# Patient Record
Sex: Female | Born: 1940 | Race: Black or African American | Hispanic: No | Smoking: Former smoker
Health system: Southern US, Community
[De-identification: ages and names within clinical notes are randomized; demographics above are authoritative.]

## PROBLEM LIST (undated history)

## (undated) DIAGNOSIS — I480 Paroxysmal atrial fibrillation: Secondary | ICD-10-CM

## (undated) DIAGNOSIS — K219 Gastro-esophageal reflux disease without esophagitis: Secondary | ICD-10-CM

## (undated) DIAGNOSIS — F329 Major depressive disorder, single episode, unspecified: Secondary | ICD-10-CM

## (undated) DIAGNOSIS — I10 Essential (primary) hypertension: Secondary | ICD-10-CM

## (undated) DIAGNOSIS — F32A Depression, unspecified: Secondary | ICD-10-CM

## (undated) DIAGNOSIS — I50810 Right heart failure, unspecified: Secondary | ICD-10-CM

## (undated) HISTORY — PX: HERNIA REPAIR: SHX51

---

## 2014-01-18 DIAGNOSIS — I10 Essential (primary) hypertension: Secondary | ICD-10-CM | POA: Diagnosis not present

## 2014-01-18 DIAGNOSIS — K210 Gastro-esophageal reflux disease with esophagitis: Secondary | ICD-10-CM | POA: Diagnosis not present

## 2014-01-18 DIAGNOSIS — G990 Autonomic neuropathy in diseases classified elsewhere: Secondary | ICD-10-CM | POA: Diagnosis not present

## 2014-01-18 DIAGNOSIS — M069 Rheumatoid arthritis, unspecified: Secondary | ICD-10-CM | POA: Diagnosis not present

## 2014-01-18 DIAGNOSIS — E0590 Thyrotoxicosis, unspecified without thyrotoxic crisis or storm: Secondary | ICD-10-CM | POA: Diagnosis not present

## 2014-01-28 DIAGNOSIS — I10 Essential (primary) hypertension: Secondary | ICD-10-CM | POA: Diagnosis not present

## 2014-01-28 DIAGNOSIS — R609 Edema, unspecified: Secondary | ICD-10-CM | POA: Diagnosis not present

## 2014-01-28 DIAGNOSIS — M069 Rheumatoid arthritis, unspecified: Secondary | ICD-10-CM | POA: Diagnosis not present

## 2014-01-28 DIAGNOSIS — K219 Gastro-esophageal reflux disease without esophagitis: Secondary | ICD-10-CM | POA: Diagnosis not present

## 2014-01-28 DIAGNOSIS — J45909 Unspecified asthma, uncomplicated: Secondary | ICD-10-CM | POA: Diagnosis not present

## 2014-01-28 DIAGNOSIS — E559 Vitamin D deficiency, unspecified: Secondary | ICD-10-CM | POA: Diagnosis not present

## 2014-01-28 DIAGNOSIS — E0590 Thyrotoxicosis, unspecified without thyrotoxic crisis or storm: Secondary | ICD-10-CM | POA: Diagnosis not present

## 2014-01-28 DIAGNOSIS — G990 Autonomic neuropathy in diseases classified elsewhere: Secondary | ICD-10-CM | POA: Diagnosis not present

## 2014-04-19 DIAGNOSIS — G990 Autonomic neuropathy in diseases classified elsewhere: Secondary | ICD-10-CM | POA: Diagnosis not present

## 2014-04-19 DIAGNOSIS — J45909 Unspecified asthma, uncomplicated: Secondary | ICD-10-CM | POA: Diagnosis not present

## 2014-04-19 DIAGNOSIS — E0590 Thyrotoxicosis, unspecified without thyrotoxic crisis or storm: Secondary | ICD-10-CM | POA: Diagnosis not present

## 2014-04-19 DIAGNOSIS — E785 Hyperlipidemia, unspecified: Secondary | ICD-10-CM | POA: Diagnosis not present

## 2014-04-19 DIAGNOSIS — E559 Vitamin D deficiency, unspecified: Secondary | ICD-10-CM | POA: Diagnosis not present

## 2014-04-21 DIAGNOSIS — G4700 Insomnia, unspecified: Secondary | ICD-10-CM | POA: Diagnosis not present

## 2014-04-21 DIAGNOSIS — E559 Vitamin D deficiency, unspecified: Secondary | ICD-10-CM | POA: Diagnosis not present

## 2014-04-21 DIAGNOSIS — R609 Edema, unspecified: Secondary | ICD-10-CM | POA: Diagnosis not present

## 2014-04-21 DIAGNOSIS — G990 Autonomic neuropathy in diseases classified elsewhere: Secondary | ICD-10-CM | POA: Diagnosis not present

## 2014-04-21 DIAGNOSIS — E785 Hyperlipidemia, unspecified: Secondary | ICD-10-CM | POA: Diagnosis not present

## 2014-04-21 DIAGNOSIS — I10 Essential (primary) hypertension: Secondary | ICD-10-CM | POA: Diagnosis not present

## 2014-05-07 DIAGNOSIS — H524 Presbyopia: Secondary | ICD-10-CM | POA: Diagnosis not present

## 2014-05-07 DIAGNOSIS — H1712 Central corneal opacity, left eye: Secondary | ICD-10-CM | POA: Diagnosis not present

## 2014-07-26 DIAGNOSIS — K210 Gastro-esophageal reflux disease with esophagitis: Secondary | ICD-10-CM | POA: Diagnosis not present

## 2014-07-26 DIAGNOSIS — R609 Edema, unspecified: Secondary | ICD-10-CM | POA: Diagnosis not present

## 2014-07-26 DIAGNOSIS — M069 Rheumatoid arthritis, unspecified: Secondary | ICD-10-CM | POA: Diagnosis not present

## 2014-07-26 DIAGNOSIS — E0590 Thyrotoxicosis, unspecified without thyrotoxic crisis or storm: Secondary | ICD-10-CM | POA: Diagnosis not present

## 2014-07-26 DIAGNOSIS — G4700 Insomnia, unspecified: Secondary | ICD-10-CM | POA: Diagnosis not present

## 2014-07-29 DIAGNOSIS — G4700 Insomnia, unspecified: Secondary | ICD-10-CM | POA: Diagnosis not present

## 2014-07-29 DIAGNOSIS — E0590 Thyrotoxicosis, unspecified without thyrotoxic crisis or storm: Secondary | ICD-10-CM | POA: Diagnosis not present

## 2014-07-29 DIAGNOSIS — M069 Rheumatoid arthritis, unspecified: Secondary | ICD-10-CM | POA: Diagnosis not present

## 2014-07-29 DIAGNOSIS — I10 Essential (primary) hypertension: Secondary | ICD-10-CM | POA: Diagnosis not present

## 2014-07-29 DIAGNOSIS — R609 Edema, unspecified: Secondary | ICD-10-CM | POA: Diagnosis not present

## 2014-07-29 DIAGNOSIS — A210 Ulceroglandular tularemia: Secondary | ICD-10-CM | POA: Diagnosis not present

## 2014-10-28 DIAGNOSIS — I10 Essential (primary) hypertension: Secondary | ICD-10-CM | POA: Diagnosis not present

## 2014-10-28 DIAGNOSIS — M25519 Pain in unspecified shoulder: Secondary | ICD-10-CM | POA: Diagnosis not present

## 2014-10-28 DIAGNOSIS — M069 Rheumatoid arthritis, unspecified: Secondary | ICD-10-CM | POA: Diagnosis not present

## 2014-10-28 DIAGNOSIS — J45909 Unspecified asthma, uncomplicated: Secondary | ICD-10-CM | POA: Diagnosis not present

## 2014-10-28 DIAGNOSIS — G990 Autonomic neuropathy in diseases classified elsewhere: Secondary | ICD-10-CM | POA: Diagnosis not present

## 2015-01-28 DIAGNOSIS — M069 Rheumatoid arthritis, unspecified: Secondary | ICD-10-CM | POA: Diagnosis not present

## 2015-01-28 DIAGNOSIS — E0590 Thyrotoxicosis, unspecified without thyrotoxic crisis or storm: Secondary | ICD-10-CM | POA: Diagnosis not present

## 2015-01-28 DIAGNOSIS — E785 Hyperlipidemia, unspecified: Secondary | ICD-10-CM | POA: Diagnosis not present

## 2015-01-28 DIAGNOSIS — M25519 Pain in unspecified shoulder: Secondary | ICD-10-CM | POA: Diagnosis not present

## 2015-01-28 DIAGNOSIS — I10 Essential (primary) hypertension: Secondary | ICD-10-CM | POA: Diagnosis not present

## 2015-01-28 DIAGNOSIS — K210 Gastro-esophageal reflux disease with esophagitis: Secondary | ICD-10-CM | POA: Diagnosis not present

## 2015-04-28 DIAGNOSIS — E669 Obesity, unspecified: Secondary | ICD-10-CM | POA: Diagnosis not present

## 2015-04-28 DIAGNOSIS — G990 Autonomic neuropathy in diseases classified elsewhere: Secondary | ICD-10-CM | POA: Diagnosis not present

## 2015-04-28 DIAGNOSIS — E785 Hyperlipidemia, unspecified: Secondary | ICD-10-CM | POA: Diagnosis not present

## 2015-04-28 DIAGNOSIS — I509 Heart failure, unspecified: Secondary | ICD-10-CM | POA: Diagnosis not present

## 2015-04-28 DIAGNOSIS — E0590 Thyrotoxicosis, unspecified without thyrotoxic crisis or storm: Secondary | ICD-10-CM | POA: Diagnosis not present

## 2015-04-28 DIAGNOSIS — I10 Essential (primary) hypertension: Secondary | ICD-10-CM | POA: Diagnosis not present

## 2015-07-28 DIAGNOSIS — I10 Essential (primary) hypertension: Secondary | ICD-10-CM | POA: Diagnosis not present

## 2015-07-28 DIAGNOSIS — K210 Gastro-esophageal reflux disease with esophagitis: Secondary | ICD-10-CM | POA: Diagnosis not present

## 2015-07-28 DIAGNOSIS — E559 Vitamin D deficiency, unspecified: Secondary | ICD-10-CM | POA: Diagnosis not present

## 2015-07-28 DIAGNOSIS — Z79899 Other long term (current) drug therapy: Secondary | ICD-10-CM | POA: Diagnosis not present

## 2015-07-28 DIAGNOSIS — G990 Autonomic neuropathy in diseases classified elsewhere: Secondary | ICD-10-CM | POA: Diagnosis not present

## 2015-07-28 DIAGNOSIS — M069 Rheumatoid arthritis, unspecified: Secondary | ICD-10-CM | POA: Diagnosis not present

## 2015-07-28 DIAGNOSIS — E0590 Thyrotoxicosis, unspecified without thyrotoxic crisis or storm: Secondary | ICD-10-CM | POA: Diagnosis not present

## 2015-07-28 DIAGNOSIS — I509 Heart failure, unspecified: Secondary | ICD-10-CM | POA: Diagnosis not present

## 2015-10-28 DIAGNOSIS — I10 Essential (primary) hypertension: Secondary | ICD-10-CM | POA: Diagnosis not present

## 2015-10-28 DIAGNOSIS — E785 Hyperlipidemia, unspecified: Secondary | ICD-10-CM | POA: Diagnosis not present

## 2015-10-28 DIAGNOSIS — E559 Vitamin D deficiency, unspecified: Secondary | ICD-10-CM | POA: Diagnosis not present

## 2015-10-28 DIAGNOSIS — E0590 Thyrotoxicosis, unspecified without thyrotoxic crisis or storm: Secondary | ICD-10-CM | POA: Diagnosis not present

## 2015-10-28 DIAGNOSIS — E538 Deficiency of other specified B group vitamins: Secondary | ICD-10-CM | POA: Diagnosis not present

## 2015-10-28 DIAGNOSIS — G4700 Insomnia, unspecified: Secondary | ICD-10-CM | POA: Diagnosis not present

## 2015-10-28 DIAGNOSIS — M069 Rheumatoid arthritis, unspecified: Secondary | ICD-10-CM | POA: Diagnosis not present

## 2015-11-12 DIAGNOSIS — R1031 Right lower quadrant pain: Secondary | ICD-10-CM | POA: Diagnosis not present

## 2015-11-12 DIAGNOSIS — I10 Essential (primary) hypertension: Secondary | ICD-10-CM | POA: Diagnosis not present

## 2015-11-12 DIAGNOSIS — R1110 Vomiting, unspecified: Secondary | ICD-10-CM | POA: Diagnosis not present

## 2015-11-12 DIAGNOSIS — R1032 Left lower quadrant pain: Secondary | ICD-10-CM | POA: Diagnosis not present

## 2015-11-12 DIAGNOSIS — Z79899 Other long term (current) drug therapy: Secondary | ICD-10-CM | POA: Diagnosis not present

## 2015-11-12 DIAGNOSIS — M069 Rheumatoid arthritis, unspecified: Secondary | ICD-10-CM | POA: Diagnosis not present

## 2015-11-12 DIAGNOSIS — J449 Chronic obstructive pulmonary disease, unspecified: Secondary | ICD-10-CM | POA: Diagnosis not present

## 2015-11-12 DIAGNOSIS — K56699 Other intestinal obstruction unspecified as to partial versus complete obstruction: Secondary | ICD-10-CM | POA: Diagnosis not present

## 2015-11-12 DIAGNOSIS — K56609 Unspecified intestinal obstruction, unspecified as to partial versus complete obstruction: Secondary | ICD-10-CM | POA: Diagnosis not present

## 2015-11-12 DIAGNOSIS — K4030 Unilateral inguinal hernia, with obstruction, without gangrene, not specified as recurrent: Secondary | ICD-10-CM | POA: Diagnosis not present

## 2015-11-13 DIAGNOSIS — K56609 Unspecified intestinal obstruction, unspecified as to partial versus complete obstruction: Secondary | ICD-10-CM | POA: Diagnosis not present

## 2015-11-13 DIAGNOSIS — M069 Rheumatoid arthritis, unspecified: Secondary | ICD-10-CM | POA: Diagnosis not present

## 2015-11-13 DIAGNOSIS — K4030 Unilateral inguinal hernia, with obstruction, without gangrene, not specified as recurrent: Secondary | ICD-10-CM | POA: Diagnosis not present

## 2016-01-31 DIAGNOSIS — E559 Vitamin D deficiency, unspecified: Secondary | ICD-10-CM | POA: Diagnosis not present

## 2016-01-31 DIAGNOSIS — Z79899 Other long term (current) drug therapy: Secondary | ICD-10-CM | POA: Diagnosis not present

## 2016-01-31 DIAGNOSIS — E538 Deficiency of other specified B group vitamins: Secondary | ICD-10-CM | POA: Diagnosis not present

## 2016-01-31 DIAGNOSIS — Z1389 Encounter for screening for other disorder: Secondary | ICD-10-CM | POA: Diagnosis not present

## 2016-01-31 DIAGNOSIS — R002 Palpitations: Secondary | ICD-10-CM | POA: Diagnosis not present

## 2016-01-31 DIAGNOSIS — I10 Essential (primary) hypertension: Secondary | ICD-10-CM | POA: Diagnosis not present

## 2016-01-31 DIAGNOSIS — D649 Anemia, unspecified: Secondary | ICD-10-CM | POA: Diagnosis not present

## 2016-01-31 DIAGNOSIS — E0590 Thyrotoxicosis, unspecified without thyrotoxic crisis or storm: Secondary | ICD-10-CM | POA: Diagnosis not present

## 2016-01-31 DIAGNOSIS — M069 Rheumatoid arthritis, unspecified: Secondary | ICD-10-CM | POA: Diagnosis not present

## 2016-01-31 DIAGNOSIS — Z9181 History of falling: Secondary | ICD-10-CM | POA: Diagnosis not present

## 2016-02-01 DIAGNOSIS — R600 Localized edema: Secondary | ICD-10-CM | POA: Diagnosis not present

## 2016-02-01 DIAGNOSIS — R011 Cardiac murmur, unspecified: Secondary | ICD-10-CM | POA: Diagnosis not present

## 2016-02-01 DIAGNOSIS — Z8679 Personal history of other diseases of the circulatory system: Secondary | ICD-10-CM | POA: Diagnosis not present

## 2016-02-07 DIAGNOSIS — Z1231 Encounter for screening mammogram for malignant neoplasm of breast: Secondary | ICD-10-CM | POA: Diagnosis not present

## 2016-02-09 DIAGNOSIS — I4891 Unspecified atrial fibrillation: Secondary | ICD-10-CM | POA: Diagnosis not present

## 2016-02-09 DIAGNOSIS — R011 Cardiac murmur, unspecified: Secondary | ICD-10-CM | POA: Diagnosis not present

## 2016-02-13 DIAGNOSIS — Z8679 Personal history of other diseases of the circulatory system: Secondary | ICD-10-CM | POA: Diagnosis not present

## 2016-02-14 DIAGNOSIS — R922 Inconclusive mammogram: Secondary | ICD-10-CM | POA: Diagnosis not present

## 2016-02-14 DIAGNOSIS — N6310 Unspecified lump in the right breast, unspecified quadrant: Secondary | ICD-10-CM | POA: Diagnosis not present

## 2016-02-14 DIAGNOSIS — R928 Other abnormal and inconclusive findings on diagnostic imaging of breast: Secondary | ICD-10-CM | POA: Diagnosis not present

## 2016-02-16 DIAGNOSIS — D649 Anemia, unspecified: Secondary | ICD-10-CM | POA: Diagnosis not present

## 2016-02-16 DIAGNOSIS — K2970 Gastritis, unspecified, without bleeding: Secondary | ICD-10-CM | POA: Diagnosis not present

## 2016-02-16 DIAGNOSIS — Z79899 Other long term (current) drug therapy: Secondary | ICD-10-CM | POA: Diagnosis not present

## 2016-02-16 DIAGNOSIS — I4891 Unspecified atrial fibrillation: Secondary | ICD-10-CM | POA: Diagnosis not present

## 2016-02-16 DIAGNOSIS — E0590 Thyrotoxicosis, unspecified without thyrotoxic crisis or storm: Secondary | ICD-10-CM | POA: Diagnosis not present

## 2016-02-21 DIAGNOSIS — R600 Localized edema: Secondary | ICD-10-CM | POA: Diagnosis not present

## 2016-02-21 DIAGNOSIS — Z8679 Personal history of other diseases of the circulatory system: Secondary | ICD-10-CM | POA: Diagnosis not present

## 2016-02-22 DIAGNOSIS — N6311 Unspecified lump in the right breast, upper outer quadrant: Secondary | ICD-10-CM | POA: Diagnosis not present

## 2016-02-22 DIAGNOSIS — N6011 Diffuse cystic mastopathy of right breast: Secondary | ICD-10-CM | POA: Diagnosis not present

## 2016-02-22 DIAGNOSIS — R590 Localized enlarged lymph nodes: Secondary | ICD-10-CM | POA: Diagnosis not present

## 2016-02-29 DIAGNOSIS — Z79899 Other long term (current) drug therapy: Secondary | ICD-10-CM | POA: Diagnosis not present

## 2016-02-29 DIAGNOSIS — D649 Anemia, unspecified: Secondary | ICD-10-CM | POA: Diagnosis not present

## 2016-02-29 DIAGNOSIS — N6029 Fibroadenosis of unspecified breast: Secondary | ICD-10-CM | POA: Diagnosis not present

## 2016-02-29 DIAGNOSIS — E0590 Thyrotoxicosis, unspecified without thyrotoxic crisis or storm: Secondary | ICD-10-CM | POA: Diagnosis not present

## 2016-02-29 DIAGNOSIS — M069 Rheumatoid arthritis, unspecified: Secondary | ICD-10-CM | POA: Diagnosis not present

## 2016-03-01 DIAGNOSIS — D649 Anemia, unspecified: Secondary | ICD-10-CM | POA: Diagnosis not present

## 2016-03-02 DIAGNOSIS — D649 Anemia, unspecified: Secondary | ICD-10-CM | POA: Diagnosis not present

## 2016-03-05 DIAGNOSIS — N6310 Unspecified lump in the right breast, unspecified quadrant: Secondary | ICD-10-CM | POA: Diagnosis not present

## 2016-03-09 DIAGNOSIS — E538 Deficiency of other specified B group vitamins: Secondary | ICD-10-CM | POA: Diagnosis not present

## 2016-03-09 DIAGNOSIS — E039 Hypothyroidism, unspecified: Secondary | ICD-10-CM | POA: Diagnosis not present

## 2016-03-09 DIAGNOSIS — I499 Cardiac arrhythmia, unspecified: Secondary | ICD-10-CM | POA: Diagnosis not present

## 2016-03-09 DIAGNOSIS — D649 Anemia, unspecified: Secondary | ICD-10-CM | POA: Diagnosis not present

## 2016-03-09 DIAGNOSIS — M069 Rheumatoid arthritis, unspecified: Secondary | ICD-10-CM | POA: Diagnosis not present

## 2016-03-14 DIAGNOSIS — E785 Hyperlipidemia, unspecified: Secondary | ICD-10-CM | POA: Diagnosis not present

## 2016-03-14 DIAGNOSIS — E538 Deficiency of other specified B group vitamins: Secondary | ICD-10-CM | POA: Diagnosis not present

## 2016-03-14 DIAGNOSIS — M069 Rheumatoid arthritis, unspecified: Secondary | ICD-10-CM | POA: Diagnosis not present

## 2016-03-14 DIAGNOSIS — D500 Iron deficiency anemia secondary to blood loss (chronic): Secondary | ICD-10-CM | POA: Diagnosis not present

## 2016-03-14 DIAGNOSIS — E0590 Thyrotoxicosis, unspecified without thyrotoxic crisis or storm: Secondary | ICD-10-CM | POA: Diagnosis not present

## 2016-03-14 DIAGNOSIS — I10 Essential (primary) hypertension: Secondary | ICD-10-CM | POA: Diagnosis not present

## 2016-03-16 DIAGNOSIS — E0590 Thyrotoxicosis, unspecified without thyrotoxic crisis or storm: Secondary | ICD-10-CM | POA: Diagnosis not present

## 2016-03-16 DIAGNOSIS — D649 Anemia, unspecified: Secondary | ICD-10-CM | POA: Diagnosis not present

## 2016-03-16 DIAGNOSIS — M069 Rheumatoid arthritis, unspecified: Secondary | ICD-10-CM | POA: Diagnosis not present

## 2016-04-02 DIAGNOSIS — E538 Deficiency of other specified B group vitamins: Secondary | ICD-10-CM | POA: Diagnosis not present

## 2016-04-02 DIAGNOSIS — E0590 Thyrotoxicosis, unspecified without thyrotoxic crisis or storm: Secondary | ICD-10-CM | POA: Diagnosis not present

## 2016-04-02 DIAGNOSIS — I4891 Unspecified atrial fibrillation: Secondary | ICD-10-CM | POA: Diagnosis not present

## 2016-04-02 DIAGNOSIS — G990 Autonomic neuropathy in diseases classified elsewhere: Secondary | ICD-10-CM | POA: Diagnosis not present

## 2016-04-02 DIAGNOSIS — M069 Rheumatoid arthritis, unspecified: Secondary | ICD-10-CM | POA: Diagnosis not present

## 2016-04-16 DIAGNOSIS — D649 Anemia, unspecified: Secondary | ICD-10-CM | POA: Diagnosis not present

## 2016-04-19 ENCOUNTER — Other Ambulatory Visit (HOSPITAL_COMMUNITY)
Admission: RE | Admit: 2016-04-19 | Discharge: 2016-04-19 | Disposition: A | Discharge status: Home / Self Care | Payer: Self-pay | Source: Other Acute Inpatient Hospital | Attending: Oncology | Admitting: Oncology

## 2016-04-19 DIAGNOSIS — D72822 Plasmacytosis: Secondary | ICD-10-CM | POA: Diagnosis not present

## 2016-04-19 DIAGNOSIS — D464 Refractory anemia, unspecified: Secondary | ICD-10-CM | POA: Diagnosis not present

## 2016-04-19 DIAGNOSIS — D649 Anemia, unspecified: Secondary | ICD-10-CM | POA: Diagnosis not present

## 2016-04-27 DIAGNOSIS — E0590 Thyrotoxicosis, unspecified without thyrotoxic crisis or storm: Secondary | ICD-10-CM | POA: Diagnosis not present

## 2016-04-27 DIAGNOSIS — D464 Refractory anemia, unspecified: Secondary | ICD-10-CM | POA: Diagnosis not present

## 2016-04-30 LAB — CHROMOSOME ANALYSIS, BONE MARROW

## 2016-05-02 DIAGNOSIS — E785 Hyperlipidemia, unspecified: Secondary | ICD-10-CM | POA: Diagnosis not present

## 2016-05-02 DIAGNOSIS — E538 Deficiency of other specified B group vitamins: Secondary | ICD-10-CM | POA: Diagnosis not present

## 2016-05-02 DIAGNOSIS — E0590 Thyrotoxicosis, unspecified without thyrotoxic crisis or storm: Secondary | ICD-10-CM | POA: Diagnosis not present

## 2016-05-02 DIAGNOSIS — I50810 Right heart failure, unspecified: Secondary | ICD-10-CM | POA: Diagnosis not present

## 2016-05-02 DIAGNOSIS — I10 Essential (primary) hypertension: Secondary | ICD-10-CM | POA: Diagnosis not present

## 2016-05-02 DIAGNOSIS — M069 Rheumatoid arthritis, unspecified: Secondary | ICD-10-CM | POA: Diagnosis not present

## 2016-05-04 DIAGNOSIS — D464 Refractory anemia, unspecified: Secondary | ICD-10-CM | POA: Diagnosis not present

## 2016-05-10 DIAGNOSIS — E0590 Thyrotoxicosis, unspecified without thyrotoxic crisis or storm: Secondary | ICD-10-CM | POA: Diagnosis not present

## 2016-05-11 DIAGNOSIS — E0590 Thyrotoxicosis, unspecified without thyrotoxic crisis or storm: Secondary | ICD-10-CM | POA: Diagnosis not present

## 2016-05-18 DIAGNOSIS — D464 Refractory anemia, unspecified: Secondary | ICD-10-CM | POA: Diagnosis not present

## 2016-05-21 DIAGNOSIS — I10 Essential (primary) hypertension: Secondary | ICD-10-CM | POA: Diagnosis not present

## 2016-05-21 DIAGNOSIS — Z8679 Personal history of other diseases of the circulatory system: Secondary | ICD-10-CM | POA: Diagnosis not present

## 2016-05-25 DIAGNOSIS — J45909 Unspecified asthma, uncomplicated: Secondary | ICD-10-CM | POA: Diagnosis not present

## 2016-05-25 DIAGNOSIS — E0590 Thyrotoxicosis, unspecified without thyrotoxic crisis or storm: Secondary | ICD-10-CM | POA: Diagnosis not present

## 2016-05-25 DIAGNOSIS — M069 Rheumatoid arthritis, unspecified: Secondary | ICD-10-CM | POA: Diagnosis not present

## 2016-05-25 DIAGNOSIS — I4891 Unspecified atrial fibrillation: Secondary | ICD-10-CM | POA: Diagnosis not present

## 2016-05-25 DIAGNOSIS — I50810 Right heart failure, unspecified: Secondary | ICD-10-CM | POA: Diagnosis not present

## 2016-05-25 DIAGNOSIS — D649 Anemia, unspecified: Secondary | ICD-10-CM | POA: Diagnosis not present

## 2016-06-01 DIAGNOSIS — D464 Refractory anemia, unspecified: Secondary | ICD-10-CM | POA: Diagnosis not present

## 2016-06-20 DIAGNOSIS — M069 Rheumatoid arthritis, unspecified: Secondary | ICD-10-CM | POA: Diagnosis not present

## 2016-06-20 DIAGNOSIS — I10 Essential (primary) hypertension: Secondary | ICD-10-CM | POA: Diagnosis not present

## 2016-06-20 DIAGNOSIS — I4891 Unspecified atrial fibrillation: Secondary | ICD-10-CM | POA: Diagnosis not present

## 2016-06-20 DIAGNOSIS — E0590 Thyrotoxicosis, unspecified without thyrotoxic crisis or storm: Secondary | ICD-10-CM | POA: Diagnosis not present

## 2016-06-29 DIAGNOSIS — D464 Refractory anemia, unspecified: Secondary | ICD-10-CM | POA: Diagnosis not present

## 2016-06-30 DIAGNOSIS — R030 Elevated blood-pressure reading, without diagnosis of hypertension: Secondary | ICD-10-CM | POA: Diagnosis not present

## 2016-06-30 DIAGNOSIS — M79606 Pain in leg, unspecified: Secondary | ICD-10-CM | POA: Diagnosis not present

## 2016-06-30 DIAGNOSIS — J189 Pneumonia, unspecified organism: Secondary | ICD-10-CM | POA: Diagnosis not present

## 2016-06-30 DIAGNOSIS — N289 Disorder of kidney and ureter, unspecified: Secondary | ICD-10-CM | POA: Diagnosis not present

## 2016-06-30 DIAGNOSIS — R531 Weakness: Secondary | ICD-10-CM | POA: Diagnosis not present

## 2016-06-30 DIAGNOSIS — J181 Lobar pneumonia, unspecified organism: Secondary | ICD-10-CM | POA: Diagnosis not present

## 2016-06-30 DIAGNOSIS — E860 Dehydration: Secondary | ICD-10-CM | POA: Diagnosis not present

## 2016-07-03 DIAGNOSIS — I10 Essential (primary) hypertension: Secondary | ICD-10-CM | POA: Diagnosis not present

## 2016-07-03 DIAGNOSIS — M069 Rheumatoid arthritis, unspecified: Secondary | ICD-10-CM | POA: Diagnosis not present

## 2016-07-03 DIAGNOSIS — M79605 Pain in left leg: Secondary | ICD-10-CM | POA: Diagnosis not present

## 2016-07-03 DIAGNOSIS — M7989 Other specified soft tissue disorders: Secondary | ICD-10-CM | POA: Diagnosis not present

## 2016-07-03 DIAGNOSIS — I824Z2 Acute embolism and thrombosis of unspecified deep veins of left distal lower extremity: Secondary | ICD-10-CM | POA: Diagnosis not present

## 2016-07-03 DIAGNOSIS — K210 Gastro-esophageal reflux disease with esophagitis: Secondary | ICD-10-CM | POA: Diagnosis not present

## 2016-07-06 DIAGNOSIS — D649 Anemia, unspecified: Secondary | ICD-10-CM | POA: Diagnosis not present

## 2016-07-06 DIAGNOSIS — I82409 Acute embolism and thrombosis of unspecified deep veins of unspecified lower extremity: Secondary | ICD-10-CM | POA: Diagnosis not present

## 2016-07-06 DIAGNOSIS — D500 Iron deficiency anemia secondary to blood loss (chronic): Secondary | ICD-10-CM | POA: Diagnosis not present

## 2016-07-06 DIAGNOSIS — I4891 Unspecified atrial fibrillation: Secondary | ICD-10-CM | POA: Diagnosis not present

## 2016-07-06 DIAGNOSIS — N179 Acute kidney failure, unspecified: Secondary | ICD-10-CM | POA: Diagnosis not present

## 2016-07-09 DIAGNOSIS — D649 Anemia, unspecified: Secondary | ICD-10-CM | POA: Diagnosis not present

## 2016-07-10 DIAGNOSIS — D649 Anemia, unspecified: Secondary | ICD-10-CM | POA: Diagnosis not present

## 2016-07-13 DIAGNOSIS — I82409 Acute embolism and thrombosis of unspecified deep veins of unspecified lower extremity: Secondary | ICD-10-CM | POA: Diagnosis not present

## 2016-07-13 DIAGNOSIS — M069 Rheumatoid arthritis, unspecified: Secondary | ICD-10-CM | POA: Diagnosis not present

## 2016-07-30 DIAGNOSIS — E0590 Thyrotoxicosis, unspecified without thyrotoxic crisis or storm: Secondary | ICD-10-CM | POA: Diagnosis not present

## 2016-07-30 DIAGNOSIS — I82409 Acute embolism and thrombosis of unspecified deep veins of unspecified lower extremity: Secondary | ICD-10-CM | POA: Diagnosis not present

## 2016-07-30 DIAGNOSIS — I4891 Unspecified atrial fibrillation: Secondary | ICD-10-CM | POA: Diagnosis not present

## 2016-07-30 DIAGNOSIS — M069 Rheumatoid arthritis, unspecified: Secondary | ICD-10-CM | POA: Diagnosis not present

## 2016-07-30 DIAGNOSIS — E785 Hyperlipidemia, unspecified: Secondary | ICD-10-CM | POA: Diagnosis not present

## 2016-07-30 DIAGNOSIS — I50810 Right heart failure, unspecified: Secondary | ICD-10-CM | POA: Diagnosis not present

## 2016-08-31 DIAGNOSIS — M069 Rheumatoid arthritis, unspecified: Secondary | ICD-10-CM | POA: Diagnosis not present

## 2016-08-31 DIAGNOSIS — E0590 Thyrotoxicosis, unspecified without thyrotoxic crisis or storm: Secondary | ICD-10-CM | POA: Diagnosis not present

## 2016-08-31 DIAGNOSIS — I82409 Acute embolism and thrombosis of unspecified deep veins of unspecified lower extremity: Secondary | ICD-10-CM | POA: Diagnosis not present

## 2016-08-31 DIAGNOSIS — I4891 Unspecified atrial fibrillation: Secondary | ICD-10-CM | POA: Diagnosis not present

## 2016-08-31 DIAGNOSIS — K2970 Gastritis, unspecified, without bleeding: Secondary | ICD-10-CM | POA: Diagnosis not present

## 2016-09-27 DIAGNOSIS — D464 Refractory anemia, unspecified: Secondary | ICD-10-CM | POA: Diagnosis not present

## 2016-09-27 DIAGNOSIS — Z86718 Personal history of other venous thrombosis and embolism: Secondary | ICD-10-CM | POA: Diagnosis not present

## 2016-09-27 DIAGNOSIS — R531 Weakness: Secondary | ICD-10-CM | POA: Diagnosis not present

## 2016-11-07 DIAGNOSIS — M069 Rheumatoid arthritis, unspecified: Secondary | ICD-10-CM | POA: Diagnosis not present

## 2016-11-07 DIAGNOSIS — E0590 Thyrotoxicosis, unspecified without thyrotoxic crisis or storm: Secondary | ICD-10-CM | POA: Diagnosis not present

## 2016-11-07 DIAGNOSIS — E785 Hyperlipidemia, unspecified: Secondary | ICD-10-CM | POA: Diagnosis not present

## 2016-11-07 DIAGNOSIS — K2970 Gastritis, unspecified, without bleeding: Secondary | ICD-10-CM | POA: Diagnosis not present

## 2016-11-07 DIAGNOSIS — D649 Anemia, unspecified: Secondary | ICD-10-CM | POA: Diagnosis not present

## 2016-11-07 DIAGNOSIS — I50810 Right heart failure, unspecified: Secondary | ICD-10-CM | POA: Diagnosis not present

## 2016-12-13 DIAGNOSIS — I10 Essential (primary) hypertension: Secondary | ICD-10-CM | POA: Diagnosis not present

## 2016-12-13 DIAGNOSIS — E785 Hyperlipidemia, unspecified: Secondary | ICD-10-CM | POA: Diagnosis not present

## 2016-12-13 DIAGNOSIS — I480 Paroxysmal atrial fibrillation: Secondary | ICD-10-CM | POA: Diagnosis not present

## 2016-12-13 DIAGNOSIS — Z7901 Long term (current) use of anticoagulants: Secondary | ICD-10-CM | POA: Diagnosis not present

## 2016-12-27 DIAGNOSIS — D464 Refractory anemia, unspecified: Secondary | ICD-10-CM | POA: Diagnosis not present

## 2017-01-18 DIAGNOSIS — K5731 Diverticulosis of large intestine without perforation or abscess with bleeding: Secondary | ICD-10-CM | POA: Diagnosis not present

## 2017-01-18 DIAGNOSIS — I482 Chronic atrial fibrillation: Secondary | ICD-10-CM | POA: Diagnosis not present

## 2017-01-18 DIAGNOSIS — D649 Anemia, unspecified: Secondary | ICD-10-CM | POA: Diagnosis not present

## 2017-01-18 DIAGNOSIS — D126 Benign neoplasm of colon, unspecified: Secondary | ICD-10-CM | POA: Diagnosis not present

## 2017-01-18 DIAGNOSIS — K5790 Diverticulosis of intestine, part unspecified, without perforation or abscess without bleeding: Secondary | ICD-10-CM | POA: Diagnosis not present

## 2017-01-18 DIAGNOSIS — D62 Acute posthemorrhagic anemia: Secondary | ICD-10-CM | POA: Diagnosis not present

## 2017-01-18 DIAGNOSIS — K921 Melena: Secondary | ICD-10-CM | POA: Diagnosis not present

## 2017-01-18 DIAGNOSIS — K4090 Unilateral inguinal hernia, without obstruction or gangrene, not specified as recurrent: Secondary | ICD-10-CM | POA: Diagnosis not present

## 2017-01-18 DIAGNOSIS — R197 Diarrhea, unspecified: Secondary | ICD-10-CM | POA: Diagnosis not present

## 2017-01-19 DIAGNOSIS — K219 Gastro-esophageal reflux disease without esophagitis: Secondary | ICD-10-CM | POA: Diagnosis not present

## 2017-01-19 DIAGNOSIS — K5731 Diverticulosis of large intestine without perforation or abscess with bleeding: Secondary | ICD-10-CM | POA: Diagnosis not present

## 2017-01-19 DIAGNOSIS — K4090 Unilateral inguinal hernia, without obstruction or gangrene, not specified as recurrent: Secondary | ICD-10-CM | POA: Diagnosis not present

## 2017-01-19 DIAGNOSIS — R197 Diarrhea, unspecified: Secondary | ICD-10-CM | POA: Diagnosis not present

## 2017-01-19 DIAGNOSIS — I509 Heart failure, unspecified: Secondary | ICD-10-CM | POA: Diagnosis not present

## 2017-01-19 DIAGNOSIS — K921 Melena: Secondary | ICD-10-CM | POA: Diagnosis not present

## 2017-01-19 DIAGNOSIS — D126 Benign neoplasm of colon, unspecified: Secondary | ICD-10-CM | POA: Diagnosis not present

## 2017-01-19 DIAGNOSIS — K5790 Diverticulosis of intestine, part unspecified, without perforation or abscess without bleeding: Secondary | ICD-10-CM | POA: Diagnosis not present

## 2017-01-19 DIAGNOSIS — I110 Hypertensive heart disease with heart failure: Secondary | ICD-10-CM | POA: Diagnosis not present

## 2017-01-19 DIAGNOSIS — Z880 Allergy status to penicillin: Secondary | ICD-10-CM | POA: Diagnosis not present

## 2017-01-19 DIAGNOSIS — I480 Paroxysmal atrial fibrillation: Secondary | ICD-10-CM | POA: Diagnosis not present

## 2017-01-19 DIAGNOSIS — D649 Anemia, unspecified: Secondary | ICD-10-CM | POA: Diagnosis not present

## 2017-01-19 DIAGNOSIS — Z86718 Personal history of other venous thrombosis and embolism: Secondary | ICD-10-CM | POA: Diagnosis not present

## 2017-01-19 DIAGNOSIS — E7800 Pure hypercholesterolemia, unspecified: Secondary | ICD-10-CM | POA: Diagnosis not present

## 2017-01-19 DIAGNOSIS — D638 Anemia in other chronic diseases classified elsewhere: Secondary | ICD-10-CM | POA: Diagnosis not present

## 2017-01-19 DIAGNOSIS — D122 Benign neoplasm of ascending colon: Secondary | ICD-10-CM | POA: Diagnosis not present

## 2017-01-19 DIAGNOSIS — K635 Polyp of colon: Secondary | ICD-10-CM | POA: Diagnosis not present

## 2017-01-19 DIAGNOSIS — Z79899 Other long term (current) drug therapy: Secondary | ICD-10-CM | POA: Diagnosis not present

## 2017-01-19 DIAGNOSIS — Z87891 Personal history of nicotine dependence: Secondary | ICD-10-CM | POA: Diagnosis not present

## 2017-01-19 DIAGNOSIS — D62 Acute posthemorrhagic anemia: Secondary | ICD-10-CM | POA: Diagnosis not present

## 2017-01-19 DIAGNOSIS — Z7902 Long term (current) use of antithrombotics/antiplatelets: Secondary | ICD-10-CM | POA: Diagnosis not present

## 2017-01-19 DIAGNOSIS — I482 Chronic atrial fibrillation: Secondary | ICD-10-CM | POA: Diagnosis not present

## 2017-01-19 DIAGNOSIS — J45909 Unspecified asthma, uncomplicated: Secondary | ICD-10-CM | POA: Diagnosis not present

## 2017-01-19 DIAGNOSIS — M069 Rheumatoid arthritis, unspecified: Secondary | ICD-10-CM | POA: Diagnosis not present

## 2017-01-19 DIAGNOSIS — E039 Hypothyroidism, unspecified: Secondary | ICD-10-CM | POA: Diagnosis not present

## 2017-01-20 DIAGNOSIS — D126 Benign neoplasm of colon, unspecified: Secondary | ICD-10-CM | POA: Diagnosis not present

## 2017-01-20 DIAGNOSIS — D649 Anemia, unspecified: Secondary | ICD-10-CM | POA: Diagnosis not present

## 2017-01-20 DIAGNOSIS — K921 Melena: Secondary | ICD-10-CM | POA: Diagnosis not present

## 2017-01-21 DIAGNOSIS — D126 Benign neoplasm of colon, unspecified: Secondary | ICD-10-CM | POA: Diagnosis not present

## 2017-01-21 DIAGNOSIS — K921 Melena: Secondary | ICD-10-CM | POA: Diagnosis not present

## 2017-01-21 DIAGNOSIS — D649 Anemia, unspecified: Secondary | ICD-10-CM | POA: Diagnosis not present

## 2017-01-22 DIAGNOSIS — I10 Essential (primary) hypertension: Secondary | ICD-10-CM | POA: Diagnosis not present

## 2017-01-22 DIAGNOSIS — Z7901 Long term (current) use of anticoagulants: Secondary | ICD-10-CM | POA: Diagnosis not present

## 2017-01-22 DIAGNOSIS — E785 Hyperlipidemia, unspecified: Secondary | ICD-10-CM | POA: Diagnosis not present

## 2017-01-22 DIAGNOSIS — K5731 Diverticulosis of large intestine without perforation or abscess with bleeding: Secondary | ICD-10-CM | POA: Diagnosis not present

## 2017-01-22 DIAGNOSIS — I480 Paroxysmal atrial fibrillation: Secondary | ICD-10-CM | POA: Diagnosis not present

## 2017-01-22 DIAGNOSIS — K922 Gastrointestinal hemorrhage, unspecified: Secondary | ICD-10-CM | POA: Diagnosis not present

## 2017-01-22 DIAGNOSIS — Z79899 Other long term (current) drug therapy: Secondary | ICD-10-CM | POA: Diagnosis not present

## 2017-02-07 DIAGNOSIS — I82409 Acute embolism and thrombosis of unspecified deep veins of unspecified lower extremity: Secondary | ICD-10-CM | POA: Diagnosis not present

## 2017-02-07 DIAGNOSIS — D649 Anemia, unspecified: Secondary | ICD-10-CM | POA: Diagnosis not present

## 2017-02-07 DIAGNOSIS — E785 Hyperlipidemia, unspecified: Secondary | ICD-10-CM | POA: Diagnosis not present

## 2017-02-07 DIAGNOSIS — E0590 Thyrotoxicosis, unspecified without thyrotoxic crisis or storm: Secondary | ICD-10-CM | POA: Diagnosis not present

## 2017-02-07 DIAGNOSIS — Z1331 Encounter for screening for depression: Secondary | ICD-10-CM | POA: Diagnosis not present

## 2017-02-07 DIAGNOSIS — Z9181 History of falling: Secondary | ICD-10-CM | POA: Diagnosis not present

## 2017-03-19 DIAGNOSIS — M069 Rheumatoid arthritis, unspecified: Secondary | ICD-10-CM | POA: Diagnosis not present

## 2017-03-19 DIAGNOSIS — I10 Essential (primary) hypertension: Secondary | ICD-10-CM | POA: Diagnosis not present

## 2017-03-19 DIAGNOSIS — E0590 Thyrotoxicosis, unspecified without thyrotoxic crisis or storm: Secondary | ICD-10-CM | POA: Diagnosis not present

## 2017-03-19 DIAGNOSIS — I50810 Right heart failure, unspecified: Secondary | ICD-10-CM | POA: Diagnosis not present

## 2017-04-19 ENCOUNTER — Encounter (HOSPITAL_COMMUNITY): Payer: Self-pay | Admitting: Internal Medicine

## 2017-04-19 ENCOUNTER — Inpatient Hospital Stay (HOSPITAL_COMMUNITY): Payer: Medicare Other

## 2017-04-19 ENCOUNTER — Inpatient Hospital Stay (HOSPITAL_COMMUNITY)
Admission: EM | Admit: 2017-04-19 | Discharge: 2017-04-26 | DRG: 853 | Disposition: A | Discharge status: Home Health | Payer: Medicare Other | Attending: Internal Medicine | Admitting: Internal Medicine

## 2017-04-19 ENCOUNTER — Emergency Department (HOSPITAL_COMMUNITY): Payer: Medicare Other

## 2017-04-19 DIAGNOSIS — A419 Sepsis, unspecified organism: Secondary | ICD-10-CM | POA: Diagnosis present

## 2017-04-19 DIAGNOSIS — R7881 Bacteremia: Secondary | ICD-10-CM | POA: Diagnosis present

## 2017-04-19 DIAGNOSIS — I5082 Biventricular heart failure: Secondary | ICD-10-CM | POA: Diagnosis not present

## 2017-04-19 DIAGNOSIS — Z880 Allergy status to penicillin: Secondary | ICD-10-CM

## 2017-04-19 DIAGNOSIS — D509 Iron deficiency anemia, unspecified: Secondary | ICD-10-CM | POA: Diagnosis present

## 2017-04-19 DIAGNOSIS — Z96653 Presence of artificial knee joint, bilateral: Secondary | ICD-10-CM | POA: Diagnosis not present

## 2017-04-19 DIAGNOSIS — R413 Other amnesia: Secondary | ICD-10-CM | POA: Diagnosis not present

## 2017-04-19 DIAGNOSIS — J069 Acute upper respiratory infection, unspecified: Secondary | ICD-10-CM

## 2017-04-19 DIAGNOSIS — I330 Acute and subacute infective endocarditis: Secondary | ICD-10-CM | POA: Diagnosis not present

## 2017-04-19 DIAGNOSIS — L03314 Cellulitis of groin: Secondary | ICD-10-CM | POA: Diagnosis not present

## 2017-04-19 DIAGNOSIS — Z91041 Radiographic dye allergy status: Secondary | ICD-10-CM

## 2017-04-19 DIAGNOSIS — E785 Hyperlipidemia, unspecified: Secondary | ICD-10-CM | POA: Diagnosis present

## 2017-04-19 DIAGNOSIS — I10 Essential (primary) hypertension: Secondary | ICD-10-CM | POA: Diagnosis not present

## 2017-04-19 DIAGNOSIS — B9562 Methicillin resistant Staphylococcus aureus infection as the cause of diseases classified elsewhere: Secondary | ICD-10-CM | POA: Diagnosis not present

## 2017-04-19 DIAGNOSIS — I5032 Chronic diastolic (congestive) heart failure: Secondary | ICD-10-CM | POA: Diagnosis not present

## 2017-04-19 DIAGNOSIS — L02224 Furuncle of groin: Secondary | ICD-10-CM | POA: Diagnosis not present

## 2017-04-19 DIAGNOSIS — M545 Low back pain: Secondary | ICD-10-CM | POA: Diagnosis not present

## 2017-04-19 DIAGNOSIS — R509 Fever, unspecified: Secondary | ICD-10-CM

## 2017-04-19 DIAGNOSIS — N764 Abscess of vulva: Secondary | ICD-10-CM | POA: Diagnosis present

## 2017-04-19 DIAGNOSIS — I110 Hypertensive heart disease with heart failure: Secondary | ICD-10-CM | POA: Diagnosis present

## 2017-04-19 DIAGNOSIS — I480 Paroxysmal atrial fibrillation: Secondary | ICD-10-CM | POA: Diagnosis present

## 2017-04-19 DIAGNOSIS — J9621 Acute and chronic respiratory failure with hypoxia: Secondary | ICD-10-CM | POA: Diagnosis present

## 2017-04-19 DIAGNOSIS — J9601 Acute respiratory failure with hypoxia: Secondary | ICD-10-CM | POA: Diagnosis not present

## 2017-04-19 DIAGNOSIS — R0902 Hypoxemia: Secondary | ICD-10-CM | POA: Diagnosis not present

## 2017-04-19 DIAGNOSIS — I358 Other nonrheumatic aortic valve disorders: Secondary | ICD-10-CM

## 2017-04-19 DIAGNOSIS — J029 Acute pharyngitis, unspecified: Secondary | ICD-10-CM | POA: Diagnosis not present

## 2017-04-19 DIAGNOSIS — R1310 Dysphagia, unspecified: Secondary | ICD-10-CM | POA: Diagnosis not present

## 2017-04-19 DIAGNOSIS — R6520 Severe sepsis without septic shock: Secondary | ICD-10-CM | POA: Diagnosis present

## 2017-04-19 DIAGNOSIS — R0602 Shortness of breath: Secondary | ICD-10-CM | POA: Diagnosis not present

## 2017-04-19 DIAGNOSIS — G9341 Metabolic encephalopathy: Secondary | ICD-10-CM | POA: Diagnosis present

## 2017-04-19 DIAGNOSIS — I700 Atherosclerosis of aorta: Secondary | ICD-10-CM | POA: Diagnosis not present

## 2017-04-19 DIAGNOSIS — F329 Major depressive disorder, single episode, unspecified: Secondary | ICD-10-CM | POA: Diagnosis present

## 2017-04-19 DIAGNOSIS — M48061 Spinal stenosis, lumbar region without neurogenic claudication: Secondary | ICD-10-CM | POA: Diagnosis not present

## 2017-04-19 DIAGNOSIS — A4102 Sepsis due to Methicillin resistant Staphylococcus aureus: Secondary | ICD-10-CM | POA: Diagnosis not present

## 2017-04-19 DIAGNOSIS — I351 Nonrheumatic aortic (valve) insufficiency: Secondary | ICD-10-CM | POA: Diagnosis not present

## 2017-04-19 DIAGNOSIS — Z87891 Personal history of nicotine dependence: Secondary | ICD-10-CM

## 2017-04-19 DIAGNOSIS — K08109 Complete loss of teeth, unspecified cause, unspecified class: Secondary | ICD-10-CM | POA: Diagnosis not present

## 2017-04-19 DIAGNOSIS — E876 Hypokalemia: Secondary | ICD-10-CM | POA: Diagnosis not present

## 2017-04-19 DIAGNOSIS — M069 Rheumatoid arthritis, unspecified: Secondary | ICD-10-CM | POA: Diagnosis present

## 2017-04-19 DIAGNOSIS — F32A Depression, unspecified: Secondary | ICD-10-CM | POA: Diagnosis present

## 2017-04-19 DIAGNOSIS — K219 Gastro-esophageal reflux disease without esophagitis: Secondary | ICD-10-CM | POA: Diagnosis not present

## 2017-04-19 DIAGNOSIS — M546 Pain in thoracic spine: Secondary | ICD-10-CM | POA: Diagnosis not present

## 2017-04-19 DIAGNOSIS — I50810 Right heart failure, unspecified: Secondary | ICD-10-CM | POA: Diagnosis present

## 2017-04-19 DIAGNOSIS — I38 Endocarditis, valve unspecified: Secondary | ICD-10-CM | POA: Diagnosis not present

## 2017-04-19 DIAGNOSIS — M5127 Other intervertebral disc displacement, lumbosacral region: Secondary | ICD-10-CM | POA: Diagnosis not present

## 2017-04-19 HISTORY — DX: Gastro-esophageal reflux disease without esophagitis: K21.9

## 2017-04-19 HISTORY — DX: Depression, unspecified: F32.A

## 2017-04-19 HISTORY — DX: Major depressive disorder, single episode, unspecified: F32.9

## 2017-04-19 HISTORY — DX: Right heart failure, unspecified: I50.810

## 2017-04-19 HISTORY — DX: Paroxysmal atrial fibrillation: I48.0

## 2017-04-19 HISTORY — DX: Essential (primary) hypertension: I10

## 2017-04-19 LAB — URINALYSIS, ROUTINE W REFLEX MICROSCOPIC
BILIRUBIN URINE: NEGATIVE
Bacteria, UA: NONE SEEN
Glucose, UA: NEGATIVE mg/dL
Ketones, ur: NEGATIVE mg/dL
Leukocytes, UA: NEGATIVE
NITRITE: NEGATIVE
PH: 6 (ref 5.0–8.0)
Protein, ur: NEGATIVE mg/dL
SPECIFIC GRAVITY, URINE: 1.008 (ref 1.005–1.030)
Squamous Epithelial / LPF: NONE SEEN

## 2017-04-19 LAB — I-STAT VENOUS BLOOD GAS, ED
Acid-Base Excess: 1 mmol/L (ref 0.0–2.0)
Bicarbonate: 24.8 mmol/L (ref 20.0–28.0)
O2 Saturation: 95 %
PCO2 VEN: 37.5 mmHg — AB (ref 44.0–60.0)
PH VEN: 7.429 (ref 7.250–7.430)
TCO2: 26 mmol/L (ref 22–32)
pO2, Ven: 72 mmHg — ABNORMAL HIGH (ref 32.0–45.0)

## 2017-04-19 LAB — CBC WITH DIFFERENTIAL/PLATELET
BASOS ABS: 0 10*3/uL (ref 0.0–0.1)
BASOS PCT: 0 %
EOS ABS: 0.1 10*3/uL (ref 0.0–0.7)
Eosinophils Relative: 1 %
HCT: 32.6 % — ABNORMAL LOW (ref 36.0–46.0)
HEMOGLOBIN: 10.1 g/dL — AB (ref 12.0–15.0)
Lymphocytes Relative: 9 %
Lymphs Abs: 1.4 10*3/uL (ref 0.7–4.0)
MCH: 25.4 pg — ABNORMAL LOW (ref 26.0–34.0)
MCHC: 31 g/dL (ref 30.0–36.0)
MCV: 82.1 fL (ref 78.0–100.0)
MONOS PCT: 7 %
Monocytes Absolute: 1 10*3/uL (ref 0.1–1.0)
NEUTROS PCT: 83 %
Neutro Abs: 12.4 10*3/uL — ABNORMAL HIGH (ref 1.7–7.7)
Platelets: 417 10*3/uL — ABNORMAL HIGH (ref 150–400)
RBC: 3.97 MIL/uL (ref 3.87–5.11)
RDW: 17.1 % — ABNORMAL HIGH (ref 11.5–15.5)
WBC: 14.9 10*3/uL — AB (ref 4.0–10.5)

## 2017-04-19 LAB — INFLUENZA PANEL BY PCR (TYPE A & B)
Influenza A By PCR: NEGATIVE
Influenza B By PCR: NEGATIVE

## 2017-04-19 LAB — I-STAT CG4 LACTIC ACID, ED
Lactic Acid, Venous: 1.79 mmol/L (ref 0.5–1.9)
Lactic Acid, Venous: 0.99 mmol/L (ref 0.5–1.9)

## 2017-04-19 LAB — COMPREHENSIVE METABOLIC PANEL
ALK PHOS: 106 U/L (ref 38–126)
ALT: 10 U/L — AB (ref 14–54)
AST: 23 U/L (ref 15–41)
Albumin: 3.1 g/dL — ABNORMAL LOW (ref 3.5–5.0)
Anion gap: 14 (ref 5–15)
BILIRUBIN TOTAL: 1.1 mg/dL (ref 0.3–1.2)
BUN: 10 mg/dL (ref 6–20)
CO2: 24 mmol/L (ref 22–32)
CREATININE: 0.84 mg/dL (ref 0.44–1.00)
Calcium: 8.5 mg/dL — ABNORMAL LOW (ref 8.9–10.3)
Chloride: 97 mmol/L — ABNORMAL LOW (ref 101–111)
Glucose, Bld: 92 mg/dL (ref 65–99)
Potassium: 3.7 mmol/L (ref 3.5–5.1)
Sodium: 135 mmol/L (ref 135–145)
TOTAL PROTEIN: 7.7 g/dL (ref 6.5–8.1)

## 2017-04-19 LAB — AMMONIA: AMMONIA: 24 umol/L (ref 9–35)

## 2017-04-19 LAB — BRAIN NATRIURETIC PEPTIDE: B NATRIURETIC PEPTIDE 5: 322.8 pg/mL — AB (ref 0.0–100.0)

## 2017-04-19 LAB — TROPONIN I: Troponin I: <0.03 ng/mL (ref <0.03)

## 2017-04-19 MED ORDER — IPRATROPIUM-ALBUTEROL 0.5-2.5 (3) MG/3ML IN SOLN
3.0000 mL | Freq: Once | RESPIRATORY_TRACT | Status: AC
  Administered 2017-04-19: 3 mL via RESPIRATORY_TRACT
  Filled 2017-04-19: qty 3

## 2017-04-19 MED ORDER — OXYCODONE-ACETAMINOPHEN 10-325 MG PO TABS: 1.0000 | ORAL_TABLET | Freq: Four times a day (QID) | ORAL | Status: DC | PRN

## 2017-04-19 MED ORDER — PANTOPRAZOLE SODIUM 40 MG PO TBEC
40.0000 mg | DELAYED_RELEASE_TABLET | Freq: Every day | ORAL | Status: DC
  Administered 2017-04-20 – 2017-04-26 (×7): 40 mg via ORAL
  Filled 2017-04-19 (×7): qty 1

## 2017-04-19 MED ORDER — HYDRALAZINE HCL 20 MG/ML IJ SOLN
5.0000 mg | INTRAMUSCULAR | Status: DC | PRN
  Administered 2017-04-24: 5 mg via INTRAVENOUS
  Filled 2017-04-19: qty 1

## 2017-04-19 MED ORDER — ONDANSETRON HCL 4 MG/2ML IJ SOLN: 4.0000 mg | Freq: Three times a day (TID) | INTRAMUSCULAR | Status: DC | PRN

## 2017-04-19 MED ORDER — PRAVASTATIN SODIUM 20 MG PO TABS
20.0000 mg | ORAL_TABLET | Freq: Every day | ORAL | Status: DC
  Administered 2017-04-20 – 2017-04-26 (×7): 20 mg via ORAL
  Filled 2017-04-19 (×7): qty 1

## 2017-04-19 MED ORDER — VANCOMYCIN HCL 10 G IV SOLR
1750.0000 mg | Freq: Once | INTRAVENOUS | Status: AC
  Administered 2017-04-19: 1750 mg via INTRAVENOUS
  Filled 2017-04-19: qty 1750

## 2017-04-19 MED ORDER — IPRATROPIUM-ALBUTEROL 0.5-2.5 (3) MG/3ML IN SOLN: 3.0000 mL | RESPIRATORY_TRACT | Status: DC

## 2017-04-19 MED ORDER — IPRATROPIUM-ALBUTEROL 0.5-2.5 (3) MG/3ML IN SOLN
3.0000 mL | Freq: Once | RESPIRATORY_TRACT | Status: AC
  Administered 2017-04-19: 3 mL via RESPIRATORY_TRACT

## 2017-04-19 MED ORDER — VANCOMYCIN HCL IN DEXTROSE 750-5 MG/150ML-% IV SOLN
750.0000 mg | Freq: Two times a day (BID) | INTRAVENOUS | Status: DC
  Filled 2017-04-19: qty 150

## 2017-04-19 MED ORDER — MOMETASONE FURO-FORMOTEROL FUM 200-5 MCG/ACT IN AERO: 2.0000 | INHALATION_SPRAY | Freq: Two times a day (BID) | RESPIRATORY_TRACT | Status: DC

## 2017-04-19 MED ORDER — OXYCODONE HCL 5 MG PO TABS
5.0000 mg | ORAL_TABLET | Freq: Four times a day (QID) | ORAL | Status: DC | PRN
  Administered 2017-04-20 – 2017-04-26 (×15): 5 mg via ORAL
  Filled 2017-04-19 (×15): qty 1

## 2017-04-19 MED ORDER — LEVOFLOXACIN IN D5W 500 MG/100ML IV SOLN
500.0000 mg | INTRAVENOUS | Status: DC
  Administered 2017-04-20: 500 mg via INTRAVENOUS
  Filled 2017-04-19: qty 100

## 2017-04-19 MED ORDER — PIPERACILLIN-TAZOBACTAM 3.375 G IVPB 30 MIN
3.3750 g | Freq: Once | INTRAVENOUS | Status: AC
  Administered 2017-04-19: 3.375 g via INTRAVENOUS
  Filled 2017-04-19: qty 50

## 2017-04-19 MED ORDER — FLECAINIDE ACETATE 50 MG PO TABS
50.0000 mg | ORAL_TABLET | Freq: Two times a day (BID) | ORAL | Status: DC
  Administered 2017-04-20 – 2017-04-26 (×14): 50 mg via ORAL
  Filled 2017-04-19 (×14): qty 1

## 2017-04-19 MED ORDER — ENOXAPARIN SODIUM 40 MG/0.4ML ~~LOC~~ SOLN
40.0000 mg | SUBCUTANEOUS | Status: DC
  Administered 2017-04-20 – 2017-04-26 (×6): 40 mg via SUBCUTANEOUS
  Filled 2017-04-19 (×7): qty 0.4

## 2017-04-19 MED ORDER — SODIUM CHLORIDE 0.9 % IV BOLUS
1000.0000 mL | Freq: Once | INTRAVENOUS | Status: AC
  Administered 2017-04-19: 1000 mL via INTRAVENOUS

## 2017-04-19 MED ORDER — PIPERACILLIN-TAZOBACTAM 3.375 G IVPB
3.3750 g | Freq: Three times a day (TID) | INTRAVENOUS | Status: DC
  Filled 2017-04-19: qty 50

## 2017-04-19 MED ORDER — ALBUTEROL SULFATE (2.5 MG/3ML) 0.083% IN NEBU
2.5000 mg | INHALATION_SOLUTION | RESPIRATORY_TRACT | Status: DC | PRN
  Administered 2017-04-22 – 2017-04-23 (×3): 2.5 mg via RESPIRATORY_TRACT
  Filled 2017-04-19 (×3): qty 3

## 2017-04-19 MED ORDER — OXYCODONE-ACETAMINOPHEN 5-325 MG PO TABS
1.0000 | ORAL_TABLET | Freq: Four times a day (QID) | ORAL | Status: DC | PRN
  Administered 2017-04-20 – 2017-04-25 (×12): 1 via ORAL
  Filled 2017-04-19 (×12): qty 1

## 2017-04-19 MED ORDER — VANCOMYCIN HCL IN DEXTROSE 1-5 GM/200ML-% IV SOLN: 1000.0000 mg | Freq: Once | INTRAVENOUS | Status: DC

## 2017-04-19 MED ORDER — DM-GUAIFENESIN ER 30-600 MG PO TB12
1.0000 | ORAL_TABLET | Freq: Two times a day (BID) | ORAL | Status: DC | PRN
  Administered 2017-04-23: 1 via ORAL
  Filled 2017-04-19: qty 1

## 2017-04-19 MED ORDER — ZOLPIDEM TARTRATE 5 MG PO TABS
5.0000 mg | ORAL_TABLET | Freq: Every evening | ORAL | Status: DC | PRN
  Administered 2017-04-24 (×2): 5 mg via ORAL
  Filled 2017-04-19 (×2): qty 1

## 2017-04-19 MED ORDER — ACETAMINOPHEN 500 MG PO TABS
500.0000 mg | ORAL_TABLET | Freq: Four times a day (QID) | ORAL | Status: DC | PRN
  Administered 2017-04-20 – 2017-04-23 (×4): 500 mg via ORAL
  Filled 2017-04-19 (×4): qty 1

## 2017-04-19 MED ORDER — ACETAMINOPHEN 500 MG PO TABS
1000.0000 mg | ORAL_TABLET | Freq: Once | ORAL | Status: AC
  Administered 2017-04-19: 1000 mg via ORAL
  Filled 2017-04-19: qty 2

## 2017-04-19 MED ORDER — DULOXETINE HCL 30 MG PO CPEP
30.0000 mg | ORAL_CAPSULE | Freq: Every day | ORAL | Status: DC
  Administered 2017-04-20 – 2017-04-25 (×6): 30 mg via ORAL
  Filled 2017-04-19 (×7): qty 1

## 2017-04-19 NOTE — Progress Notes (Addendum)
Pharmacy Antibiotic Note  Gabrielle Miller is a 77 y.o. female admitted on 04/19/2017 with sepsis.  Pharmacy has been consulted for vancomycin and Zosyn dosing. Patient presented with fever, tachycardia, tachypnea, and AMS. LA 1.79. CXR showed no PNA. Noted allergy to penicillins (hives), however already received one dose of Zosyn per MD provider. Spoke with MD and patient about penicillin allergy. Patient did not have an anaphylactic reaction to Zosyn, so will continue.  Patient received 3.375 gm Zosyn and 1,750 mg IV vancomycin loading dose in ED.   Plan: Vancomycin 750 mg IV every 12 hours.  Goal trough 15-20 mcg/mL. Zosyn 3.375g IV q8h (4 hour infusion).  Height: 5\' 5"  (165.1 cm) Weight: 160 lb (72.6 kg) IBW/kg (Calculated) : 57  Temp (24hrs), Avg:103.7 F (39.8 C), Min:103.7 F (39.8 C), Max:103.7 F (39.8 C)  Recent Labs  Lab 04/19/17 1708  LATICACIDVEN 1.79    CrCl cannot be calculated (No order found.).    Allergies not on file  Antimicrobials this admission: Vancomycin 4/19 >> Zosyn 4/19 >>  Dose adjustments this admission: none  Microbiology results: 4/19 BCx: pending 4/19 UCx: pending  Thank you for allowing pharmacy to be a part of this patient's care.  Jerene Pitch A Baggett 04/19/2017 5:17 PM   Addum:  Antibiotics changed to levaquin 500 mg IV q24 hours Excell Seltzer, PharmD

## 2017-04-19 NOTE — ED Provider Notes (Signed)
Emergency Department Provider Note   I have reviewed the triage vital signs and the nursing notes.   HISTORY  Chief Complaint URI   HPI Gabrielle Miller is a 77 y.o. female with PMH of PAF and HTN presents to the ED for evaluation of confusion with fever and difficulty breathing with coughing.  The patient lives at home.  EMS was called by family this morning.  EMS reports that she had low oxygen saturation on scene and was given albuterol DuoNeb along with nasal cannula oxygen.  She was given Solu-Medrol in route as well.  The patient is confused, reportedly more than baseline.  She denies any pain but is unable to tell me the year or where she is currently so I have some doubts about her ability to provide an adequate history.   Level 5 caveat: Confusion.   Past Medical History:  Diagnosis Date  . 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 Depression   . Essential hypertension   . GERD (gastroesophageal reflux disease)   . PAF (paroxysmal atrial fibrillation) [facility])     Patient Active Problem List   Diagnosis Date Noted  . Acute on chronic respiratory failure with hypoxia ([place]) 04/19/2017  . Sepsis ([place]) 04/19/2017  . Acute metabolic encephalopathy 17/40/8144  . Acute respiratory failure with hypoxia ([place]) 04/19/2017  . Essential hypertension   . GERD (gastroesophageal reflux disease)   . Depression   . PAF (paroxysmal atrial fibrillation) ([place])   . CHF with right heart failure ([facility])     Allergies Penicillins and Ivp dye [iodinated diagnostic agents]  No family history on file.  Social History Social History   Tobacco Use  . Smoking status: Former Research scientist (life sciences)  . Smokeless tobacco: Never Used  Substance Use Topics  . Alcohol use: Not Currently  . Drug use: Not Currently    Review of Systems  Level 5 caveat: Confusion.   ____________________________________________   PHYSICAL EXAM:  VITAL SIGNS: Vitals:   04/20/17 0717 04/20/17 0839  BP: 120/69   Pulse: 60     Resp: 19   Temp: 98.4 F (36.9 C)   SpO2: 100% 100%    Constitutional: Drowsy but awakens to voice. Confused.  Eyes: Conjunctivae are normal.  Head: Atraumatic. Nose: No congestion/rhinnorhea. Mouth/Throat: Mucous membranes are dry.  Neck: No stridor.  Cardiovascular: Tachycardia. Good peripheral circulation. Grossly normal heart sounds.   Respiratory: Normal respiratory effort.  No retractions. Lungs diminished at the bases with faint end-expiratory wheezing bilaterally.  Gastrointestinal: Soft and nontender. No distention.  Musculoskeletal: No lower extremity tenderness nor edema. No gross deformities of extremities. Neurologic: Somewhat drowsy and confused as above. No gross focal neurologic deficits are appreciated.  Skin:  Skin is warm, dry and intact. No rash noted.  ____________________________________________   LABS (all labs ordered are listed, but only abnormal results are displayed)  Labs Reviewed  BLOOD CULTURE ID PANEL (REFLEXED) - Abnormal; Notable for the following components:      Result Value   Staphylococcus species DETECTED (*)    Staphylococcus aureus DETECTED (*)    Methicillin resistance DETECTED (*)    All other components within normal limits  COMPREHENSIVE METABOLIC PANEL - Abnormal; Notable for the following components:   Chloride 97 (*)    Calcium 8.5 (*)    Albumin 3.1 (*)    ALT 10 (*)    All other components within normal limits  CBC WITH DIFFERENTIAL/PLATELET - Abnormal; Notable for the following components:  WBC 14.9 (*)    Hemoglobin 10.1 (*)    HCT 32.6 (*)    MCH 25.4 (*)    RDW 17.1 (*)    Platelets 417 (*)    Neutro Abs 12.4 (*)    All other components within normal limits  URINALYSIS, ROUTINE W REFLEX MICROSCOPIC - Abnormal; Notable for the following components:   Color, Urine STRAW (*)    Hgb urine dipstick SMALL (*)    All other components within normal limits  BRAIN NATRIURETIC PEPTIDE - Abnormal; Notable for the following  components:   B Natriuretic Peptide 322.8 (*)    All other components within normal limits  LACTIC ACID, PLASMA - Abnormal; Notable for the following components:   Lactic Acid, Venous 2.3 (*)    All other components within normal limits  GLUCOSE, CAPILLARY - Abnormal; Notable for the following components:   Glucose-Capillary 131 (*)    All other components within normal limits  I-STAT VENOUS BLOOD GAS, ED - Abnormal; Notable for the following components:   pCO2, Ven 37.5 (*)    pO2, Ven 72.0 (*)    All other components within normal limits  CULTURE, BLOOD (ROUTINE X 2)  CULTURE, BLOOD (ROUTINE X 2)  URINE CULTURE  CULTURE, EXPECTORATED SPUTUM-ASSESSMENT  GRAM STAIN  TROPONIN I  INFLUENZA PANEL BY PCR (TYPE A & B)  AMMONIA  LACTIC ACID, PLASMA  PROCALCITONIN  STREP PNEUMONIAE URINARY ANTIGEN  HIV ANTIBODY (ROUTINE TESTING)  I-STAT CG4 LACTIC ACID, ED  I-STAT CG4 LACTIC ACID, ED   ____________________________________________  EKG   EKG Interpretation  Date/Time:  Friday April 19 2017 16:44:34 EDT Ventricular Rate:  81 PR Interval:    QRS Duration: 141 QT Interval:  388 QTC Calculation: 451 R Axis:   48 Text Interpretation:  Sinus rhythm Atrial premature complex Right bundle branch block No STEMI.  Confirmed by Nanda Quinton [phone]) on 04/19/2017 4:47:18 PM       ____________________________________________  RADIOLOGY  Ct Head Wo Contrast  Result Date: 04/19/2017 CLINICAL DATA:  Initial evaluation for acute altered mental status. EXAM: CT HEAD WITHOUT CONTRAST TECHNIQUE: Contiguous axial images were obtained from the base of the skull through the vertex without intravenous contrast. COMPARISON:  None. FINDINGS: Brain: Generalized age-related cerebral atrophy with mild chronic small vessel ischemic disease. No acute intracranial hemorrhage. No acute large vessel territory infarct. No mass lesion, midline shift or mass effect. No hydrocephalus. No extra-axial fluid  collection. Vascular: No hyperdense vessel. Scattered vascular calcifications noted within the carotid siphons. Skull: No scalp soft tissue abnormality. Calvarium intact. Bones are diffusely demineralized. Sinuses/Orbits: Globes orbital soft tissues within normal limits. Scattered mucosal thickening within the ethmoidal air cells. Paranasal sinuses are otherwise clear. No mastoid effusion. Other: None. IMPRESSION: 1. No acute intracranial abnormality. 2. Age-related cerebral atrophy with mild chronic small vessel ischemic disease. 3. Mild ethmoidal sinusitis. Electronically Signed   By: Jeannine Boga M.D.   On: 04/19/2017 23:31   Dg Chest Port 1 View  Result Date: 04/19/2017 CLINICAL DATA:  Shortness of breath and fever. EXAM: PORTABLE CHEST 1 VIEW COMPARISON:  Single-view of the chest 06/30/2016 and 11/12/2015. CT chest 08/11/2010. FINDINGS: There is cardiomegaly without edema. Aortic atherosclerosis is noted. Mild right basilar atelectasis is seen. Hiatal hernia is noted. IMPRESSION: Cardiomegaly without acute disease. Hiatal hernia. Atherosclerosis. Electronically Signed   By: Inge Rise M.D.   On: 04/19/2017 17:19    ____________________________________________   PROCEDURES  Procedure(s) performed:   .Critical Care Performed by:  Long, Wonda Olds, MD Authorized by: Margette Fast, MD   Critical care provider statement:    Critical care time (minutes):  35   Critical care time was exclusive of:  Separately billable procedures and treating other patients and teaching time   Critical care was necessary to treat or prevent imminent or life-threatening deterioration of the following conditions:  Sepsis and respiratory failure   Critical care was time spent personally by me on the following activities:  Blood draw for specimens, development of treatment plan with patient or surrogate, evaluation of patient's response to treatment, examination of patient, obtaining history from patient  or surrogate, ordering and performing treatments and interventions, ordering and review of laboratory studies, ordering and review of radiographic studies, pulse oximetry, re-evaluation of patient's condition and review of old charts   I assumed direction of critical care for this patient from another provider in my specialty: no       ____________________________________________   INITIAL IMPRESSION / [place] / ED COURSE  Pertinent labs & imaging results that were available during my care of the patient were reviewed by me and considered in my medical decision making (see chart for details).  Patient presents to the emergency department for evaluation of fever, tachycardia, tachypnea, hypoxemia with associated confusion.  It was known at this time regarding the nature of symptom onset.  We have no prior visits to this health system.  I am able to see visits with cardiology in the past showing history of paroxysmal A. fib and hypertension.  No family available at this time.  I have initiated a code sepsis given the patient's symptoms including confusion and abnormal vital signs.  I suspect a respiratory cause after my exam.  I have initiated broad-spectrum antibiotics along with fluids.  She does have tachycardia but no hypotension.  Influenza PCR sent as well.   05:40 PM Spoke with [name] who is listed as the patient's emergency contact.  This is her sister.  She states that the patient lives at home with her son who she believes is in route to the emergency department.  She is unaware of this acute presentation and cannot provide any information regarding when it started or her recent medical history. CXR reviewed with no PNA.   08:30 PM Patient's son is at bedside. Patient symptoms have been primarily respiratory with fever. Abx started previously. No evidence on labs to suggest hypercarbic respiratory failure. Ammonia normal. Plan for admit to continue abx and respiratory  treatments.   Discussed patient's case with Hosppitalist to request admission. Patient and family (if present) updated with plan. Care transferred to Hospitalist service.  I reviewed all nursing notes, vitals, pertinent old records, EKGs, labs, imaging (as available).  ____________________________________________  FINAL CLINICAL IMPRESSION(S) / ED DIAGNOSES  Final diagnoses:  Viral upper respiratory tract infection  Hypoxemia  Fever, unspecified fever cause  Sepsis, due to unspecified organism ([place])     MEDICATIONS GIVEN DURING THIS VISIT:  Medications  acetaminophen (TYLENOL) tablet 500 mg (has no administration in time range)  DULoxetine (CYMBALTA) DR capsule 30 mg (has no administration in time range)  flecainide (TAMBOCOR) tablet 50 mg (50 mg Oral Given 04/20/17 0133)  pantoprazole (PROTONIX) EC tablet 40 mg (has no administration in time range)  pravastatin (PRAVACHOL) tablet 20 mg (has no administration in time range)  albuterol (PROVENTIL) (2.5 MG/3ML) 0.083% nebulizer solution 2.5 mg (has no administration in time range)  dextromethorphan-guaiFENesin (MUCINEX DM) 30-600 MG per 12 hr  tablet 1 tablet (has no administration in time range)  oxyCODONE-acetaminophen (PERCOCET/ROXICET) 5-325 MG per tablet 1 tablet (1 tablet Oral Given 04/20/17 0332)    And  oxyCODONE (Oxy IR/ROXICODONE) immediate release tablet 5 mg (5 mg Oral Given 04/20/17 0332)  enoxaparin (LOVENOX) injection 40 mg (has no administration in time range)  ondansetron (ZOFRAN) injection 4 mg (has no administration in time range)  hydrALAZINE (APRESOLINE) injection 5 mg (has no administration in time range)  zolpidem (AMBIEN) tablet 5 mg (has no administration in time range)  ipratropium-albuterol (DUONEB) 0.5-2.5 (3) MG/3ML nebulizer solution 3 mL (3 mLs Nebulization Given 04/20/17 0839)  MEDLINE mouth rinse (has no administration in time range)  0.9 %  sodium chloride infusion ( Intravenous New Bag/Given 04/20/17  0616)  metoprolol succinate (TOPROL-XL) 24 hr tablet 50 mg (has no administration in time range)  vancomycin (VANCOCIN) IVPB 750 mg/150 ml premix (has no administration in time range)  piperacillin-tazobactam (ZOSYN) IVPB 3.375 g (0 g Intravenous Stopped 04/19/17 1745)  sodium chloride 0.9 % bolus 1,000 mL (0 mLs Intravenous Stopped 04/19/17 1846)  acetaminophen (TYLENOL) tablet 1,000 mg (1,000 mg Oral Given 04/19/17 1719)  vancomycin (VANCOCIN) 1,750 mg in sodium chloride 0.9 % 500 mL IVPB (0 mg Intravenous Stopped 04/19/17 1928)  ipratropium-albuterol (DUONEB) 0.5-2.5 (3) MG/3ML nebulizer solution 3 mL (3 mLs Nebulization Given 04/19/17 2018)  ipratropium-albuterol (DUONEB) 0.5-2.5 (3) MG/3ML nebulizer solution 3 mL (3 mLs Nebulization Given 04/19/17 2030)  sodium chloride 0.9 % bolus 1,000 mL (0 mLs Intravenous Stopped 04/20/17 0615)    Note:  This document was prepared using Dragon voice recognition software and may include unintentional dictation errors.  Nanda Quinton, MD Emergency Medicine    Long, Wonda Olds, MD 04/20/17 [phone]

## 2017-04-19 NOTE — H&P (Signed)
History and Physical    Gabrielle Miller EPP:[id number] DOB: 11-02-1940 DOA: 04/19/2017  Referring MD/NP/PA:   PCP: Ocie Doyne., MD   Patient coming from:  The patient is coming from home.  At baseline, pt is independent for most of ADL.  Chief Complaint: shortness of breath,fever, chills, altered mental status  HPI: Gabrielle Miller is a 77 y.o. female with medical history significant of hypertension, hyperlipidemia, GERD, depression, PAF (off Eliquis), GI bleeding, CHF-right heart failure, rheumatoid arthritis, who presents with shortness breath, fever, chills, altered mental status. Visit (from Per patient's son, patient has some memory loss at baseline. In the past several days, patient has been more confused than her baseline. She has shortness of breath and mild dry cough. She also has fever and chills. No chest pain, runny nose or sore throat. Patient does not have nausea, vomiting, diarrhea, abdominal pain, symptoms of UTI. No unilateral weakness.  ED Course: pt was found to have WBC 14.9, negative troponin, BNP 322.8, lactic acid is 0.9, negative urinalysis, ammonia 24, negative flu PCR, electrolytes renal function okay, temperature 103.7, tachycardia, tachypnea, oxygen saturation 93% on room air, chest x-ray showed cardiomegaly without infiltration. CT head is negative for acute intracranial abnormalities. Patient is admitted to telemetry bed as inpatient.  Review of Systems: could not be reviewed accurately due to altered mental status.  Allergy:  Allergies  Allergen Reactions  . Penicillins Hives    Has patient had a PCN reaction causing immediate rash, facial/tongue/throat swelling, SOB or lightheadedness with hypotension: Yes Has patient had a PCN reaction causing severe rash involving mucus membranes or skin necrosis: No Has patient had a PCN reaction that required hospitalization: No Has patient had a PCN reaction occurring within the last 10 years: Yes If all of the above  answers are "NO", then may proceed with Cephalosporin use.    Samuel Germany Dye [Iodinated Diagnostic Agents] Nausea And Vomiting    Past Medical History:  Diagnosis Date  . CHF with right heart failure ([place])   . Depression   . Essential hypertension   . GERD (gastroesophageal reflux disease)   . PAF (paroxysmal atrial fibrillation) ([place])     Past Surgical History:  Procedure Laterality Date  . HERNIA REPAIR      Social History:  reports that she has quit smoking. She has never used smokeless tobacco. She reports that she drank alcohol. She reports that she has current or past drug history.  Family History: could not be reviewed due to altered mental status.  Prior to Admission medications   Medication Sig Start Date End Date Taking? Authorizing Provider  acetaminophen (TYLENOL) 500 MG tablet Take 1,000 mg by mouth every 6 (six) hours as needed (for arthritis pain).   Yes [provider]  DULoxetine (CYMBALTA) 30 MG capsule Take 30 mg by mouth daily. 02/20/17  Yes [provider]  flecainide (TAMBOCOR) 50 MG tablet Take 50 mg by mouth 2 (two) times daily.   Yes [provider]  fluticasone-salmeterol (ADVAIR HFA) 115-21 MCG/ACT inhaler Inhale 2 puffs into the lungs 2 (two) times daily as needed (for flares).  11/24/15  Yes [provider]  furosemide (LASIX) 40 MG tablet Take 40 mg by mouth daily.   Yes [provider]  metoprolol succinate (TOPROL-XL) 50 MG 24 hr tablet Take 50 mg by mouth daily. Take with or immediately following a meal.   Yes [provider]  omeprazole (PRILOSEC) 20 MG capsule Take 20 mg by mouth  daily before breakfast.   Yes [provider]  oxyCODONE-acetaminophen (PERCOCET) 10-325 MG tablet Take 1 tablet by mouth every 6 (six) hours as needed for pain.   Yes [provider]  potassium chloride (K-DUR,KLOR-CON) 10 MEQ tablet Take 10 mEq by mouth daily.   Yes [provider]  pravastatin  (PRAVACHOL) 20 MG tablet Take 20 mg by mouth daily.   Yes [provider]  UNABLE TO FIND Unnamed pain cream for rheumatoid arthritis: Apply to painful sites two to three times a day as needed for pain   Yes [provider]    Physical Exam: Vitals:   04/19/17 1830 04/19/17 1845 04/19/17 1847 04/19/17 2353  BP: (!) 146/87 (!) 142/84  (!) 143/62  Pulse: (!) 120 76  68  Resp: (!) 25 12  14   Temp:   99.2 F (37.3 C) 98.2 F (36.8 C)  TempSrc:   Oral Oral  SpO2: 99% 99%  100%  Weight:      Height:       General: Not in acute distress HEENT:       Eyes: PERRL, EOMI, no scleral icterus.       ENT: No discharge from the ears and nose, no pharynx injection, no tonsillar enlargement.        Neck: Positive JVD, no bruit, no mass felt. Heme: No neck lymph node enlargement. Cardiac: S1/S2, RRR, No murmurs, No gallops or rubs. Respiratory:  No rales, wheezing, rhonchi or rubs. GI: Soft, nondistended, nontender, no rebound pain, no organomegaly, BS present. GU: No hematuria Ext: No pitting leg edema bilaterally. 2+DP/PT pulse bilaterally. Musculoskeletal: No joint deformities, No joint redness or warmth, no limitation of ROM in spin. Skin: No rashes.  Neuro: confused, oriented to person and place, but not to time X3, cranial nerves II-XII grossly intact, moves all extremities normally.  Psych: Patient is not psychotic.  Labs on Admission: I have personally reviewed following labs and imaging studies  CBC: Recent Labs  Lab 04/19/17 1655  WBC 14.9*  NEUTROABS 12.4*  HGB 10.1*  HCT 32.6*  MCV 82.1  PLT 573*   Basic Metabolic Panel: Recent Labs  Lab 04/19/17 1655  NA 135  K 3.7  CL 97*  CO2 24  GLUCOSE 92  BUN 10  CREATININE 0.84  CALCIUM 8.5*   GFR: Estimated Creatinine Clearance: 56 mL/min (by C-G formula based on SCr of 0.84 mg/dL). Liver Function Tests: Recent Labs  Lab 04/19/17 1655  AST 23  ALT 10*  ALKPHOS 106  BILITOT 1.1  PROT 7.7    ALBUMIN 3.1*   No results for input(s): LIPASE, AMYLASE in the last 168 hours. Recent Labs  Lab 04/19/17 2020  AMMONIA 24   Coagulation Profile: No results for input(s): INR, PROTIME in the last 168 hours. Cardiac Enzymes: Recent Labs  Lab 04/19/17 1655  TROPONINI <0.03   BNP (last 3 results) No results for input(s): PROBNP in the last 8760 hours. HbA1C: No results for input(s): HGBA1C in the last 72 hours. CBG: No results for input(s): GLUCAP in the last 168 hours. Lipid Profile: No results for input(s): CHOL, HDL, LDLCALC, TRIG, CHOLHDL, LDLDIRECT in the last 72 hours. Thyroid Function Tests: No results for input(s): TSH, T4TOTAL, FREET4, T3FREE, THYROIDAB in the last 72 hours. Anemia Panel: No results for input(s): VITAMINB12, FOLATE, FERRITIN, TIBC, IRON, RETICCTPCT in the last 72 hours. Urine analysis:    Component Value Date/Time   COLORURINE STRAW (A) 04/19/2017 [place]  04/19/2017 1729   LABSPEC 1.008 04/19/2017 1729   PHURINE 6.0 04/19/2017 1729   GLUCOSEU NEGATIVE 04/19/2017 1729   HGBUR SMALL (A) 04/19/2017 1729   BILIRUBINUR NEGATIVE 04/19/2017 1729   KETONESUR NEGATIVE 04/19/2017 1729   PROTEINUR NEGATIVE 04/19/2017 1729   NITRITE NEGATIVE 04/19/2017 1729   LEUKOCYTESUR NEGATIVE 04/19/2017 1729   Sepsis Labs: @LABRCNTIP (procalcitonin:4,lacticidven:4) ) Recent Results (from the past 240 hour(s))  Blood Culture (routine x 2)     Status: None (Preliminary result)   Collection Time: 04/19/17  4:49 PM  Result Value Ref Range Status   Specimen Description BLOOD RIGHT ANTECUBITAL  Final   Special Requests   Final    BOTTLES DRAWN AEROBIC AND ANAEROBIC Blood Culture adequate volume   Culture  Setup Time   Final    GRAM POSITIVE COCCI IN BOTH AEROBIC AND ANAEROBIC BOTTLES Organism ID to follow Performed at [facility], [place] [address] [place], [place] [address]    Culture PENDING  Incomplete   Report Status PENDING  Incomplete   Blood Culture (routine x 2)     Status: None (Preliminary result)   Collection Time: 04/19/17  5:08 PM  Result Value Ref Range Status   Specimen Description BLOOD LEFT ANTECUBITAL  Final   Special Requests   Final    BOTTLES DRAWN AEROBIC AND ANAEROBIC Blood Culture adequate volume   Culture  Setup Time   Final    GRAM POSITIVE COCCI ANAEROBIC BOTTLE ONLY Performed at [facility], [address] [place], [place] [address]    Culture PENDING  Incomplete   Report Status PENDING  Incomplete     Radiological Exams on Admission: Ct Head Wo Contrast  Result Date: 04/19/2017 CLINICAL DATA:  Initial evaluation for acute altered mental status. EXAM: CT HEAD WITHOUT CONTRAST TECHNIQUE: Contiguous axial images were obtained from the base of the skull through the vertex without intravenous contrast. COMPARISON:  None. FINDINGS: Brain: Generalized age-related cerebral atrophy with mild chronic small vessel ischemic disease. No acute intracranial hemorrhage. No acute large vessel territory infarct. No mass lesion, midline shift or mass effect. No hydrocephalus. No extra-axial fluid collection. Vascular: No hyperdense vessel. Scattered vascular calcifications noted within the carotid siphons. Skull: No scalp soft tissue abnormality. Calvarium intact. Bones are diffusely demineralized. Sinuses/Orbits: Globes orbital soft tissues within normal limits. Scattered mucosal thickening within the ethmoidal air cells. Paranasal sinuses are otherwise clear. No mastoid effusion. Other: None. IMPRESSION: 1. No acute intracranial abnormality. 2. Age-related cerebral atrophy with mild chronic small vessel ischemic disease. 3. Mild ethmoidal sinusitis. Electronically Signed   By: Jeannine Boga M.D.   On: 04/19/2017 23:31   Dg Chest Port 1 View  Result Date: 04/19/2017 CLINICAL DATA:  Shortness of breath and fever. EXAM: PORTABLE CHEST 1 VIEW COMPARISON:  Single-view of the chest 06/30/2016 and 11/12/2015.  CT chest 08/11/2010. FINDINGS: There is cardiomegaly without edema. Aortic atherosclerosis is noted. Mild right basilar atelectasis is seen. Hiatal hernia is noted. IMPRESSION: Cardiomegaly without acute disease. Hiatal hernia. Atherosclerosis. Electronically Signed   By: Inge Rise M.D.   On: 04/19/2017 17:19     EKG: Independently reviewed.  Sinus rhythm, QTC 451, PVC, right bundle blockage, early R-wave progression.   Assessment/Plan Principal Problem:   Acute on chronic respiratory failure with hypoxia (HCC) Active Problems:   Essential hypertension   GERD (gastroesophageal reflux disease)   Depression   PAF (paroxysmal atrial fibrillation) (H[facility])   CHF with right heart failure ([place])   Sepsis (  [place])   Acute metabolic encephalopathy   Acute respiratory failure with hypoxia (HCC)   Acute on chronic respiratory failure with hypoxia sepsis: etiology is not clear. Possibly due to bronchitis given fever, leukocytosis. Chest x-ray has no infiltration, but cannot completely rule out early stage for pneumonia. Patient meet criteria for sepsis with leukocytosis, tachycardia, tachypnea and fever. Lactic acid is normal. Hemodynamically stable. Flu pcr negative. Lactic acid initially normal-->then trending up to 2.3  - will admit to tele bed as inpt - IV Levaquin (pt Received one dose of vancomycin and Zosyn in ED). - Mucinex for cough  - prn Albuterol Nebs, DuoNeb for SOB - Urine legionella and S. pneumococcal antigen - Follow up blood culture x2, sputum culture - will get Procalcitonin and trend lactic acid level per sepsis protocol - IVF: 2. L of NS bolus, and 75 cc/h  HTN:  -Continue home medications: Metoprolol -IV hydralazine prn  GERD: -Protonix  Atrial Fibrillation: CHA2DS2-VASc Score is 5, needs oral anticoagulation. Patient was on Eliquis before, but stopped due to GIB. Heart rate is well controlled. -continue metoprolol and flecainide  Depression Stable, no suicidal or  homicidal ideations. -Continue home medications: Cymbalta  HLD: -Pravastatin  CHF with right heart failure [facility]): Patient has does not have leg edema. Chest x-ray showed pulmonary edema. Status is to be compensated. -Hold Lasix -Check BNP  Acute metabolic encephalopathy: Likely due to sepsis and hypoxia. -Frequent neuro check -CT head    DVT ppx:  SQ Lovenox Code Status: Full code Family Communication:    Yes, patient's son   at bed side Disposition Plan:  Anticipate discharge back to previous home environment Consults called: none  Admission status:  Inpatient/tele      Date of Service 04/20/2017    Ivor Costa [facility] Pager [phone]  If 7PM-7AM, please contact night-coverage [email or web address] Password [facility] 04/20/2017, 6:38 AM

## 2017-04-19 NOTE — ED Notes (Signed)
Attempted to call report to floor nurse ([place], [place])

## 2017-04-19 NOTE — ED Triage Notes (Signed)
Pt arrives via [facility] from home; pt was disoriented from Aox4 baseline; absent bilateral lung sounds diminished upper lung sounds; 86% Spo2 RA; 2.5 abuterol and duoneb nebulizer treatments given; congested lung sounds audible in all fields and 93% on RA; placed on 2L nasal canula; 125 solumederol given; pt disoriented on arrival; GCS 13; EMS vitals: 172/94 manual BP; RR 32: CBG 119; 104.7 temporal temperature  22 Lforearm

## 2017-04-20 ENCOUNTER — Other Ambulatory Visit: Payer: Self-pay

## 2017-04-20 ENCOUNTER — Encounter (HOSPITAL_COMMUNITY): Payer: Self-pay | Admitting: Internal Medicine

## 2017-04-20 DIAGNOSIS — B9562 Methicillin resistant Staphylococcus aureus infection as the cause of diseases classified elsewhere: Secondary | ICD-10-CM

## 2017-04-20 DIAGNOSIS — I10 Essential (primary) hypertension: Secondary | ICD-10-CM

## 2017-04-20 DIAGNOSIS — Z91041 Radiographic dye allergy status: Secondary | ICD-10-CM

## 2017-04-20 DIAGNOSIS — Z96653 Presence of artificial knee joint, bilateral: Secondary | ICD-10-CM

## 2017-04-20 DIAGNOSIS — I480 Paroxysmal atrial fibrillation: Secondary | ICD-10-CM

## 2017-04-20 DIAGNOSIS — Z8261 Family history of arthritis: Secondary | ICD-10-CM

## 2017-04-20 DIAGNOSIS — M069 Rheumatoid arthritis, unspecified: Secondary | ICD-10-CM

## 2017-04-20 DIAGNOSIS — Z87891 Personal history of nicotine dependence: Secondary | ICD-10-CM

## 2017-04-20 DIAGNOSIS — R7881 Bacteremia: Secondary | ICD-10-CM

## 2017-04-20 DIAGNOSIS — R413 Other amnesia: Secondary | ICD-10-CM

## 2017-04-20 DIAGNOSIS — J9621 Acute and chronic respiratory failure with hypoxia: Secondary | ICD-10-CM

## 2017-04-20 DIAGNOSIS — Z8249 Family history of ischemic heart disease and other diseases of the circulatory system: Secondary | ICD-10-CM

## 2017-04-20 DIAGNOSIS — K219 Gastro-esophageal reflux disease without esophagitis: Secondary | ICD-10-CM

## 2017-04-20 DIAGNOSIS — Z880 Allergy status to penicillin: Secondary | ICD-10-CM

## 2017-04-20 LAB — BLOOD CULTURE ID PANEL (REFLEXED)
ACINETOBACTER BAUMANNII: NOT DETECTED
CANDIDA ALBICANS: NOT DETECTED
CANDIDA GLABRATA: NOT DETECTED
CANDIDA TROPICALIS: NOT DETECTED
Candida krusei: NOT DETECTED
Candida parapsilosis: NOT DETECTED
ENTEROBACTER CLOACAE COMPLEX: NOT DETECTED
ENTEROBACTERIACEAE SPECIES: NOT DETECTED
Enterococcus species: NOT DETECTED
Escherichia coli: NOT DETECTED
HAEMOPHILUS INFLUENZAE: NOT DETECTED
KLEBSIELLA PNEUMONIAE: NOT DETECTED
Klebsiella oxytoca: NOT DETECTED
LISTERIA MONOCYTOGENES: NOT DETECTED
METHICILLIN RESISTANCE: DETECTED — AB
NEISSERIA MENINGITIDIS: NOT DETECTED
PSEUDOMONAS AERUGINOSA: NOT DETECTED
Proteus species: NOT DETECTED
STREPTOCOCCUS AGALACTIAE: NOT DETECTED
STREPTOCOCCUS PNEUMONIAE: NOT DETECTED
Serratia marcescens: NOT DETECTED
Staphylococcus aureus (BCID): DETECTED — AB
Staphylococcus species: DETECTED — AB
Streptococcus pyogenes: NOT DETECTED
Streptococcus species: NOT DETECTED

## 2017-04-20 LAB — URINE CULTURE
CULTURE: NO GROWTH
Special Requests: NORMAL

## 2017-04-20 LAB — HIV ANTIBODY (ROUTINE TESTING W REFLEX): HIV Screen 4th Generation wRfx: NONREACTIVE

## 2017-04-20 LAB — GLUCOSE, CAPILLARY
GLUCOSE-CAPILLARY: 131 mg/dL — AB (ref 65–99)
Glucose-Capillary: 101 mg/dL — ABNORMAL HIGH (ref 65–99)
Glucose-Capillary: 98 mg/dL (ref 65–99)

## 2017-04-20 LAB — LACTIC ACID, PLASMA
LACTIC ACID, VENOUS: 1.7 mmol/L (ref 0.5–1.9)
Lactic Acid, Venous: 2.3 mmol/L (ref 0.5–1.9)

## 2017-04-20 LAB — STREP PNEUMONIAE URINARY ANTIGEN: STREP PNEUMO URINARY ANTIGEN: NEGATIVE

## 2017-04-20 LAB — PROCALCITONIN: PROCALCITONIN: 0.73 ng/mL

## 2017-04-20 MED ORDER — METOPROLOL SUCCINATE ER 50 MG PO TB24
50.0000 mg | ORAL_TABLET | Freq: Every day | ORAL | Status: DC
  Administered 2017-04-20: 50 mg via ORAL
  Filled 2017-04-20: qty 1

## 2017-04-20 MED ORDER — IPRATROPIUM-ALBUTEROL 0.5-2.5 (3) MG/3ML IN SOLN
3.0000 mL | Freq: Four times a day (QID) | RESPIRATORY_TRACT | Status: DC
  Administered 2017-04-20 – 2017-04-21 (×5): 3 mL via RESPIRATORY_TRACT
  Filled 2017-04-20 (×5): qty 3

## 2017-04-20 MED ORDER — VANCOMYCIN HCL IN DEXTROSE 750-5 MG/150ML-% IV SOLN
750.0000 mg | Freq: Two times a day (BID) | INTRAVENOUS | Status: DC
  Administered 2017-04-20 – 2017-04-26 (×12): 750 mg via INTRAVENOUS
  Filled 2017-04-20 (×16): qty 150

## 2017-04-20 MED ORDER — ORAL CARE MOUTH RINSE
15.0000 mL | Freq: Two times a day (BID) | OROMUCOSAL | Status: DC
  Administered 2017-04-20 – 2017-04-26 (×10): 15 mL via OROMUCOSAL

## 2017-04-20 MED ORDER — SODIUM CHLORIDE 0.9 % IV SOLN
INTRAVENOUS | Status: DC
  Administered 2017-04-20 – 2017-04-21 (×3): via INTRAVENOUS

## 2017-04-20 MED ORDER — SODIUM CHLORIDE 0.9 % IV BOLUS
1000.0000 mL | Freq: Once | INTRAVENOUS | Status: AC
  Administered 2017-04-20: 1000 mL via INTRAVENOUS

## 2017-04-20 NOTE — Progress Notes (Signed)
CRITICAL VALUE ALERT  Critical Value: lactic acid 2.3  Date & Time Notied: 04/20/17 0434  Provider Notified: NP [place]  Orders Received/Actions taken: 1000 ml bolus ordered

## 2017-04-20 NOTE — Plan of Care (Signed)
Discussed with patient plan of care for the evening, pain management and floor routine with some teach back displayed

## 2017-04-20 NOTE — Progress Notes (Signed)
PHARMACY - PHYSICIAN COMMUNICATION CRITICAL VALUE ALERT - BLOOD CULTURE IDENTIFICATION (BCID)  Gabrielle Miller is an 77 y.o. female who presented to [facility] on 04/19/2017 with a chief complaint of respiratory distress.  Assessment:  2 sets of blood cx drawn, all 4 bottles growing MRSA  Name of physician (or Provider) Contacted: JMemon  Current antibiotics: Levaquin  Changes to prescribed antibiotics recommended:  Recommendations accepted by provider  Results for orders placed or performed during the hospital encounter of 04/19/17  Blood Culture ID Panel (Reflexed) (Collected: 04/19/2017  4:49 PM)  Result Value Ref Range   Enterococcus species NOT DETECTED NOT DETECTED   Listeria monocytogenes NOT DETECTED NOT DETECTED   Staphylococcus species DETECTED (A) NOT DETECTED   Staphylococcus aureus DETECTED (A) NOT DETECTED   Methicillin resistance DETECTED (A) NOT DETECTED   Streptococcus species NOT DETECTED NOT DETECTED   Streptococcus agalactiae NOT DETECTED NOT DETECTED   Streptococcus pneumoniae NOT DETECTED NOT DETECTED   Streptococcus pyogenes NOT DETECTED NOT DETECTED   Acinetobacter baumannii NOT DETECTED NOT DETECTED   Enterobacteriaceae species NOT DETECTED NOT DETECTED   Enterobacter cloacae complex NOT DETECTED NOT DETECTED   Escherichia coli NOT DETECTED NOT DETECTED   Klebsiella oxytoca NOT DETECTED NOT DETECTED   Klebsiella pneumoniae NOT DETECTED NOT DETECTED   Proteus species NOT DETECTED NOT DETECTED   Serratia marcescens NOT DETECTED NOT DETECTED   Haemophilus influenzae NOT DETECTED NOT DETECTED   Neisseria meningitidis NOT DETECTED NOT DETECTED   Pseudomonas aeruginosa NOT DETECTED NOT DETECTED   Candida albicans NOT DETECTED NOT DETECTED   Candida glabrata NOT DETECTED NOT DETECTED   Candida krusei NOT DETECTED NOT DETECTED   Candida parapsilosis NOT DETECTED NOT DETECTED   Candida tropicalis NOT DETECTED NOT DETECTED    Wynona Neat, PharmD, BCPS   04/20/2017  7:26 AM

## 2017-04-20 NOTE — Progress Notes (Signed)
PROGRESS NOTE    Gabrielle Miller  OAC:[id number] DOB: 19-Oct-1940 DOA: 04/19/2017 PCP: Ocie Doyne., MD    Brief Narrative:  77 year old female brought to the hospital with shortness of breath, fever, chills and altered mental status.  Initial concern for possible pulmonary infection, but chest x-ray does not show any clear pneumonia.  She is found to have 2 out of 2 positive sets of blood cultures for staph aureus.  Currently on intravenous vancomycin.  Further workup in progress.   Assessment & Plan:   Principal Problem:   Acute on chronic respiratory failure with hypoxia (HCC) Active Problems:   Essential hypertension   GERD (gastroesophageal reflux disease)   Depression   PAF (paroxysmal atrial fibrillation) (HCC)   CHF with right heart failure (HCC)   Sepsis (HCC)   Acute metabolic encephalopathy   Acute respiratory failure with hypoxia ([place])   1. Acute respiratory failure with hypoxia.  Possibly related to acute bronchitis.  She was having cough, shortness of breath, tachypnea.  Treat supportively.  Wean down oxygen as tolerated.   2. Staph aureus bacteremia.  Patient had 2 out of 2 sets of blood cultures positive for staph aureus.  Currently on vancomycin.  Appreciate infectious disease assistance.  2D echocardiogram has been ordered, but will likely need TEE.  No obvious source of infection at this time.  Continue current treatments. 3. Atrial fibrillation.  Chads vas score of 5, not on anticoagulation due to history of GI bleeding in the past.  Continue on metoprolol flecainide. 4. Hypertension.  Continue on metoprolol.  Blood pressure stable. 5. GERD.  Continue Protonix 6. Hyperlipidemia.  Continue statin.  History of right-sided heart failure.  Appears compensated at this time. 7. Continue to hold Lasix. 8. Acute metabolic encephalopathy.  Likely related to underlying infectious process.  Mental status appears to have returned to baseline.  CT head was unremarkable.   DVT  prophylaxis: Lovenox Code Status: Full code Family Communication: Discussed with multiple family members at the bedside Disposition Plan: Discharge home once bacteremia workup is complete.   Consultants:   Infectious disease  Procedures:     Antimicrobials:   Vancomycin 4/19 >   Subjective: Patient is feeling better today.  Still has a mild cough.  Denies any chest pain, vomiting or diarrhea.  Denies any skin sores.  Objective: Vitals:   04/20/17 0839 04/20/17 1203 04/20/17 1553 04/20/17 1655  BP:    (!) 126/98  Pulse:    (!) 110  Resp:    (!) 22  Temp:    98.1 F (36.7 C)  TempSrc:    Oral  SpO2: 100% 99% 97% 93%  Weight:      Height:        Intake/Output Summary (Last 24 hours) at 04/20/2017 1818 Last data filed at 04/20/2017 1722 Gross per 24 hour  Intake 2480 ml  Output 300 ml  Net 2180 ml   Filed Weights   04/19/17 1704  Weight: 72.6 kg (160 lb)    Examination:  General exam: Appears calm and comfortable  Respiratory system: Clear to auscultation. Respiratory effort normal. Cardiovascular system: S1 & S2 heard, RRR. No JVD, murmurs, rubs, gallops or clicks. No pedal edema. Gastrointestinal system: Abdomen is nondistended, soft and nontender. No organomegaly or masses felt. Normal bowel sounds heard. Central nervous system: Alert and oriented. No focal neurological deficits. Extremities: Symmetric 5 x 5 power. Skin: No rashes, lesions or ulcers Psychiatry: Judgement and insight appear normal. Mood & affect appropriate.  Data Reviewed: I have personally reviewed following labs and imaging studies  CBC: Recent Labs  Lab 04/19/17 1655  WBC 14.9*  NEUTROABS 12.4*  HGB 10.1*  HCT 32.6*  MCV 82.1  PLT 295*   Basic Metabolic Panel: Recent Labs  Lab 04/19/17 1655  NA 135  K 3.7  CL 97*  CO2 24  GLUCOSE 92  BUN 10  CREATININE 0.84  CALCIUM 8.5*   GFR: Estimated Creatinine Clearance: 56 mL/min (by C-G formula based on SCr of 0.84  mg/dL). Liver Function Tests: Recent Labs  Lab 04/19/17 1655  AST 23  ALT 10*  ALKPHOS 106  BILITOT 1.1  PROT 7.7  ALBUMIN 3.1*   No results for input(s): LIPASE, AMYLASE in the last 168 hours. Recent Labs  Lab 04/19/17 2020  AMMONIA 24   Coagulation Profile: No results for input(s): INR, PROTIME in the last 168 hours. Cardiac Enzymes: Recent Labs  Lab 04/19/17 1655  TROPONINI <0.03   BNP (last 3 results) No results for input(s): PROBNP in the last 8760 hours. HbA1C: No results for input(s): HGBA1C in the last 72 hours. CBG: Recent Labs  Lab 04/20/17 0743 04/20/17 1218 04/20/17 1657  GLUCAP 131* 101* 98   Lipid Profile: No results for input(s): CHOL, HDL, LDLCALC, TRIG, CHOLHDL, LDLDIRECT in the last 72 hours. Thyroid Function Tests: No results for input(s): TSH, T4TOTAL, FREET4, T3FREE, THYROIDAB in the last 72 hours. Anemia Panel: No results for input(s): VITAMINB12, FOLATE, FERRITIN, TIBC, IRON, RETICCTPCT in the last 72 hours. Sepsis Labs: Recent Labs  Lab 04/19/17 1708 04/19/17 1855 04/19/17 2320 04/20/17 0035 04/20/17 0316  PROCALCITON  --   --  0.73  --   --   LATICACIDVEN 1.79 0.99  --  1.7 2.3*    Recent Results (from the past 240 hour(s))  Blood Culture (routine x 2)     Status: None (Preliminary result)   Collection Time: 04/19/17  4:49 PM  Result Value Ref Range Status   Specimen Description BLOOD RIGHT ANTECUBITAL  Final   Special Requests   Final    BOTTLES DRAWN AEROBIC AND ANAEROBIC Blood Culture adequate volume   Culture  Setup Time   Final    GRAM POSITIVE COCCI IN BOTH AEROBIC AND ANAEROBIC BOTTLES CRITICAL RESULT CALLED TO, READ BACK BY AND VERIFIED WITH: VERANDA [place] PHARMD AT [place] [id number] BY SJW Performed at [facility], [place] [address] [place], [place] [address]    Culture GRAM POSITIVE COCCI  Final   Report Status PENDING  Incomplete  Blood Culture ID Panel (Reflexed)     Status: Abnormal   Collection Time: 04/19/17   4:49 PM  Result Value Ref Range Status   Enterococcus species NOT DETECTED NOT DETECTED Final   Listeria monocytogenes NOT DETECTED NOT DETECTED Final   Staphylococcus species DETECTED (A) NOT DETECTED Final    Comment: CRITICAL RESULT CALLED TO, READ BACK BY AND VERIFIED WITH: VERANDA BYRK PHARMD AT 0715 ON [id number] BY SJW    Staphylococcus aureus DETECTED (A) NOT DETECTED Final    Comment: Methicillin (oxacillin)-resistant Staphylococcus aureus (MRSA). MRSA is predictably resistant to beta-lactam antibiotics (except ceftaroline). Preferred therapy is vancomycin unless clinically contraindicated. Patient requires contact precautions if  hospitalized. CRITICAL RESULT CALLED TO, READ BACK BY AND VERIFIED WITH: VERANDA BYRK PHARMD AT 0715 ON [id number] BY SJW    Methicillin resistance DETECTED (A) NOT DETECTED Final    Comment: CRITICAL RESULT CALLED TO, READ BACK BY AND VERIFIED WITH: [place] PHARMD  AT 0715 ON [id number] BY SJW    Streptococcus species NOT DETECTED NOT DETECTED Final   Streptococcus agalactiae NOT DETECTED NOT DETECTED Final   Streptococcus pneumoniae NOT DETECTED NOT DETECTED Final   Streptococcus pyogenes NOT DETECTED NOT DETECTED Final   Acinetobacter baumannii NOT DETECTED NOT DETECTED Final   Enterobacteriaceae species NOT DETECTED NOT DETECTED Final   Enterobacter cloacae complex NOT DETECTED NOT DETECTED Final   Escherichia coli NOT DETECTED NOT DETECTED Final   Klebsiella oxytoca NOT DETECTED NOT DETECTED Final   Klebsiella pneumoniae NOT DETECTED NOT DETECTED Final   Proteus species NOT DETECTED NOT DETECTED Final   Serratia marcescens NOT DETECTED NOT DETECTED Final   Haemophilus influenzae NOT DETECTED NOT DETECTED Final   Neisseria meningitidis NOT DETECTED NOT DETECTED Final   Pseudomonas aeruginosa NOT DETECTED NOT DETECTED Final   Candida albicans NOT DETECTED NOT DETECTED Final   Candida glabrata NOT DETECTED NOT DETECTED Final   Candida krusei NOT  DETECTED NOT DETECTED Final   Candida parapsilosis NOT DETECTED NOT DETECTED Final   Candida tropicalis NOT DETECTED NOT DETECTED Final    Comment: Performed at [facility], [place] [address] [place], [place] [address]  Urine culture     Status: None   Collection Time: 04/19/17  5:01 PM  Result Value Ref Range Status   Specimen Description URINE, CATHETERIZED  Final   Special Requests Normal  Final   Culture   Final    NO GROWTH Performed at [facility], [address] [place], [place] [address]    Report Status 04/20/2017 FINAL  Final  Blood Culture (routine x 2)     Status: None (Preliminary result)   Collection Time: 04/19/17  5:08 PM  Result Value Ref Range Status   Specimen Description BLOOD LEFT ANTECUBITAL  Final   Special Requests   Final    BOTTLES DRAWN AEROBIC AND ANAEROBIC Blood Culture adequate volume   Culture  Setup Time   Final    GRAM POSITIVE COCCI IN BOTH AEROBIC AND ANAEROBIC BOTTLES CRITICAL VALUE NOTED.  VALUE IS CONSISTENT WITH PREVIOUSLY REPORTED AND CALLED VALUE. Performed at [facility], [place] [address] [place], [place] [address]    Culture [facility] POSITIVE COCCI  Final   Report Status PENDING  Incomplete         Radiology Studies: Ct Head Wo Contrast  Result Date: 04/19/2017 CLINICAL DATA:  Initial evaluation for acute altered mental status. EXAM: CT HEAD WITHOUT CONTRAST TECHNIQUE: Contiguous axial images were obtained from the base of the skull through the vertex without intravenous contrast. COMPARISON:  None. FINDINGS: Brain: Generalized age-related cerebral atrophy with mild chronic small vessel ischemic disease. No acute intracranial hemorrhage. No acute large vessel territory infarct. No mass lesion, midline shift or mass effect. No hydrocephalus. No extra-axial fluid collection. Vascular: No hyperdense vessel. Scattered vascular calcifications noted within the carotid siphons. Skull: No scalp soft tissue abnormality. Calvarium  intact. Bones are diffusely demineralized. Sinuses/Orbits: Globes orbital soft tissues within normal limits. Scattered mucosal thickening within the ethmoidal air cells. Paranasal sinuses are otherwise clear. No mastoid effusion. Other: None. IMPRESSION: 1. No acute intracranial abnormality. 2. Age-related cerebral atrophy with mild chronic small vessel ischemic disease. 3. Mild ethmoidal sinusitis. Electronically Signed   By: Jeannine Boga M.D.   On: 04/19/2017 23:31   Dg Chest Port 1 View  Result Date: 04/19/2017 CLINICAL DATA:  Shortness of breath and fever. EXAM: PORTABLE CHEST 1 VIEW COMPARISON:  Single-view  of the chest 06/30/2016 and 11/12/2015. CT chest 08/11/2010. FINDINGS: There is cardiomegaly without edema. Aortic atherosclerosis is noted. Mild right basilar atelectasis is seen. Hiatal hernia is noted. IMPRESSION: Cardiomegaly without acute disease. Hiatal hernia. Atherosclerosis. Electronically Signed   By: Inge Rise M.D.   On: 04/19/2017 17:19        Scheduled Meds: . DULoxetine  30 mg Oral Daily  . enoxaparin (LOVENOX) injection  40 mg Subcutaneous Q24H  . flecainide  50 mg Oral BID  . ipratropium-albuterol  3 mL Nebulization QID  . mouth rinse  15 mL Mouth Rinse BID  . metoprolol succinate  50 mg Oral Daily  . pantoprazole  40 mg Oral Daily  . pravastatin  20 mg Oral Daily   Continuous Infusions: . sodium chloride 75 mL/hr at 04/20/17 0616  . vancomycin Stopped (04/20/17 0954)     LOS: 1 day    Time spent: 36mins    Kathie Dike, MD [facility] Pager [phone]  If 7PM-7AM, please contact night-coverage [email or web address] Password [email or web address] 04/20/2017, 6:18 PM

## 2017-04-20 NOTE — Consult Note (Signed)
[facility] for Infectious Disease  Total days of antibiotics 2        Day 2 vanco               Reason for Consult: mrsa bacteremia   Referring Physician: memon  Principal Problem:   Acute on chronic respiratory failure with hypoxia (HCC) Active Problems:   Essential hypertension   GERD (gastroesophageal reflux disease)   Depression   PAF (paroxysmal atrial fibrillation) (HCC)   CHF with right heart failure (HCC)   Sepsis ([place])   Acute metabolic encephalopathy   Acute respiratory failure with hypoxia (HCC)    HPI: Gabrielle Miller is a 77 y.o. female with history of PAF, RA, hx of bilateral TKA, GERD, HTN, memory impairment, admitted for fevers, with intermittent chills, difficulty breathing/hpoxia.She reports that she felt acute onset of symptoms the night of admission. She reports poor sleep since admission. No worsening of her standard arthritic pain, no swelling of joints, no recent cuts/injury. Feeling fatigued. Work up on admit, had CXR that did not show any infiltrate per my read. Leukocytosis of 14.9K with left shift.  Past Medical History:  Diagnosis Date  . CHF with right heart failure ([place])   . Depression   . Essential hypertension   . GERD (gastroesophageal reflux disease)   . PAF (paroxysmal atrial fibrillation) (HCC)     Allergies:  Allergies  Allergen Reactions  . Penicillins Hives    Has patient had a PCN reaction causing immediate rash, facial/tongue/throat swelling, SOB or lightheadedness with hypotension: Yes Has patient had a PCN reaction causing severe rash involving mucus membranes or skin necrosis: No Has patient had a PCN reaction that required hospitalization: No Has patient had a PCN reaction occurring within the last 10 years: Yes If all of the above answers are "NO", then may proceed with Cephalosporin use.    Samuel [place] Dye [Iodinated Diagnostic Agents] Nausea And Vomiting     MEDICATIONS: . DULoxetine  30 mg Oral Daily  . enoxaparin  (LOVENOX) injection  40 mg Subcutaneous Q24H  . flecainide  50 mg Oral BID  . ipratropium-albuterol  3 mL Nebulization QID  . mouth rinse  15 mL Mouth Rinse BID  . metoprolol succinate  50 mg Oral Daily  . pantoprazole  40 mg Oral Daily  . pravastatin  20 mg Oral Daily    Social History   Tobacco Use  . Smoking status: Former Research scientist (life sciences)  . Smokeless tobacco: Never Used  Substance Use Topics  . Alcohol use: Not Currently  . Drug use: Not Currently    Family hx: RA, HTN, CAD  Review of Systems -   Constitutional: Negative for fever, chills, diaphoresis, activity change, appetite change, fatigue and unexpected weight change.  HENT: Negative for congestion, sore throat, rhinorrhea, sneezing, trouble swallowing and sinus pressure.  Eyes: Negative for photophobia and visual disturbance.  Respiratory: Negative for cough, chest tightness, shortness of breath, wheezing and stridor.  Cardiovascular: Negative for chest pain, palpitations and leg swelling.  Gastrointestinal: Negative for nausea, vomiting, abdominal pain, diarrhea, constipation, blood in stool, abdominal distention and anal bleeding.  Genitourinary: Negative for dysuria, hematuria, flank pain and difficulty urinating.  Musculoskeletal: + arthralgias and gait problem.  Skin: Negative for color change, pallor, rash and wound.  Neurological: Negative for dizziness, tremors, weakness and light-headedness.  Hematological: Negative for adenopathy. Does not bruise/bleed easily.  Psychiatric/Behavioral: Negative for behavioral problems, confusion, sleep disturbance, dysphoric mood, decreased concentration and agitation.  OBJECTIVE: Temp:  [98.2 F (36.8 C)-103.7 F (39.8 C)] 98.4 F (36.9 C) (04/20 0717) Pulse Rate:  [60-120] 60 (04/20 0717) Resp:  [12-29] 19 (04/20 0717) BP: (120-163)/(62-100) 120/69 (04/20 0717) SpO2:  [93 %-100 %] 99 % (04/20 1203) Weight:  [160 lb (72.6 kg)] 160 lb (72.6 kg) (04/19 1704) Physical Exam   Constitutional:  oriented to person, place, and time. appears well-developed and well-nourished. No distress.  HENT: [place]/AT, PERRLA, no scleral icterus Mouth/Throat: Oropharynx is clear and moist. No oropharyngeal exudate.  Cardiovascular: Normal rate, regular rhythm and normal heart sounds. Exam reveals no gallop and no friction rub.  No murmur heard.  Pulmonary/Chest: Effort normal and breath sounds normal. No respiratory distress.  has no wheezes.  Neck = supple, no nuchal rigidity Abdominal: Soft. Bowel sounds are normal.  exhibits no distension. There is no tenderness.  Lymphadenopathy: no cervical adenopathy. No axillary adenopathy Neurological: alert and oriented to person, place, and time Ext: mild deformation arthritic changes to fingers/hands L>R. Knees show surgical incision scar but no swelling, good range of motion.  Skin: Skin is warm and dry. No rash noted. No erythema.  Psychiatric: a normal mood and affect.  behavior is normal.   LABS: Results for orders placed or performed during the hospital encounter of 04/19/17 (from the past 48 hour(s))  Blood Culture (routine x 2)     Status: None (Preliminary result)   Collection Time: 04/19/17  4:49 PM  Result Value Ref Range   Specimen Description BLOOD RIGHT ANTECUBITAL    Special Requests      BOTTLES DRAWN AEROBIC AND ANAEROBIC Blood Culture adequate volume   Culture  Setup Time      GRAM POSITIVE COCCI IN BOTH AEROBIC AND ANAEROBIC BOTTLES CRITICAL RESULT CALLED TO, READ BACK BY AND VERIFIED WITH: VERANDA [place] PHARMD AT 2778 [id number] BY SJW Performed at [facility], [place] [address], [place], [place] [address]    Culture GRAM POSITIVE COCCI    Report Status PENDING   Blood Culture ID Panel (Reflexed)     Status: Abnormal   Collection Time: 04/19/17  4:49 PM  Result Value Ref Range   Enterococcus species NOT DETECTED NOT DETECTED   Listeria monocytogenes NOT DETECTED NOT DETECTED   Staphylococcus species DETECTED  (A) NOT DETECTED    Comment: CRITICAL RESULT CALLED TO, READ BACK BY AND VERIFIED WITH: VERANDA BYRK PHARMD AT 0715 ON [id number] BY SJW    Staphylococcus aureus DETECTED (A) NOT DETECTED    Comment: Methicillin (oxacillin)-resistant Staphylococcus aureus (MRSA). MRSA is predictably resistant to beta-lactam antibiotics (except ceftaroline). Preferred therapy is vancomycin unless clinically contraindicated. Patient requires contact precautions if  hospitalized. CRITICAL RESULT CALLED TO, READ BACK BY AND VERIFIED WITH: VERANDA BYRK PHARMD AT 0715 ON [id number] BY SJW    Methicillin resistance DETECTED (A) NOT DETECTED    Comment: CRITICAL RESULT CALLED TO, READ BACK BY AND VERIFIED WITH: VERANDA BYRK PHARMD  AT 0715 ON [id number] BY SJW    Streptococcus species NOT DETECTED NOT DETECTED   Streptococcus agalactiae NOT DETECTED NOT DETECTED   Streptococcus pneumoniae NOT DETECTED NOT DETECTED   Streptococcus pyogenes NOT DETECTED NOT DETECTED   Acinetobacter baumannii NOT DETECTED NOT DETECTED   Enterobacteriaceae species NOT DETECTED NOT DETECTED   Enterobacter cloacae complex NOT DETECTED NOT DETECTED   Escherichia coli NOT DETECTED NOT DETECTED   Klebsiella oxytoca NOT DETECTED NOT DETECTED   Klebsiella pneumoniae NOT DETECTED NOT DETECTED   Proteus species NOT DETECTED NOT  DETECTED   Serratia marcescens NOT DETECTED NOT DETECTED   Haemophilus influenzae NOT DETECTED NOT DETECTED   Neisseria meningitidis NOT DETECTED NOT DETECTED   Pseudomonas aeruginosa NOT DETECTED NOT DETECTED   Candida albicans NOT DETECTED NOT DETECTED   Candida glabrata NOT DETECTED NOT DETECTED   Candida krusei NOT DETECTED NOT DETECTED   Candida parapsilosis NOT DETECTED NOT DETECTED   Candida tropicalis NOT DETECTED NOT DETECTED    Comment: Performed at [facility], [place] [address], [place], [place] [address]  Comprehensive metabolic panel     Status: Abnormal   Collection Time: 04/19/17  4:55 PM  Result Value  Ref Range   Sodium 135 135 - 145 mmol/L   Potassium 3.7 3.5 - 5.1 mmol/L   Chloride 97 (L) 101 - 111 mmol/L   CO2 24 22 - 32 mmol/L   Glucose, Bld 92 65 - 99 mg/dL   BUN 10 6 - 20 mg/dL   Creatinine, Ser 0.84 0.44 - 1.00 mg/dL   Calcium 8.5 (L) 8.9 - 10.3 mg/dL   Total Protein 7.7 6.5 - 8.1 g/dL   Albumin 3.1 (L) 3.5 - 5.0 g/dL   AST 23 15 - 41 U/L   ALT 10 (L) 14 - 54 U/L   Alkaline Phosphatase 106 38 - 126 U/L   Total Bilirubin 1.1 0.3 - 1.2 mg/dL   GFR calc non Af Amer >60 >60 mL/min   GFR calc Af Amer >60 >60 mL/min    Comment: (NOTE) The eGFR has been calculated using the CKD EPI equation. This calculation has not been validated in all clinical situations. eGFR's persistently <60 mL/min signify possible Chronic Kidney Disease.    Anion gap 14 5 - 15    Comment: Performed at [facility] [address] [place], [place] [address]  CBC WITH DIFFERENTIAL     Status: Abnormal   Collection Time: 04/19/17  4:55 PM  Result Value Ref Range   WBC 14.9 (H) 4.0 - 10.5 K/uL   RBC 3.97 3.87 - 5.11 MIL/uL   Hemoglobin 10.1 (L) 12.0 - 15.0 g/dL   HCT 32.6 (L) 36.0 - 46.0 %   MCV 82.1 78.0 - 100.0 fL   MCH 25.4 (L) 26.0 - 34.0 pg   MCHC 31.0 30.0 - 36.0 g/dL   RDW 17.1 (H) 11.5 - 15.5 %   Platelets 417 (H) 150 - 400 K/uL   Neutrophils Relative % 83 %   Neutro Abs 12.4 (H) 1.7 - 7.7 K/uL   Lymphocytes Relative 9 %   Lymphs Abs 1.4 0.7 - 4.0 K/uL   Monocytes Relative 7 %   Monocytes Absolute 1.0 0.1 - 1.0 K/uL   Eosinophils Relative 1 %   Eosinophils Absolute 0.1 0.0 - 0.7 K/uL   Basophils Relative 0 %   Basophils Absolute 0.0 0.0 - 0.1 K/uL    Comment: Performed at [facility], [address], [place], [place] [address]  Brain natriuretic peptide     Status: Abnormal   Collection Time: 04/19/17  4:55 PM  Result Value Ref Range   B Natriuretic Peptide 322.8 (H) 0.0 - 100.0 pg/mL    Comment: Performed at [facility] [address] [place], [place]  [address]  Troponin I     Status: None   Collection Time: 04/19/17  4:55 PM  Result Value Ref Range   Troponin I <0.03 <0.03 ng/mL    Comment: Performed at [facility],  [address], [place], [place] [address]  Influenza panel by PCR (type A & B)     Status: None   Collection Time: 04/19/17  5:01 PM  Result Value Ref Range   Influenza A By PCR NEGATIVE NEGATIVE   Influenza B By PCR NEGATIVE NEGATIVE    Comment: (NOTE) The Xpert Xpress Flu assay is intended as an aid in the diagnosis of  influenza and should not be used as a sole basis for treatment.  This  assay is FDA approved for nasopharyngeal swab specimens only. Nasal  washings and aspirates are unacceptable for Xpert Xpress Flu testing. Performed at [facility], [place] [address] [place], [place] [address]   I-Stat CG4 Lactic Acid, ED  (not at  [facility])     Status: None   Collection Time: 04/19/17  5:08 PM  Result Value Ref Range   Lactic Acid, Venous 1.79 0.5 - 1.9 mmol/L  Blood Culture (routine x 2)     Status: None (Preliminary result)   Collection Time: 04/19/17  5:08 PM  Result Value Ref Range   Specimen Description BLOOD LEFT ANTECUBITAL    Special Requests      BOTTLES DRAWN AEROBIC AND ANAEROBIC Blood Culture adequate volume   Culture  Setup Time      GRAM POSITIVE COCCI IN BOTH AEROBIC AND ANAEROBIC BOTTLES CRITICAL VALUE NOTED.  VALUE IS CONSISTENT WITH PREVIOUSLY REPORTED AND CALLED VALUE. Performed at [facility], [place] [address] [place], [place] [address]    Culture GRAM POSITIVE COCCI    Report Status PENDING   Urinalysis, Routine w reflex microscopic     Status: Abnormal   Collection Time: 04/19/17  5:29 PM  Result Value Ref Range   Color, Urine STRAW (A) YELLOW   APPearance CLEAR CLEAR   Specific Gravity, Urine 1.008 1.005 - 1.030   pH 6.0 5.0 - 8.0   Glucose, UA NEGATIVE NEGATIVE mg/dL   Hgb urine dipstick SMALL (A) NEGATIVE   Bilirubin Urine NEGATIVE NEGATIVE   Ketones, ur NEGATIVE  NEGATIVE mg/dL   Protein, ur NEGATIVE NEGATIVE mg/dL   Nitrite NEGATIVE NEGATIVE   Leukocytes, UA NEGATIVE NEGATIVE   RBC / HPF 0-5 0 - 5 RBC/hpf   WBC, UA 0-5 0 - 5 WBC/hpf   Bacteria, UA NONE SEEN NONE SEEN   Squamous Epithelial / LPF NONE SEEN NONE SEEN    Comment: Performed at [facility], [place] [address] [place], [place] [address]  I-Stat CG4 Lactic Acid, ED  (not at  [facility])     Status: None   Collection Time: 04/19/17  6:55 PM  Result Value Ref Range   Lactic Acid, Venous 0.99 0.5 - 1.9 mmol/L  Ammonia     Status: None   Collection Time: 04/19/17  8:20 PM  Result Value Ref Range   Ammonia 24 9 - 35 umol/L    Comment: Performed at [facility], [place] [address] [place], [place] [address]  I-Stat Venous Blood Gas, ED (order at [facility] and [facility] only)     Status: Abnormal   Collection Time: 04/19/17  8:30 PM  Result Value Ref Range   pH, Ven 7.429 7.250 - 7.430   pCO2, Ven 37.5 (L) 44.0 - 60.0 mmHg   pO2, Ven 72.0 (H) 32.0 - 45.0 mmHg   Bicarbonate 24.8 20.0 - 28.0 mmol/L   TCO2 26 22 - 32 mmol/L   O2 Saturation 95.0 %   Acid-Base Excess 1.0  0.0 - 2.0 mmol/L   Patient temperature HIDE    Sample type VENOUS   Procalcitonin     Status: None   Collection Time: 04/19/17 11:20 PM  Result Value Ref Range   Procalcitonin 0.73 ng/mL    Comment:        Interpretation: PCT > 0.5 ng/mL and <= 2 ng/mL: Systemic infection (sepsis) is possible, but other conditions are known to elevate PCT as well. (NOTE)       Sepsis PCT Algorithm           Lower Respiratory Tract                                      Infection PCT Algorithm    ----------------------------     ----------------------------         PCT < 0.25 ng/mL                PCT < 0.10 ng/mL         Strongly encourage             Strongly discourage   discontinuation of antibiotics    initiation of antibiotics    ----------------------------     -----------------------------       PCT 0.25 - 0.50 ng/mL            PCT 0.10  - 0.25 ng/mL               OR       >80% decrease in PCT            Discourage initiation of                                            antibiotics      Encourage discontinuation           of antibiotics    ----------------------------     -----------------------------         PCT >= 0.50 ng/mL              PCT 0.26 - 0.50 ng/mL                AND       <80% decrease in PCT             Encourage initiation of                                             antibiotics       Encourage continuation           of antibiotics    ----------------------------     -----------------------------        PCT >= 0.50 ng/mL                  PCT > 0.50 ng/mL               AND         increase in PCT                  Strongly encourage  initiation of antibiotics    Strongly encourage escalation           of antibiotics                                     -----------------------------                                           PCT <= 0.25 ng/mL                                                 OR                                        > 80% decrease in PCT                                     Discontinue / Do not initiate                                             antibiotics Performed at [facility], [address], [place], [place] [address]   Lactic acid, plasma     Status: None   Collection Time: 04/20/17 12:35 AM  Result Value Ref Range   Lactic Acid, Venous 1.7 0.5 - 1.9 mmol/L    Comment: Performed at [facility] [address], [place], [place] [address]  Lactic acid, plasma     Status: Abnormal   Collection Time: 04/20/17  3:16 AM  Result Value Ref Range   Lactic Acid, Venous 2.3 (HH) 0.5 - 1.9 mmol/L    Comment: CRITICAL RESULT CALLED TO, READ BACK BY AND VERIFIED WITH: NEILSEN,T RN 04/20/2017 0435 JORDANS Performed at [facility], [place] [address] [place], [place] [address]   Glucose, capillary     Status: Abnormal   Collection  Time: 04/20/17  7:43 AM  Result Value Ref Range   Glucose-Capillary 131 (H) 65 - 99 mg/dL  Glucose, capillary     Status: Abnormal   Collection Time: 04/20/17 12:18 PM  Result Value Ref Range   Glucose-Capillary 101 (H) 65 - 99 mg/dL    MICRO: 4/19 blood cx 2/2 sets MRSA IMAGING: Ct Head Wo Contrast  Result Date: 04/19/2017 CLINICAL DATA:  Initial evaluation for acute altered mental status. EXAM: CT HEAD WITHOUT CONTRAST TECHNIQUE: Contiguous axial images were obtained from the base of the skull through the vertex without intravenous contrast. COMPARISON:  None. FINDINGS: Brain: Generalized age-related cerebral atrophy with mild chronic small vessel ischemic disease. No acute intracranial hemorrhage. No acute large vessel territory infarct. No mass lesion, midline shift or mass effect. No hydrocephalus. No extra-axial fluid collection. Vascular: No hyperdense vessel. Scattered vascular calcifications noted within the carotid siphons. Skull: No scalp soft tissue abnormality. Calvarium intact. Bones are diffusely demineralized. Sinuses/Orbits: Globes orbital soft tissues within normal limits. Scattered  mucosal thickening within the ethmoidal air cells. Paranasal sinuses are otherwise clear. No mastoid effusion. Other: None. IMPRESSION: 1. No acute intracranial abnormality. 2. Age-related cerebral atrophy with mild chronic small vessel ischemic disease. 3. Mild ethmoidal sinusitis. Electronically Signed   By: Jeannine Boga M.D.   On: 04/19/2017 23:31   Dg Chest Port 1 View  Result Date: 04/19/2017 CLINICAL DATA:  Shortness of breath and fever. EXAM: PORTABLE CHEST 1 VIEW COMPARISON:  Single-view of the chest 06/30/2016 and 11/12/2015. CT chest 08/11/2010. FINDINGS: There is cardiomegaly without edema. Aortic atherosclerosis is noted. Mild right basilar atelectasis is seen. Hiatal hernia is noted. IMPRESSION: Cardiomegaly without acute disease. Hiatal hernia. Atherosclerosis. Electronically  Signed   By: Inge Rise M.D.   On: 04/19/2017 17:19    Assessment/Plan:76yo F with RA, PAF admitted for confusion, fever, difficulty breathing, LA 2.3 concerning for sirs/sepsis found to have  MRSA bacteremia, unclear source.  Narrow abtx to vancomycin alone, goal of 15-20 for vanco trough Will need to follow cr and vanco trough to ensure minimize risk of vanco toxicity Would recommend TEE to rule out endocarditis, can start with TTE Repeat blood cx tomorrow to see that she is clearing her bacteremia        [facility] [facility] Staphylococcus aureus bacteremia   Staphylococcus aureus bacteremia (SAB) is associated with a high rate of complications and mortality.  Specific aspects of clinical management are critical to optimizing the outcome of patients with SAB.  Therefore, the [facility] Antimicrobial Management Team [facility]) has initiated an intervention aimed at improving the management of SAB at [facility].  To do so, Infectious Diseases physicians are providing an evidence-based consult for the management of all patients with SAB.     Yes No Comments  Perform follow-up blood cultures (even if the patient is afebrile) to ensure clearance of bacteremia '[]'$  '[]'$  Perform tomorrow       Perform echocardiography to evaluate for endocarditis (transthoracic ECHO is 40-50% sensitive, TEE is > 90% sensitive) '[]'$  '[]'$  Please keep in mind, that neither test can definitively EXCLUDE endocarditis, and that should clinical suspicion remain high for endocarditis the patient should then still be treated with an "endocarditis" duration of therapy = 6 weeks       Ensure source control '[]'$  '[]'$  Have all abscesses been drained effectively? Have deep seeded infections (septic joints or osteomyelitis) had appropriate surgical debridement?  Investigate for "metastatic" sites of infection '[]'$  '[]'$  Does the patient have ANY symptom or physical exam finding that would suggest a deeper infection  (back or neck pain that may be suggestive of vertebral osteomyelitis or epidural abscess, muscle pain that could be a symptom of pyomyositis)?  Keep in mind that for deep seeded infections MRI imaging with contrast is preferred rather than other often insensitive tests such as plain x-rays, especially early in a patient's presentation.  Change antibiotic therapy to __vancomycin___ '[x]'$  '[]'$  Beta-lactam antibiotics are preferred for MSSA due to higher cure rates.   If on Vancomycin, goal trough should be 15 - 20 mcg/mL  Estimated duration of IV antibiotic therapy:  4 wk for now '[x]'$  '[]'$  Consult case management for probably prolonged outpatient IV antibiotic therapy

## 2017-04-20 NOTE — Progress Notes (Signed)
This RN was notified by central telemetry that pt had 3 beat pause. This RN went to check on pt and pt was asymptomatic and pt heart rate on the monitor showed atrial fibrillation. Notified Dr. Roderic Palau of pause and heart rhythm, will continue to monitor pt.

## 2017-04-20 NOTE — Progress Notes (Signed)
Pharmacy Antibiotic Note  Gabrielle Miller is a 77 y.o. female admitted on 04/19/2017 with acute on chronic respiratory failure, started on Levaquin for suspected bronchitis, now w/ bacteremia w/ MRSA.  Pharmacy has been consulted for vancomycin dosing.  Plan: Rec'd vanc 1750mg  give in ED 4/19 at 1730. Vancomycin 750mg  IV every 12 hours.  Goal trough 15-20 mcg/mL.  Height: 5\' 5"  (165.1 cm) Weight: 160 lb (72.6 kg) IBW/kg (Calculated) : 57  Temp (24hrs), Avg:99.9 F (37.7 C), Min:98.2 F (36.8 C), Max:103.7 F (39.8 C)  Recent Labs  Lab 04/19/17 1655 04/19/17 1708 04/19/17 1855 04/20/17 0035 04/20/17 0316  WBC 14.9*  --   --   --   --   CREATININE 0.84  --   --   --   --   LATICACIDVEN  --  1.79 0.99 1.7 2.3*    Estimated Creatinine Clearance: 56 mL/min (by C-G formula based on SCr of 0.84 mg/dL).    Allergies  Allergen Reactions  . Penicillins Hives    Has patient had a PCN reaction causing immediate rash, facial/tongue/throat swelling, SOB or lightheadedness with hypotension: Yes Has patient had a PCN reaction causing severe rash involving mucus membranes or skin necrosis: No Has patient had a PCN reaction that required hospitalization: No Has patient had a PCN reaction occurring within the last 10 years: Yes If all of the above answers are "NO", then may proceed with Cephalosporin use.    Clementeen Hoof [Iodinated Diagnostic Agents] Nausea And Vomiting     Thank you for allowing pharmacy to be a part of this patient's care.  Wynona Neat, PharmD, BCPS  04/20/2017 7:27 AM

## 2017-04-21 ENCOUNTER — Inpatient Hospital Stay (HOSPITAL_COMMUNITY): Payer: Medicare Other

## 2017-04-21 ENCOUNTER — Other Ambulatory Visit (HOSPITAL_COMMUNITY): Payer: Medicare Other

## 2017-04-21 DIAGNOSIS — R7881 Bacteremia: Secondary | ICD-10-CM

## 2017-04-21 LAB — CBC
HCT: 26.4 % — ABNORMAL LOW (ref 36.0–46.0)
HEMOGLOBIN: 8.1 g/dL — AB (ref 12.0–15.0)
MCH: 24.9 pg — ABNORMAL LOW (ref 26.0–34.0)
MCHC: 30.7 g/dL (ref 30.0–36.0)
MCV: 81.2 fL (ref 78.0–100.0)
PLATELETS: 353 10*3/uL (ref 150–400)
RBC: 3.25 MIL/uL — AB (ref 3.87–5.11)
RDW: 17.6 % — ABNORMAL HIGH (ref 11.5–15.5)
WBC: 13.1 10*3/uL — AB (ref 4.0–10.5)

## 2017-04-21 LAB — BASIC METABOLIC PANEL
ANION GAP: 9 (ref 5–15)
BUN: 13 mg/dL (ref 6–20)
CO2: 24 mmol/L (ref 22–32)
Calcium: 8.2 mg/dL — ABNORMAL LOW (ref 8.9–10.3)
Chloride: 103 mmol/L (ref 101–111)
Creatinine, Ser: 0.83 mg/dL (ref 0.44–1.00)
Glucose, Bld: 103 mg/dL — ABNORMAL HIGH (ref 65–99)
POTASSIUM: 3.4 mmol/L — AB (ref 3.5–5.1)
SODIUM: 136 mmol/L (ref 135–145)

## 2017-04-21 LAB — GLUCOSE, CAPILLARY
GLUCOSE-CAPILLARY: 93 mg/dL (ref 65–99)
GLUCOSE-CAPILLARY: 123 mg/dL — AB (ref 65–99)
Glucose-Capillary: 114 mg/dL — ABNORMAL HIGH (ref 65–99)
Glucose-Capillary: 159 mg/dL — ABNORMAL HIGH (ref 65–99)

## 2017-04-21 LAB — MAGNESIUM: MAGNESIUM: 1.8 mg/dL (ref 1.7–2.4)

## 2017-04-21 LAB — ECHOCARDIOGRAM COMPLETE
Height: 65 in
WEIGHTICAEL: 2560 [oz_av]

## 2017-04-21 LAB — VANCOMYCIN, TROUGH: VANCOMYCIN TR: 17 ug/mL (ref 15–20)

## 2017-04-21 MED ORDER — POTASSIUM CHLORIDE CRYS ER 20 MEQ PO TBCR
40.0000 meq | EXTENDED_RELEASE_TABLET | Freq: Once | ORAL | Status: AC
  Administered 2017-04-21: 40 meq via ORAL
  Filled 2017-04-21: qty 2

## 2017-04-21 MED ORDER — IPRATROPIUM-ALBUTEROL 0.5-2.5 (3) MG/3ML IN SOLN
3.0000 mL | Freq: Three times a day (TID) | RESPIRATORY_TRACT | Status: DC
  Administered 2017-04-21 – 2017-04-22 (×3): 3 mL via RESPIRATORY_TRACT
  Filled 2017-04-21 (×3): qty 3

## 2017-04-21 MED ORDER — METOPROLOL SUCCINATE ER 25 MG PO TB24
25.0000 mg | ORAL_TABLET | Freq: Every day | ORAL | Status: DC
  Administered 2017-04-21 – 2017-04-25 (×5): 25 mg via ORAL
  Filled 2017-04-21 (×5): qty 1

## 2017-04-21 MED ORDER — DICLOFENAC SODIUM 1 % TD GEL
2.0000 g | Freq: Four times a day (QID) | TRANSDERMAL | Status: DC
  Administered 2017-04-21 – 2017-04-26 (×17): 2 g via TOPICAL
  Filled 2017-04-21: qty 100

## 2017-04-21 NOTE — Progress Notes (Signed)
Pharmacy Antibiotic Note  Gabrielle Miller is a 77 y.o. female admitted on 04/19/2017 with acute on chronic respiratory failure, started on Levaquin for suspected bronchitis, now w/ bacteremia w/ MRSA.  Pharmacy has been consulted for vancomycin dosing.  Vancomycin trough theraputic  Plan: Continue vancomycin 750 mg iv Q 12 hours Continue to follow  Height: 5\' 5"  (165.1 cm) Weight: 160 lb (72.6 kg) IBW/kg (Calculated) : 57  Temp (24hrs), Avg:98.3 F (36.8 C), Min:98.2 F (36.8 C), Max:98.4 F (36.9 C)  Recent Labs  Lab 04/19/17 1655 04/19/17 1708 04/19/17 1855 04/20/17 0035 04/20/17 0316 04/21/17 0308 04/21/17 1952  WBC 14.9*  --   --   --   --  13.1*  --   CREATININE 0.84  --   --   --   --  0.83  --   LATICACIDVEN  --  1.79 0.99 1.7 2.3*  --   --   VANCOTROUGH  --   --   --   --   --   --  17    Estimated Creatinine Clearance: 56.6 mL/min (by C-G formula based on SCr of 0.83 mg/dL).    Allergies  Allergen Reactions  . Penicillins Hives    Has patient had a PCN reaction causing immediate rash, facial/tongue/throat swelling, SOB or lightheadedness with hypotension: Yes Has patient had a PCN reaction causing severe rash involving mucus membranes or skin necrosis: No Has patient had a PCN reaction that required hospitalization: No Has patient had a PCN reaction occurring within the last 10 years: Yes If all of the above answers are "NO", then may proceed with Cephalosporin use.    Clementeen Hoof [Iodinated Diagnostic Agents] Nausea And Vomiting     Thank you for allowing pharmacy to be a part of this patient's care.  Anette Guarneri, PharmD 04/21/2017 8:45 PM

## 2017-04-21 NOTE — Progress Notes (Signed)
Pt continuing to c/o 8/10 pain in the left side of their back, radiating to their left breast along with mediastinal pain when coughing. Pt states that they believe the pain is related to their rheumatoid arthritis. PRN oxycodone and tylenol have been administered with minimal relief. On call NP Blount was notified and Volteran cream has been ordered.   Will continue to monitor pt closely for any changes in condition.

## 2017-04-21 NOTE — Progress Notes (Signed)
Pt had three, five second pauses. Pt asymptomatic and asleep. On call NP Blount notified. Will continue to monitor pt closely and will pass information along to first shift.

## 2017-04-21 NOTE — Progress Notes (Signed)
Pt had 6.19 second pause at 0729. Pt sleeping and asymptomatic. Notified provider Dr. Roderic Palau. Will continue to monitor pt.

## 2017-04-21 NOTE — Progress Notes (Signed)
  Echocardiogram 2D Echocardiogram has been performed.  Maguire Sime T Kaleel Schmieder 04/21/2017, 12:59 PM

## 2017-04-21 NOTE — Progress Notes (Addendum)
PROGRESS NOTE    Gabrielle Miller  EHM:[id number] DOB: Mar 01, 1940 DOA: 04/19/2017 PCP: Ocie Doyne., MD    Brief Narrative:  77 year old female brought to the hospital with shortness of breath, fever, chills and altered mental status.  Initial concern for possible pulmonary infection, but chest x-ray does not show any clear pneumonia.  She is found to have 2 out of 2 positive sets of blood cultures for staph aureus.  Currently on intravenous vancomycin.  Further workup in progress.   Assessment & Plan:   Principal Problem:   Acute on chronic respiratory failure with hypoxia (HCC) Active Problems:   Essential hypertension   GERD (gastroesophageal reflux disease)   Depression   PAF (paroxysmal atrial fibrillation) (HCC)   CHF with right heart failure (HCC)   Sepsis (HCC)   Acute metabolic encephalopathy   Acute respiratory failure with hypoxia (HCC)   Bacteremia due to Staphylococcus aureus   1. Acute respiratory failure with hypoxia.  Possibly related to acute bronchitis. No evidence of pneumonia on chest xray.  She was having cough, shortness of breath, tachypnea.  Treat supportively.  Wean down oxygen as tolerated.   2. Staph aureus bacteremia.  Patient had 2 out of 2 sets of blood cultures positive for staph aureus.  Currently on vancomycin.  Appreciate infectious disease assistance.  Repeat cultures drawn today. 2D echocardiogram has been ordered.  No obvious source of infection at this time.  Continue current treatments. Will inform cardiology service that patient will need TEE tomorrow. Will keep patient npo after midnight. 3. Atrial fibrillation.  Chads vas score of 5, she was on eliquis in the past, but this was discontinued due to history of GI bleeding.  Continue on metoprolol flecainide. Patient did have a 6 second pause while she was sleeping earlier today. She was asymptomatic and has not had any recurrence. Discussed with Dr. Domenic Polite with cardiology and will continue to  monitor patient to see if symptoms recur while she is awake and if she is symptomatic. Will decrease dose of metoprolol to 25mg  daily. 4. Hypertension.  Continue on metoprolol.  Blood pressure stable. 5. GERD.  Continue Protonix 6. Hyperlipidemia.  Continue statin.  History of right-sided heart failure.  Appears compensated at this time. 7. Continue to hold Lasix. 8. Acute metabolic encephalopathy.  Likely related to underlying infectious process.  Mental status appears to have returned to baseline.  CT head was unremarkable. 9. Anemia. No obvious source of bleeding. May have component of hemodilution. Baseline hemoglobin is not known. Will continue to follow. Check anemia panel.   DVT prophylaxis: Lovenox Code Status: Full code Family Communication: Discussed with son at the bedside Disposition Plan: Discharge home once bacteremia workup is complete.   Consultants:   Infectious disease  Procedures:     Antimicrobials:   Vancomycin 4/19 >   Subjective: Had some nausea and vomiting. Reports that she got "choked up" after eating lasagna yesterday. Reports that she occasionally gets choked up with certain foods. Tolerated other foods yesterday without any issues. Has a non productive cough. Has left sided chest discomfort since admission that is worse with cough and deep breathing.  Objective: Vitals:   04/20/17 2300 04/21/17 0809 04/21/17 0819 04/21/17 1345  BP: 127/68 (!) 155/94    Pulse: 67 78    Resp: 16 20    Temp: 98.4 F (36.9 C) 98.3 F (36.8 C)    TempSrc: Oral Oral    SpO2: 98%  98% 98%  Weight:  Height:        Intake/Output Summary (Last 24 hours) at 04/21/2017 1407 Last data filed at 04/21/2017 1100 Gross per 24 hour  Intake 2215 ml  Output 875 ml  Net 1340 ml   Filed Weights   04/19/17 1704  Weight: 72.6 kg (160 lb)    Examination:  General exam: Alert, awake, oriented x 3 Respiratory system: Clear to auscultation. Respiratory effort  normal. Cardiovascular system: irregular. No murmurs, rubs, gallops. Mild tenderness to palpation under left breast Gastrointestinal system: Abdomen is nondistended, soft and nontender. No organomegaly or masses felt. Normal bowel sounds heard. Central nervous system: Alert and oriented. No focal neurological deficits. Extremities: No C/C/E, +pedal pulses Skin: No rashes, lesions or ulcers Psychiatry: Judgement and insight appear normal. Mood & affect appropriate.    Data Reviewed: I have personally reviewed following labs and imaging studies  CBC: Recent Labs  Lab 04/19/17 1655 04/21/17 0308  WBC 14.9* 13.1*  NEUTROABS 12.4*  --   HGB 10.1* 8.1*  HCT 32.6* 26.4*  MCV 82.1 81.2  PLT 417* 892   Basic Metabolic Panel: Recent Labs  Lab 04/19/17 1655 04/21/17 0308 04/21/17 0800  NA 135 136  --   K 3.7 3.4*  --   CL 97* 103  --   CO2 24 24  --   GLUCOSE 92 103*  --   BUN 10 13  --   CREATININE 0.84 0.83  --   CALCIUM 8.5* 8.2*  --   MG  --   --  1.8   GFR: Estimated Creatinine Clearance: 56.6 mL/min (by C-G formula based on SCr of 0.83 mg/dL). Liver Function Tests: Recent Labs  Lab 04/19/17 1655  AST 23  ALT 10*  ALKPHOS 106  BILITOT 1.1  PROT 7.7  ALBUMIN 3.1*   No results for input(s): LIPASE, AMYLASE in the last 168 hours. Recent Labs  Lab 04/19/17 2020  AMMONIA 24   Coagulation Profile: No results for input(s): INR, PROTIME in the last 168 hours. Cardiac Enzymes: Recent Labs  Lab 04/19/17 1655  TROPONINI <0.03   BNP (last 3 results) No results for input(s): PROBNP in the last 8760 hours. HbA1C: No results for input(s): HGBA1C in the last 72 hours. CBG: Recent Labs  Lab 04/20/17 0743 04/20/17 1218 04/20/17 1657 04/21/17 0808 04/21/17 1219  GLUCAP 131* 101* 98 93 123*   Lipid Profile: No results for input(s): CHOL, HDL, LDLCALC, TRIG, CHOLHDL, LDLDIRECT in the last 72 hours. Thyroid Function Tests: No results for input(s): TSH, T4TOTAL,  FREET4, T3FREE, THYROIDAB in the last 72 hours. Anemia Panel: No results for input(s): VITAMINB12, FOLATE, FERRITIN, TIBC, IRON, RETICCTPCT in the last 72 hours. Sepsis Labs: Recent Labs  Lab 04/19/17 1708 04/19/17 1855 04/19/17 2320 04/20/17 0035 04/20/17 0316  PROCALCITON  --   --  0.73  --   --   LATICACIDVEN 1.79 0.99  --  1.7 2.3*    Recent Results (from the past 240 hour(s))  Blood Culture (routine x 2)     Status: Abnormal (Preliminary result)   Collection Time: 04/19/17  4:49 PM  Result Value Ref Range Status   Specimen Description BLOOD RIGHT ANTECUBITAL  Final   Special Requests   Final    BOTTLES DRAWN AEROBIC AND ANAEROBIC Blood Culture adequate volume   Culture  Setup Time   Final    GRAM POSITIVE COCCI IN BOTH AEROBIC AND ANAEROBIC BOTTLES CRITICAL RESULT CALLED TO, READ BACK BY AND VERIFIED WITH: Chelsea PHARMD  AT [id number] BY SJW    Culture (A)  Final    STAPHYLOCOCCUS AUREUS SUSCEPTIBILITIES TO FOLLOW Performed at [facility], [place] [address], [place], [place] [address]    Report Status PENDING  Incomplete  Blood Culture ID Panel (Reflexed)     Status: Abnormal   Collection Time: 04/19/17  4:49 PM  Result Value Ref Range Status   Enterococcus species NOT DETECTED NOT DETECTED Final   Listeria monocytogenes NOT DETECTED NOT DETECTED Final   Staphylococcus species DETECTED (A) NOT DETECTED Final    Comment: CRITICAL RESULT CALLED TO, READ BACK BY AND VERIFIED WITH: VERANDA BYRK PHARMD AT 0715 ON [id number] BY SJW    Staphylococcus aureus DETECTED (A) NOT DETECTED Final    Comment: Methicillin (oxacillin)-resistant Staphylococcus aureus (MRSA). MRSA is predictably resistant to beta-lactam antibiotics (except ceftaroline). Preferred therapy is vancomycin unless clinically contraindicated. Patient requires contact precautions if  hospitalized. CRITICAL RESULT CALLED TO, READ BACK BY AND VERIFIED WITH: VERANDA BYRK PHARMD AT 0715 ON [id number] BY SJW     Methicillin resistance DETECTED (A) NOT DETECTED Final    Comment: CRITICAL RESULT CALLED TO, READ BACK BY AND VERIFIED WITH: VERANDA BYRK PHARMD  AT 0715 ON [id number] BY SJW    Streptococcus species NOT DETECTED NOT DETECTED Final   Streptococcus agalactiae NOT DETECTED NOT DETECTED Final   Streptococcus pneumoniae NOT DETECTED NOT DETECTED Final   Streptococcus pyogenes NOT DETECTED NOT DETECTED Final   Acinetobacter baumannii NOT DETECTED NOT DETECTED Final   Enterobacteriaceae species NOT DETECTED NOT DETECTED Final   Enterobacter cloacae complex NOT DETECTED NOT DETECTED Final   Escherichia coli NOT DETECTED NOT DETECTED Final   Klebsiella oxytoca NOT DETECTED NOT DETECTED Final   Klebsiella pneumoniae NOT DETECTED NOT DETECTED Final   Proteus species NOT DETECTED NOT DETECTED Final   Serratia marcescens NOT DETECTED NOT DETECTED Final   Haemophilus influenzae NOT DETECTED NOT DETECTED Final   Neisseria meningitidis NOT DETECTED NOT DETECTED Final   Pseudomonas aeruginosa NOT DETECTED NOT DETECTED Final   Candida albicans NOT DETECTED NOT DETECTED Final   Candida glabrata NOT DETECTED NOT DETECTED Final   Candida krusei NOT DETECTED NOT DETECTED Final   Candida parapsilosis NOT DETECTED NOT DETECTED Final   Candida tropicalis NOT DETECTED NOT DETECTED Final    Comment: Performed at [facility], [place] [address] [place], [place] [address]  Urine culture     Status: None   Collection Time: 04/19/17  5:01 PM  Result Value Ref Range Status   Specimen Description URINE, CATHETERIZED  Final   Special Requests Normal  Final   Culture   Final    NO GROWTH Performed at [facility], [address] [place], [place] [address]    Report Status 04/20/2017 FINAL  Final  Blood Culture (routine x 2)     Status: Abnormal (Preliminary result)   Collection Time: 04/19/17  5:08 PM  Result Value Ref Range Status   Specimen Description BLOOD LEFT ANTECUBITAL  Final   Special Requests    Final    BOTTLES DRAWN AEROBIC AND ANAEROBIC Blood Culture adequate volume   Culture  Setup Time   Final    GRAM POSITIVE COCCI IN BOTH AEROBIC AND ANAEROBIC BOTTLES CRITICAL VALUE NOTED.  VALUE IS CONSISTENT WITH PREVIOUSLY REPORTED AND CALLED VALUE. Performed at [facility], [place] [address], [place], [place] [address]    Culture STAPHYLOCOCCUS AUREUS (A)  Final   Report Status  PENDING  Incomplete         Radiology Studies: Ct Head Wo Contrast  Result Date: 04/19/2017 CLINICAL DATA:  Initial evaluation for acute altered mental status. EXAM: CT HEAD WITHOUT CONTRAST TECHNIQUE: Contiguous axial images were obtained from the base of the skull through the vertex without intravenous contrast. COMPARISON:  None. FINDINGS: Brain: Generalized age-related cerebral atrophy with mild chronic small vessel ischemic disease. No acute intracranial hemorrhage. No acute large vessel territory infarct. No mass lesion, midline shift or mass effect. No hydrocephalus. No extra-axial fluid collection. Vascular: No hyperdense vessel. Scattered vascular calcifications noted within the carotid siphons. Skull: No scalp soft tissue abnormality. Calvarium intact. Bones are diffusely demineralized. Sinuses/Orbits: Globes orbital soft tissues within normal limits. Scattered mucosal thickening within the ethmoidal air cells. Paranasal sinuses are otherwise clear. No mastoid effusion. Other: None. IMPRESSION: 1. No acute intracranial abnormality. 2. Age-related cerebral atrophy with mild chronic small vessel ischemic disease. 3. Mild ethmoidal sinusitis. Electronically Signed   By: Jeannine Boga M.D.   On: 04/19/2017 23:31   Dg Chest Port 1 View  Result Date: 04/19/2017 CLINICAL DATA:  Shortness of breath and fever. EXAM: PORTABLE CHEST 1 VIEW COMPARISON:  Single-view of the chest 06/30/2016 and 11/12/2015. CT chest 08/11/2010. FINDINGS: There is cardiomegaly without edema. Aortic atherosclerosis is noted.  Mild right basilar atelectasis is seen. Hiatal hernia is noted. IMPRESSION: Cardiomegaly without acute disease. Hiatal hernia. Atherosclerosis. Electronically Signed   By: Inge Rise M.D.   On: 04/19/2017 17:19        Scheduled Meds: . diclofenac sodium  2 g Topical QID  . DULoxetine  30 mg Oral Daily  . enoxaparin (LOVENOX) injection  40 mg Subcutaneous Q24H  . flecainide  50 mg Oral BID  . ipratropium-albuterol  3 mL Nebulization TID  . mouth rinse  15 mL Mouth Rinse BID  . metoprolol succinate  25 mg Oral Daily  . pantoprazole  40 mg Oral Daily  . pravastatin  20 mg Oral Daily   Continuous Infusions: . sodium chloride 75 mL/hr at 04/20/17 1935  . vancomycin Stopped (04/21/17 0940)     LOS: 2 days    Time spent: 20mins    Kathie Dike, MD [facility] Pager [phone]  If 7PM-7AM, please contact night-coverage [email or web address] Password [facility] 04/21/2017, 2:07 PM

## 2017-04-21 NOTE — Progress Notes (Signed)
Attempted to wean O2 X 2.  SpO2 on 1L ~ 98%, room air 88-90% at rest.  Returned to 1L, notified RN

## 2017-04-21 NOTE — Progress Notes (Signed)
  Echocardiogram 2D Echocardiogram has been performed.  Deziyah Arvin T Dajaun Goldring 04/21/2017, 1:00 PM

## 2017-04-22 ENCOUNTER — Inpatient Hospital Stay (HOSPITAL_COMMUNITY): Payer: Medicare Other

## 2017-04-22 DIAGNOSIS — I700 Atherosclerosis of aorta: Secondary | ICD-10-CM

## 2017-04-22 DIAGNOSIS — M545 Low back pain: Secondary | ICD-10-CM

## 2017-04-22 DIAGNOSIS — M546 Pain in thoracic spine: Secondary | ICD-10-CM

## 2017-04-22 DIAGNOSIS — R0902 Hypoxemia: Secondary | ICD-10-CM

## 2017-04-22 LAB — CULTURE, BLOOD (ROUTINE X 2)
Special Requests: ADEQUATE
Special Requests: ADEQUATE

## 2017-04-22 LAB — BASIC METABOLIC PANEL
Anion gap: 7 (ref 5–15)
BUN: 9 mg/dL (ref 6–20)
CALCIUM: 8.2 mg/dL — AB (ref 8.9–10.3)
CO2: 22 mmol/L (ref 22–32)
CREATININE: 0.71 mg/dL (ref 0.44–1.00)
Chloride: 105 mmol/L (ref 101–111)
Glucose, Bld: 107 mg/dL — ABNORMAL HIGH (ref 65–99)
Potassium: 3.6 mmol/L (ref 3.5–5.1)
Sodium: 134 mmol/L — ABNORMAL LOW (ref 135–145)

## 2017-04-22 LAB — CBC
HEMATOCRIT: 28.3 % — AB (ref 36.0–46.0)
HEMOGLOBIN: 8.8 g/dL — AB (ref 12.0–15.0)
MCH: 25.1 pg — AB (ref 26.0–34.0)
MCHC: 31.1 g/dL (ref 30.0–36.0)
MCV: 80.9 fL (ref 78.0–100.0)
Platelets: 379 10*3/uL (ref 150–400)
RBC: 3.5 MIL/uL — ABNORMAL LOW (ref 3.87–5.11)
RDW: 17.7 % — ABNORMAL HIGH (ref 11.5–15.5)
WBC: 11.2 10*3/uL — ABNORMAL HIGH (ref 4.0–10.5)

## 2017-04-22 LAB — FERRITIN: Ferritin: 131 ng/mL (ref 11–307)

## 2017-04-22 LAB — IRON AND TIBC
Iron: 11 ug/dL — ABNORMAL LOW (ref 28–170)
Saturation Ratios: 5 % — ABNORMAL LOW (ref 10.4–31.8)
TIBC: 211 ug/dL — ABNORMAL LOW (ref 250–450)
UIBC: 200 ug/dL

## 2017-04-22 LAB — GLUCOSE, CAPILLARY
GLUCOSE-CAPILLARY: 106 mg/dL — AB (ref 65–99)
GLUCOSE-CAPILLARY: 87 mg/dL (ref 65–99)

## 2017-04-22 LAB — VITAMIN B12: Vitamin B-12: 280 pg/mL (ref 180–914)

## 2017-04-22 LAB — RETICULOCYTES
RBC.: 3.5 MIL/uL — ABNORMAL LOW (ref 3.87–5.11)
RETIC CT PCT: 1.3 % (ref 0.4–3.1)
Retic Count, Absolute: 45.5 10*3/uL (ref 19.0–186.0)

## 2017-04-22 LAB — FOLATE: FOLATE: 13.3 ng/mL (ref >5.9)

## 2017-04-22 MED ORDER — IPRATROPIUM-ALBUTEROL 0.5-2.5 (3) MG/3ML IN SOLN
3.0000 mL | Freq: Two times a day (BID) | RESPIRATORY_TRACT | Status: DC
  Administered 2017-04-22 – 2017-04-24 (×3): 3 mL via RESPIRATORY_TRACT
  Filled 2017-04-22 (×5): qty 3

## 2017-04-22 MED ORDER — SODIUM CHLORIDE 0.9 % IV SOLN: INTRAVENOUS | Status: DC

## 2017-04-22 MED ORDER — GADOBENATE DIMEGLUMINE 529 MG/ML IV SOLN
15.0000 mL | Freq: Once | INTRAVENOUS | Status: AC
  Administered 2017-04-22: 15 mL via INTRAVENOUS

## 2017-04-22 MED ORDER — SODIUM CHLORIDE 0.9 % IV SOLN
INTRAVENOUS | Status: AC
  Administered 2017-04-22: 12:00:00 via INTRAVENOUS

## 2017-04-22 NOTE — Progress Notes (Signed)
    [facility] has been requested to perform a transesophageal echocardiogram on 04/23/17 for evaluation of bacteremia.  After careful review of history and examination, the risks and benefits of transesophageal echocardiogram have been explained including risks of esophageal damage, perforation (1:10,000 risk), bleeding, pharyngeal hematoma as well as other potential complications associated with conscious sedation including aspiration, arrhythmia, respiratory failure and death. Alternatives to treatment were discussed, questions were answered. Patient is willing to proceed.   TEE scheduled for 04/23/17 at 9am with Dr. Tyna Jaksch, PA-C 04/22/2017 1:32 PM

## 2017-04-22 NOTE — Care Management Important Message (Signed)
Important Message  Patient Details  Name: Gabrielle Miller MRN: [id number] Date of Birth: 1940/10/12   Medicare Important Message Given:  Yes    Zenon Mayo, RN 04/22/2017, 4:23 PM

## 2017-04-22 NOTE — Progress Notes (Signed)
[facility] PROGRESS NOTE  Gabrielle Miller:[id number] DOB: [birth year] DOA: 04/19/2017 PCP: Ocie Doyne., MD  Brief summary   77 year old female brought to the hospital with shortness of breath, fever, chills and altered mental status.  Initial concern for possible pulmonary infection, but chest x-ray does not show any clear pneumonia.  She is found to have 2 out of 2 positive sets of blood cultures for staph aureus.  Currently on intravenous vancomycin.  Further workup in progress.   Assessment/Plan:  Acute respiratory failure with hypoxia.  Possibly related to acute bronchitis. No evidence of pneumonia on chest xray.  She was having cough, shortness of breath, tachypnea.  Treat supportively.  Weaned down oxygen as tolerated.  resolved   Staph aureus bacteremia.  Patient had 2 out of 2 sets of blood cultures positive for staph aureus.  Currently on vancomycin.  Appreciate infectious disease assistance.  Repeat cultures drawn-pend. 2D echocardiogram: no obvious vegetations.  No obvious source of infection at this time.  Continue current treatments. D/w cardiology, being scheduled for  TEE tomorrow. obtain lumbar MRI.   Atrial fibrillation.  Chads vas score of 5, she was on eliquis in the past, but this was discontinued due to history of GI bleeding.  Continue on metoprolol flecainide. Patient did have a 6 second pause while she was sleeping . She was asymptomatic and has not had any recurrence. Hospitalist discussed with Dr. Domenic Polite with cardiology and continue to monitor patient to see if symptoms recur while she is awake and if she is symptomatic. decreased dose of metoprolol to 25mg  daily. Resume lasix AM, if not NPO.   Hypertension.  Continue on metoprolol.  Blood pressure stable.  GERD.  Continue Protonix  Hyperlipidemia.  Continue statin.  History of right-sided heart failure.  Appears compensated at this time.  Acute metabolic encephalopathy.  Likely related to underlying  infectious process.  Mental status appears to have returned to baseline.  CT head was unremarkable.  Anemia. No obvious source of bleeding. May have component of hemodilution. Baseline hemoglobin is not known. continue to follow. Start iron    Code Status: full Family Communication: d/w patient, Therapist, sports (indicate person spoken with, relationship, and if by phone, the number) Disposition Plan: home pend clinical improvement    Consultants:  ID  Cardiology   Procedures:  Pend TEE  Antibiotics: Anti-infectives (From admission, onward)   Start     Dose/Rate Route Frequency Ordered Stop   04/20/17 0800  vancomycin (VANCOCIN) IVPB 750 mg/150 ml premix     750 mg 150 mL/hr over 60 Minutes Intravenous Every 12 hours 04/20/17 0732     04/20/17 0530  vancomycin (VANCOCIN) IVPB 750 mg/150 ml premix  Status:  Discontinued     750 mg 150 mL/hr over 60 Minutes Intravenous Every 12 hours 04/19/17 2040 04/19/17 2241   04/20/17 0100  piperacillin-tazobactam (ZOSYN) IVPB 3.375 g  Status:  Discontinued     3.375 g 12.5 mL/hr over 240 Minutes Intravenous Every 8 hours 04/19/17 2040 04/19/17 2241   04/19/17 2330  levofloxacin (LEVAQUIN) IVPB 500 mg  Status:  Discontinued     500 mg 100 mL/hr over 60 Minutes Intravenous Every 24 hours 04/19/17 2259 04/20/17 0721   04/19/17 1715  piperacillin-tazobactam (ZOSYN) IVPB 3.375 g     3.375 g 100 mL/hr over 30 Minutes Intravenous  Once 04/19/17 1701 04/19/17 1745   04/19/17 1715  vancomycin (VANCOCIN) IVPB 1000 mg/200 mL premix  Status:  Discontinued  1,000 mg 200 mL/hr over 60 Minutes Intravenous  Once 04/19/17 1701 04/19/17 1704   04/19/17 1715  vancomycin (VANCOCIN) 1,750 mg in sodium chloride 0.9 % 500 mL IVPB     1,750 mg 250 mL/hr over 120 Minutes Intravenous  Once 04/19/17 1704 04/19/17 1928        (indicate start date, and stop date if known)  HPI/Subjective: Reports feeling well. No acute issues. D/w cardiology, being scheduled for  TEE  Objective: Vitals:   04/22/17 0557 04/22/17 0831  BP:    Pulse:    Resp:    Temp:    SpO2: 99% 99%    Intake/Output Summary (Last 24 hours) at 04/22/2017 0841 Last data filed at 04/22/2017 0400 Gross per 24 hour  Intake 2205 ml  Output 625 ml  Net 1580 ml   Filed Weights   04/19/17 1704 04/22/17 0619  Weight: 72.6 kg (160 lb) 77.8 kg (171 lb 8.3 oz)    Exam:   General:  No distress   Cardiovascular: s1,s2 rrr  Respiratory: CTA BL  Abdomen: soft, nt  Musculoskeletal:  No leg edema    Data Reviewed: Basic Metabolic Panel: Recent Labs  Lab 04/19/17 1655 04/21/17 0308 04/21/17 0800 04/22/17 0248  NA 135 136  --  134*  K 3.7 3.4*  --  3.6  CL 97* 103  --  105  CO2 24 24  --  22  GLUCOSE 92 103*  --  107*  BUN 10 13  --  9  CREATININE 0.84 0.83  --  0.71  CALCIUM 8.5* 8.2*  --  8.2*  MG  --   --  1.8  --    Liver Function Tests: Recent Labs  Lab 04/19/17 1655  AST 23  ALT 10*  ALKPHOS 106  BILITOT 1.1  PROT 7.7  ALBUMIN 3.1*   No results for input(s): LIPASE, AMYLASE in the last 168 hours. Recent Labs  Lab 04/19/17 2020  AMMONIA 24   CBC: Recent Labs  Lab 04/19/17 1655 04/21/17 0308 04/22/17 0647  WBC 14.9* 13.1* 11.2*  NEUTROABS 12.4*  --   --   HGB 10.1* 8.1* 8.8*  HCT 32.6* 26.4* 28.3*  MCV 82.1 81.2 80.9  PLT 417* 353 379   Cardiac Enzymes: Recent Labs  Lab 04/19/17 1655  TROPONINI <0.03   BNP (last 3 results) Recent Labs    04/19/17 1655  BNP 322.8*    ProBNP (last 3 results) No results for input(s): PROBNP in the last 8760 hours.  CBG: Recent Labs  Lab 04/21/17 0808 04/21/17 1219 04/21/17 1626 04/21/17 2102 04/22/17 0752  GLUCAP 93 123* 114* 159* 106*    Recent Results (from the past 240 hour(s))  Blood Culture (routine x 2)     Status: Abnormal (Preliminary result)   Collection Time: 04/19/17  4:49 PM  Result Value Ref Range Status   Specimen Description BLOOD RIGHT ANTECUBITAL  Final   Special  Requests   Final    BOTTLES DRAWN AEROBIC AND ANAEROBIC Blood Culture adequate volume   Culture  Setup Time   Final    GRAM POSITIVE COCCI IN BOTH AEROBIC AND ANAEROBIC BOTTLES CRITICAL RESULT CALLED TO, READ BACK BY AND VERIFIED WITH: VERANDA [place] PHARMD AT [phone] [id number] BY SJW    Culture (A)  Final    STAPHYLOCOCCUS AUREUS SUSCEPTIBILITIES TO FOLLOW Performed at [facility], [place] [address], [place], [place] [address]    Report Status PENDING  Incomplete  Blood Culture  ID Panel (Reflexed)     Status: Abnormal   Collection Time: 04/19/17  4:49 PM  Result Value Ref Range Status   Enterococcus species NOT DETECTED NOT DETECTED Final   Listeria monocytogenes NOT DETECTED NOT DETECTED Final   Staphylococcus species DETECTED (A) NOT DETECTED Final    Comment: CRITICAL RESULT CALLED TO, READ BACK BY AND VERIFIED WITH: VERANDA BYRK PHARMD AT 0715 ON [id number] BY SJW    Staphylococcus aureus DETECTED (A) NOT DETECTED Final    Comment: Methicillin (oxacillin)-resistant Staphylococcus aureus (MRSA). MRSA is predictably resistant to beta-lactam antibiotics (except ceftaroline). Preferred therapy is vancomycin unless clinically contraindicated. Patient requires contact precautions if  hospitalized. CRITICAL RESULT CALLED TO, READ BACK BY AND VERIFIED WITH: VERANDA BYRK PHARMD AT 0715 ON [id number] BY SJW    Methicillin resistance DETECTED (A) NOT DETECTED Final    Comment: CRITICAL RESULT CALLED TO, READ BACK BY AND VERIFIED WITH: VERANDA BYRK PHARMD  AT 0715 ON [id number] BY SJW    Streptococcus species NOT DETECTED NOT DETECTED Final   Streptococcus agalactiae NOT DETECTED NOT DETECTED Final   Streptococcus pneumoniae NOT DETECTED NOT DETECTED Final   Streptococcus pyogenes NOT DETECTED NOT DETECTED Final   Acinetobacter baumannii NOT DETECTED NOT DETECTED Final   Enterobacteriaceae species NOT DETECTED NOT DETECTED Final   Enterobacter cloacae complex NOT DETECTED NOT DETECTED Final    Escherichia coli NOT DETECTED NOT DETECTED Final   Klebsiella oxytoca NOT DETECTED NOT DETECTED Final   Klebsiella pneumoniae NOT DETECTED NOT DETECTED Final   Proteus species NOT DETECTED NOT DETECTED Final   Serratia marcescens NOT DETECTED NOT DETECTED Final   Haemophilus influenzae NOT DETECTED NOT DETECTED Final   Neisseria meningitidis NOT DETECTED NOT DETECTED Final   Pseudomonas aeruginosa NOT DETECTED NOT DETECTED Final   Candida albicans NOT DETECTED NOT DETECTED Final   Candida glabrata NOT DETECTED NOT DETECTED Final   Candida krusei NOT DETECTED NOT DETECTED Final   Candida parapsilosis NOT DETECTED NOT DETECTED Final   Candida tropicalis NOT DETECTED NOT DETECTED Final    Comment: Performed at [facility], [place]. [address], [place], [place] [address]  Urine culture     Status: None   Collection Time: 04/19/17  5:01 PM  Result Value Ref Range Status   Specimen Description URINE, CATHETERIZED  Final   Special Requests Normal  Final   Culture   Final    NO GROWTH Performed at [facility], [address], [place], [place] [address]    Report Status 04/20/2017 FINAL  Final  Blood Culture (routine x 2)     Status: Abnormal (Preliminary result)   Collection Time: 04/19/17  5:08 PM  Result Value Ref Range Status   Specimen Description BLOOD LEFT ANTECUBITAL  Final   Special Requests   Final    BOTTLES DRAWN AEROBIC AND ANAEROBIC Blood Culture adequate volume   Culture  Setup Time   Final    GRAM POSITIVE COCCI IN BOTH AEROBIC AND ANAEROBIC BOTTLES CRITICAL VALUE NOTED.  VALUE IS CONSISTENT WITH PREVIOUSLY REPORTED AND CALLED VALUE. Performed at [facility], [place] [address], [place],  [address]    Culture STAPHYLOCOCCUS AUREUS (A)  Final   Report Status PENDING  Incomplete     Studies: No results found.  Scheduled Meds: . diclofenac sodium  2 g Topical QID  . DULoxetine  30 mg Oral Daily  . enoxaparin (LOVENOX) injection  40 mg Subcutaneous  Q24H  . flecainide  50 mg Oral BID  . ipratropium-albuterol  3 mL Nebulization TID  . mouth rinse  15 mL Mouth Rinse BID  . metoprolol succinate  25 mg Oral Daily  . pantoprazole  40 mg Oral Daily  . pravastatin  20 mg Oral Daily   Continuous Infusions: . sodium chloride 75 mL/hr at 04/21/17 2027  . vancomycin 750 mg (04/22/17 0815)    Principal Problem:   Acute on chronic respiratory failure with hypoxia (HCC) Active Problems:   Essential hypertension   GERD (gastroesophageal reflux disease)   Depression   PAF (paroxysmal atrial fibrillation) (HCC)   CHF with right heart failure (HCC)   Sepsis (HCC)   Acute metabolic encephalopathy   Acute respiratory failure with hypoxia ([place])   Bacteremia due to Staphylococcus aureus    Time spent: >35 minutes     Kinnie Feil  [facility] Pager [phone]. If 7PM-7AM, please contact night-coverage at [email or web address], password [email or web address] 04/22/2017, 8:41 AM  LOS: 3 days

## 2017-04-22 NOTE — Progress Notes (Signed)
Subjective: No new complaints   Antibiotics:  Anti-infectives (From admission, onward)   Start     Dose/Rate Route Frequency Ordered Stop   04/20/17 0800  vancomycin (VANCOCIN) IVPB 750 mg/150 ml premix     750 mg 150 mL/hr over 60 Minutes Intravenous Every 12 hours 04/20/17 0732     04/20/17 0530  vancomycin (VANCOCIN) IVPB 750 mg/150 ml premix  Status:  Discontinued     750 mg 150 mL/hr over 60 Minutes Intravenous Every 12 hours 04/19/17 2040 04/19/17 2241   04/20/17 0100  piperacillin-tazobactam (ZOSYN) IVPB 3.375 g  Status:  Discontinued     3.375 g 12.5 mL/hr over 240 Minutes Intravenous Every 8 hours 04/19/17 2040 04/19/17 2241   04/19/17 2330  levofloxacin (LEVAQUIN) IVPB 500 mg  Status:  Discontinued     500 mg 100 mL/hr over 60 Minutes Intravenous Every 24 hours 04/19/17 2259 04/20/17 0721   04/19/17 1715  piperacillin-tazobactam (ZOSYN) IVPB 3.375 g     3.375 g 100 mL/hr over 30 Minutes Intravenous  Once 04/19/17 1701 04/19/17 1745   04/19/17 1715  vancomycin (VANCOCIN) IVPB 1000 mg/200 mL premix  Status:  Discontinued     1,000 mg 200 mL/hr over 60 Minutes Intravenous  Once 04/19/17 1701 04/19/17 1704   04/19/17 1715  vancomycin (VANCOCIN) 1,750 mg in sodium chloride 0.9 % 500 mL IVPB     1,750 mg 250 mL/hr over 120 Minutes Intravenous  Once 04/19/17 1704 04/19/17 1928      Medications: Scheduled Meds: . diclofenac sodium  2 g Topical QID  . DULoxetine  30 mg Oral Daily  . enoxaparin (LOVENOX) injection  40 mg Subcutaneous Q24H  . flecainide  50 mg Oral BID  . ipratropium-albuterol  3 mL Nebulization BID  . mouth rinse  15 mL Mouth Rinse BID  . metoprolol succinate  25 mg Oral Daily  . pantoprazole  40 mg Oral Daily  . pravastatin  20 mg Oral Daily   Continuous Infusions: . sodium chloride 75 mL/hr at 04/22/17 1153  . vancomycin Stopped (04/22/17 0915)   PRN Meds:.acetaminophen, albuterol, dextromethorphan-guaiFENesin, hydrALAZINE, ondansetron  (ZOFRAN) IV, oxyCODONE-acetaminophen **AND** oxyCODONE, zolpidem    Objective: Weight change:   Intake/Output Summary (Last 24 hours) at 04/22/2017 1154 Last data filed at 04/22/2017 0815 Gross per 24 hour  Intake 2385 ml  Output 400 ml  Net 1985 ml   Blood pressure 137/76, pulse 73, temperature 97.8 F (36.6 C), temperature source Oral, resp. rate (!) 22, height 5\' 5"  (1.651 m), weight 171 lb 8.3 oz (77.8 kg), SpO2 99 %. Temp:  [97.8 F (36.6 C)-98.2 F (36.8 C)] 97.8 F (36.6 C) (04/22 0113) Pulse Rate:  [73-91] 73 (04/22 0113) Resp:  [22] 22 (04/21 1626) BP: (137-158)/(76-93) 137/76 (04/22 0113) SpO2:  [98 %-100 %] 99 % (04/22 0831) Weight:  [171 lb 8.3 oz (77.8 kg)] 171 lb 8.3 oz (77.8 kg) (04/22 7829)  Physical Exam: General: Alert and awake, oriented x3, not in any acute distress. HEENT: anicteric sclera, pupils reactive to light and accommodation, EOMI CVS regular rate, normal r,  no murmur rubs or gallops Chest: clear to auscultation bilaterally, no wheezing, rales or rhonchi Abdomen: soft nontender, nondistended, normal bowel sounds, Extremities: no  clubbing or edema noted bilaterally Skin: no rashes Lymph: no new lymphadenopathy Neuro: nonfocal  CBC: @LABBLAST3 (wbc3,Hgb:3,Hct:3,Plt:3,INR:3APTT:3)@   BMET Recent Labs    04/21/17 0308 04/22/17 0248  NA 136 134*  K 3.4* 3.6  CL 103 105  CO2 24 22  GLUCOSE 103* 107*  BUN 13 9  CREATININE 0.83 0.71  CALCIUM 8.2* 8.2*     Liver Panel  Recent Labs    04/19/17 1655  PROT 7.7  ALBUMIN 3.1*  AST 23  ALT 10*  ALKPHOS 106  BILITOT 1.1       Sedimentation Rate No results for input(s): ESRSEDRATE in the last 72 hours. C-Reactive Protein No results for input(s): CRP in the last 72 hours.  Micro Results: Recent Results (from the past 720 hour(s))  Blood Culture (routine x 2)     Status: Abnormal   Collection Time: 04/19/17  4:49 PM  Result Value Ref Range Status   Specimen Description BLOOD  RIGHT ANTECUBITAL  Final   Special Requests   Final    BOTTLES DRAWN AEROBIC AND ANAEROBIC Blood Culture adequate volume   Culture  Setup Time   Final    GRAM POSITIVE COCCI IN BOTH AEROBIC AND ANAEROBIC BOTTLES CRITICAL RESULT CALLED TO, READ BACK BY AND VERIFIED WITH: VERANDA [place] PHARMD AT [phone] [id number] BY SJW Performed at [facility], [place] [address] [place], [place] [address]    Culture METHICILLIN RESISTANT STAPHYLOCOCCUS AUREUS (A)  Final   Report Status 04/22/2017 FINAL  Final   Organism ID, Bacteria METHICILLIN RESISTANT STAPHYLOCOCCUS AUREUS  Final      Susceptibility   Methicillin resistant staphylococcus aureus - MIC*    CIPROFLOXACIN >=8 RESISTANT Resistant     ERYTHROMYCIN >=8 RESISTANT Resistant     GENTAMICIN <=0.5 SENSITIVE Sensitive     OXACILLIN >=4 RESISTANT Resistant     TETRACYCLINE <=1 SENSITIVE Sensitive     VANCOMYCIN <=0.5 SENSITIVE Sensitive     TRIMETH/SULFA <=10 SENSITIVE Sensitive     CLINDAMYCIN <=0.25 SENSITIVE Sensitive     RIFAMPIN <=0.5 SENSITIVE Sensitive     Inducible Clindamycin NEGATIVE Sensitive     * METHICILLIN RESISTANT STAPHYLOCOCCUS AUREUS  Blood Culture ID Panel (Reflexed)     Status: Abnormal   Collection Time: 04/19/17  4:49 PM  Result Value Ref Range Status   Enterococcus species NOT DETECTED NOT DETECTED Final   Listeria monocytogenes NOT DETECTED NOT DETECTED Final   Staphylococcus species DETECTED (A) NOT DETECTED Final    Comment: CRITICAL RESULT CALLED TO, READ BACK BY AND VERIFIED WITH: VERANDA BYRK PHARMD AT 0715 ON [id number] BY SJW    Staphylococcus aureus DETECTED (A) NOT DETECTED Final    Comment: Methicillin (oxacillin)-resistant Staphylococcus aureus (MRSA). MRSA is predictably resistant to beta-lactam antibiotics (except ceftaroline). Preferred therapy is vancomycin unless clinically contraindicated. Patient requires contact precautions if  hospitalized. CRITICAL RESULT CALLED TO, READ BACK BY AND VERIFIED  WITH: VERANDA BYRK PHARMD AT 0715 ON [id number] BY SJW    Methicillin resistance DETECTED (A) NOT DETECTED Final    Comment: CRITICAL RESULT CALLED TO, READ BACK BY AND VERIFIED WITH: VERANDA BYRK PHARMD  AT 0715 ON [id number] BY SJW    Streptococcus species NOT DETECTED NOT DETECTED Final   Streptococcus agalactiae NOT DETECTED NOT DETECTED Final   Streptococcus pneumoniae NOT DETECTED NOT DETECTED Final   Streptococcus pyogenes NOT DETECTED NOT DETECTED Final   Acinetobacter baumannii NOT DETECTED NOT DETECTED Final   Enterobacteriaceae species NOT DETECTED NOT DETECTED Final   Enterobacter cloacae complex NOT DETECTED NOT DETECTED Final   Escherichia coli NOT DETECTED NOT DETECTED Final   Klebsiella oxytoca NOT DETECTED NOT DETECTED Final   Klebsiella pneumoniae NOT DETECTED NOT DETECTED Final  Proteus species NOT DETECTED NOT DETECTED Final   Serratia marcescens NOT DETECTED NOT DETECTED Final   Haemophilus influenzae NOT DETECTED NOT DETECTED Final   Neisseria meningitidis NOT DETECTED NOT DETECTED Final   Pseudomonas aeruginosa NOT DETECTED NOT DETECTED Final   Candida albicans NOT DETECTED NOT DETECTED Final   Candida glabrata NOT DETECTED NOT DETECTED Final   Candida krusei NOT DETECTED NOT DETECTED Final   Candida parapsilosis NOT DETECTED NOT DETECTED Final   Candida tropicalis NOT DETECTED NOT DETECTED Final    Comment: Performed at [facility], [place] [address], [place], [place] [address]  Urine culture     Status: None   Collection Time: 04/19/17  5:01 PM  Result Value Ref Range Status   Specimen Description URINE, CATHETERIZED  Final   Special Requests Normal  Final   Culture   Final    NO GROWTH Performed at [facility], [address] [place], [place] [address]    Report Status 04/20/2017 FINAL  Final  Blood Culture (routine x 2)     Status: Abnormal   Collection Time: 04/19/17  5:08 PM  Result Value Ref Range Status   Specimen Description BLOOD LEFT  ANTECUBITAL  Final   Special Requests   Final    BOTTLES DRAWN AEROBIC AND ANAEROBIC Blood Culture adequate volume   Culture  Setup Time   Final    GRAM POSITIVE COCCI IN BOTH AEROBIC AND ANAEROBIC BOTTLES CRITICAL VALUE NOTED.  VALUE IS CONSISTENT WITH PREVIOUSLY REPORTED AND CALLED VALUE.    Culture (A)  Final    STAPHYLOCOCCUS AUREUS SUSCEPTIBILITIES PERFORMED ON PREVIOUS CULTURE WITHIN THE LAST 5 DAYS. Performed at [facility], [place] [address], [place], [place] [address]    Report Status 04/22/2017 FINAL  Final    Studies/Results: No results found.    Assessment/Plan:  INTERVAL HISTORY: TTE showed Aortic valve: Mildly calcified annulus.   Principal Problem:   Acute on chronic respiratory failure with hypoxia (HCC) Active Problems:   Essential hypertension   GERD (gastroesophageal reflux disease)   Depression   PAF (paroxysmal atrial fibrillation) (HCC)   CHF with right heart failure (HCC)   Sepsis ([place])   Acute metabolic encephalopathy   Acute respiratory failure with hypoxia (HCC)   Bacteremia due to Staphylococcus aureus    Gabrielle Miller is a 77 y.o. female with  MRSA bacteremia of unclear source.  She DOES complain of lower and mid back pain.  #1      [facility] Team Staphylococcus aureus bacteremia   Staphylococcus aureus bacteremia (SAB) is associated with a high rate of complications and mortality.  Specific aspects of clinical management are critical to optimizing the outcome of patients with SAB.  Therefore, the [facility] Antimicrobial Management Team [facility]) has initiated an intervention aimed at improving the management of SAB at [facility].  To do so, Infectious Diseases physicians are providing an evidence-based consult for the management of all patients with SAB.     Yes No Comments  Perform follow-up blood cultures (even if the patient is afebrile) to ensure clearance of bacteremia [x]  []  Repeat blood cultures  cooking  Remove vascular catheter and obtain follow-up blood cultures after the removal of the catheter [x]  []  DO NOT PLACE PICC OR CENTRAL LINE  Perform echocardiography to evaluate for endocarditis (transthoracic ECHO is 40-50% sensitive, TEE is > 90% sensitive) [x]  []  Please keep in mind, that neither test can definitively EXCLUDE endocarditis,  and that should clinical suspicion remain high for endocarditis the patient should then still be treated with an "endocarditis" duration of therapy = 6 weeks  She needs TEE  Consult electrophysiologist to evaluate implanted cardiac device (pacemaker, ICD) []  []    Ensure source control []  []  Have all abscesses been drained effectively? Have deep seeded infections (septic joints or osteomyelitis) had appropriate surgical debridement?  Source is not clear  Investigate for "metastatic" sites of infection []  []  Does the patient have ANY symptom or physical exam finding that would suggest a deeper infection (back or neck pain that may be suggestive of vertebral osteomyelitis or epidural abscess, muscle pain that could be a symptom of pyomyositis)?  Keep in mind that for deep seeded infections MRI imaging with contrast is preferred rather than other often insensitive tests such as plain x-rays, especially early in a patient's presentation.  MRI of L spine is ordered by me  Change antibiotic therapy to vancomycin [x]  []  Beta-lactam antibiotics are preferred for MSSA due to higher cure rates.   If on Vancomycin, goal trough should be 15 - 20 mcg/mL  Estimated duration of IV antibiotic therapy:  6 weels [x]  []  Consult case management for probably prolonged outpatient IV antibiotic therapy      LOS: 3 days   Alcide Evener 04/22/2017, 11:54 AM

## 2017-04-22 NOTE — H&P (View-Only) (Signed)
Subjective: No new complaints   Antibiotics:  Anti-infectives (From admission, onward)   Start     Dose/Rate Route Frequency Ordered Stop   04/20/17 0800  vancomycin (VANCOCIN) IVPB 750 mg/150 ml premix     750 mg 150 mL/hr over 60 Minutes Intravenous Every 12 hours 04/20/17 0732     04/20/17 0530  vancomycin (VANCOCIN) IVPB 750 mg/150 ml premix  Status:  Discontinued     750 mg 150 mL/hr over 60 Minutes Intravenous Every 12 hours 04/19/17 2040 04/19/17 2241   04/20/17 0100  piperacillin-tazobactam (ZOSYN) IVPB 3.375 g  Status:  Discontinued     3.375 g 12.5 mL/hr over 240 Minutes Intravenous Every 8 hours 04/19/17 2040 04/19/17 2241   04/19/17 2330  levofloxacin (LEVAQUIN) IVPB 500 mg  Status:  Discontinued     500 mg 100 mL/hr over 60 Minutes Intravenous Every 24 hours 04/19/17 2259 04/20/17 0721   04/19/17 1715  piperacillin-tazobactam (ZOSYN) IVPB 3.375 g     3.375 g 100 mL/hr over 30 Minutes Intravenous  Once 04/19/17 1701 04/19/17 1745   04/19/17 1715  vancomycin (VANCOCIN) IVPB 1000 mg/200 mL premix  Status:  Discontinued     1,000 mg 200 mL/hr over 60 Minutes Intravenous  Once 04/19/17 1701 04/19/17 1704   04/19/17 1715  vancomycin (VANCOCIN) 1,750 mg in sodium chloride 0.9 % 500 mL IVPB     1,750 mg 250 mL/hr over 120 Minutes Intravenous  Once 04/19/17 1704 04/19/17 1928      Medications: Scheduled Meds: . diclofenac sodium  2 g Topical QID  . DULoxetine  30 mg Oral Daily  . enoxaparin (LOVENOX) injection  40 mg Subcutaneous Q24H  . flecainide  50 mg Oral BID  . ipratropium-albuterol  3 mL Nebulization BID  . mouth rinse  15 mL Mouth Rinse BID  . metoprolol succinate  25 mg Oral Daily  . pantoprazole  40 mg Oral Daily  . pravastatin  20 mg Oral Daily   Continuous Infusions: . sodium chloride 75 mL/hr at 04/22/17 1153  . vancomycin Stopped (04/22/17 0915)   PRN Meds:.acetaminophen, albuterol, dextromethorphan-guaiFENesin, hydrALAZINE, ondansetron  (ZOFRAN) IV, oxyCODONE-acetaminophen **AND** oxyCODONE, zolpidem    Objective: Weight change:   Intake/Output Summary (Last 24 hours) at 04/22/2017 1154 Last data filed at 04/22/2017 0815 Gross per 24 hour  Intake 2385 ml  Output 400 ml  Net 1985 ml   Blood pressure 137/76, pulse 73, temperature 97.8 F (36.6 C), temperature source Oral, resp. rate (!) 22, height 5\' 5"  (1.651 m), weight 171 lb 8.3 oz (77.8 kg), SpO2 99 %. Temp:  [97.8 F (36.6 C)-98.2 F (36.8 C)] 97.8 F (36.6 C) (04/22 0113) Pulse Rate:  [73-91] 73 (04/22 0113) Resp:  [22] 22 (04/21 1626) BP: (137-158)/(76-93) 137/76 (04/22 0113) SpO2:  [98 %-100 %] 99 % (04/22 0831) Weight:  [171 lb 8.3 oz (77.8 kg)] 171 lb 8.3 oz (77.8 kg) (04/22 1610)  Physical Exam: General: Alert and awake, oriented x3, not in any acute distress. HEENT: anicteric sclera, pupils reactive to light and accommodation, EOMI CVS regular rate, normal r,  no murmur rubs or gallops Chest: clear to auscultation bilaterally, no wheezing, rales or rhonchi Abdomen: soft nontender, nondistended, normal bowel sounds, Extremities: no  clubbing or edema noted bilaterally Skin: no rashes Lymph: no new lymphadenopathy Neuro: nonfocal  CBC: @LABBLAST3 (wbc3,Hgb:3,Hct:3,Plt:3,INR:3APTT:3)@   BMET Recent Labs    04/21/17 0308 04/22/17 0248  NA 136 134*  K 3.4* 3.6  CL 103 105  CO2 24 22  GLUCOSE 103* 107*  BUN 13 9  CREATININE 0.83 0.71  CALCIUM 8.2* 8.2*     Liver Panel  Recent Labs    04/19/17 1655  PROT 7.7  ALBUMIN 3.1*  AST 23  ALT 10*  ALKPHOS 106  BILITOT 1.1       Sedimentation Rate No results for input(s): ESRSEDRATE in the last 72 hours. C-Reactive Protein No results for input(s): CRP in the last 72 hours.  Micro Results: Recent Results (from the past 720 hour(s))  Blood Culture (routine x 2)     Status: Abnormal   Collection Time: 04/19/17  4:49 PM  Result Value Ref Range Status   Specimen Description BLOOD  RIGHT ANTECUBITAL  Final   Special Requests   Final    BOTTLES DRAWN AEROBIC AND ANAEROBIC Blood Culture adequate volume   Culture  Setup Time   Final    GRAM POSITIVE COCCI IN BOTH AEROBIC AND ANAEROBIC BOTTLES CRITICAL RESULT CALLED TO, READ BACK BY AND VERIFIED WITH: VERANDA [place] PHARMD AT [id number] [id number] BY SJW Performed at [facility], [place] [address] [place], [place] [address]    Culture METHICILLIN RESISTANT STAPHYLOCOCCUS AUREUS (A)  Final   Report Status 04/22/2017 FINAL  Final   Organism ID, Bacteria METHICILLIN RESISTANT STAPHYLOCOCCUS AUREUS  Final      Susceptibility   Methicillin resistant staphylococcus aureus - MIC*    CIPROFLOXACIN >=8 RESISTANT Resistant     ERYTHROMYCIN >=8 RESISTANT Resistant     GENTAMICIN <=0.5 SENSITIVE Sensitive     OXACILLIN >=4 RESISTANT Resistant     TETRACYCLINE <=1 SENSITIVE Sensitive     VANCOMYCIN <=0.5 SENSITIVE Sensitive     TRIMETH/SULFA <=10 SENSITIVE Sensitive     CLINDAMYCIN <=0.25 SENSITIVE Sensitive     RIFAMPIN <=0.5 SENSITIVE Sensitive     Inducible Clindamycin NEGATIVE Sensitive     * METHICILLIN RESISTANT STAPHYLOCOCCUS AUREUS  Blood Culture ID Panel (Reflexed)     Status: Abnormal   Collection Time: 04/19/17  4:49 PM  Result Value Ref Range Status   Enterococcus species NOT DETECTED NOT DETECTED Final   Listeria monocytogenes NOT DETECTED NOT DETECTED Final   Staphylococcus species DETECTED (A) NOT DETECTED Final    Comment: CRITICAL RESULT CALLED TO, READ BACK BY AND VERIFIED WITH: VERANDA BYRK PHARMD AT 0715 ON [id number] BY SJW    Staphylococcus aureus DETECTED (A) NOT DETECTED Final    Comment: Methicillin (oxacillin)-resistant Staphylococcus aureus (MRSA). MRSA is predictably resistant to beta-lactam antibiotics (except ceftaroline). Preferred therapy is vancomycin unless clinically contraindicated. Patient requires contact precautions if  hospitalized. CRITICAL RESULT CALLED TO, READ BACK BY AND VERIFIED  WITH: VERANDA BYRK PHARMD AT 0715 ON [id number] BY SJW    Methicillin resistance DETECTED (A) NOT DETECTED Final    Comment: CRITICAL RESULT CALLED TO, READ BACK BY AND VERIFIED WITH: VERANDA BYRK PHARMD  AT 0715 ON [id number] BY SJW    Streptococcus species NOT DETECTED NOT DETECTED Final   Streptococcus agalactiae NOT DETECTED NOT DETECTED Final   Streptococcus pneumoniae NOT DETECTED NOT DETECTED Final   Streptococcus pyogenes NOT DETECTED NOT DETECTED Final   Acinetobacter baumannii NOT DETECTED NOT DETECTED Final   Enterobacteriaceae species NOT DETECTED NOT DETECTED Final   Enterobacter cloacae complex NOT DETECTED NOT DETECTED Final   Escherichia coli NOT DETECTED NOT DETECTED Final   Klebsiella oxytoca NOT DETECTED NOT DETECTED Final   Klebsiella pneumoniae NOT DETECTED NOT DETECTED Final  Proteus species NOT DETECTED NOT DETECTED Final   Serratia marcescens NOT DETECTED NOT DETECTED Final   Haemophilus influenzae NOT DETECTED NOT DETECTED Final   Neisseria meningitidis NOT DETECTED NOT DETECTED Final   Pseudomonas aeruginosa NOT DETECTED NOT DETECTED Final   Candida albicans NOT DETECTED NOT DETECTED Final   Candida glabrata NOT DETECTED NOT DETECTED Final   Candida krusei NOT DETECTED NOT DETECTED Final   Candida parapsilosis NOT DETECTED NOT DETECTED Final   Candida tropicalis NOT DETECTED NOT DETECTED Final    Comment: Performed at [facility], [place] [address], [place], [place] [address]  Urine culture     Status: None   Collection Time: 04/19/17  5:01 PM  Result Value Ref Range Status   Specimen Description URINE, CATHETERIZED  Final   Special Requests Normal  Final   Culture   Final    NO GROWTH Performed at [facility], [address], [place], [place] [address]    Report Status 04/20/2017 FINAL  Final  Blood Culture (routine x 2)     Status: Abnormal   Collection Time: 04/19/17  5:08 PM  Result Value Ref Range Status   Specimen Description BLOOD LEFT  ANTECUBITAL  Final   Special Requests   Final    BOTTLES DRAWN AEROBIC AND ANAEROBIC Blood Culture adequate volume   Culture  Setup Time   Final    GRAM POSITIVE COCCI IN BOTH AEROBIC AND ANAEROBIC BOTTLES CRITICAL VALUE NOTED.  VALUE IS CONSISTENT WITH PREVIOUSLY REPORTED AND CALLED VALUE.    Culture (A)  Final    STAPHYLOCOCCUS AUREUS SUSCEPTIBILITIES PERFORMED ON PREVIOUS CULTURE WITHIN THE LAST 5 DAYS. Performed at [facility], [place] [address] [place], [place] [address]    Report Status 04/22/2017 FINAL  Final    Studies/Results: No results found.    Assessment/Plan:  INTERVAL HISTORY: TTE showed Aortic valve: Mildly calcified annulus.   Principal Problem:   Acute on chronic respiratory failure with hypoxia (HCC) Active Problems:   Essential hypertension   GERD (gastroesophageal reflux disease)   Depression   PAF (paroxysmal atrial fibrillation) (HCC)   CHF with right heart failure (HCC)   Sepsis ([place])   Acute metabolic encephalopathy   Acute respiratory failure with hypoxia (HCC)   Bacteremia due to Staphylococcus aureus    Gabrielle Miller is a 77 y.o. female with  MRSA bacteremia of unclear source.  She DOES complain of lower and mid back pain.  #1      [place] [facility] Staphylococcus aureus bacteremia   Staphylococcus aureus bacteremia (SAB) is associated with a high rate of complications and mortality.  Specific aspects of clinical management are critical to optimizing the outcome of patients with SAB.  Therefore, the [facility] Team [facility]) has initiated an intervention aimed at improving the management of SAB at [facility].  To do so, Infectious Diseases physicians are providing an evidence-based consult for the management of all patients with SAB.     Yes No Comments  Perform follow-up blood cultures (even if the patient is afebrile) to ensure clearance of bacteremia [x]  []  Repeat blood cultures  cooking  Remove vascular catheter and obtain follow-up blood cultures after the removal of the catheter [x]  []  DO NOT PLACE PICC OR CENTRAL LINE  Perform echocardiography to evaluate for endocarditis (transthoracic ECHO is 40-50% sensitive, TEE is > 90% sensitive) [x]  []  Please keep in mind, that neither test can definitively EXCLUDE endocarditis,  and that should clinical suspicion remain high for endocarditis the patient should then still be treated with an "endocarditis" duration of therapy = 6 weeks  She needs TEE  Consult electrophysiologist to evaluate implanted cardiac device (pacemaker, ICD) []  []    Ensure source control []  []  Have all abscesses been drained effectively? Have deep seeded infections (septic joints or osteomyelitis) had appropriate surgical debridement?  Source is not clear  Investigate for "metastatic" sites of infection []  []  Does the patient have ANY symptom or physical exam finding that would suggest a deeper infection (back or neck pain that may be suggestive of vertebral osteomyelitis or epidural abscess, muscle pain that could be a symptom of pyomyositis)?  Keep in mind that for deep seeded infections MRI imaging with contrast is preferred rather than other often insensitive tests such as plain x-rays, especially early in a patient's presentation.  MRI of L spine is ordered by me  Change antibiotic therapy to vancomycin [x]  []  Beta-lactam antibiotics are preferred for MSSA due to higher cure rates.   If on Vancomycin, goal trough should be 15 - 20 mcg/mL  Estimated duration of IV antibiotic therapy:  6 weels [x]  []  Consult case management for probably prolonged outpatient IV antibiotic therapy      LOS: 3 days   Alcide Evener 04/22/2017, 11:54 AM

## 2017-04-23 ENCOUNTER — Inpatient Hospital Stay (HOSPITAL_COMMUNITY): Payer: Medicare Other

## 2017-04-23 ENCOUNTER — Encounter (HOSPITAL_COMMUNITY)
Admission: EM | Disposition: A | Discharge status: Home Health | Payer: Self-pay | Source: Home / Self Care | Attending: Internal Medicine

## 2017-04-23 ENCOUNTER — Encounter (HOSPITAL_COMMUNITY): Payer: Self-pay | Admitting: Emergency Medicine

## 2017-04-23 DIAGNOSIS — J029 Acute pharyngitis, unspecified: Secondary | ICD-10-CM

## 2017-04-23 DIAGNOSIS — I351 Nonrheumatic aortic (valve) insufficiency: Secondary | ICD-10-CM

## 2017-04-23 DIAGNOSIS — I358 Other nonrheumatic aortic valve disorders: Secondary | ICD-10-CM

## 2017-04-23 DIAGNOSIS — I330 Acute and subacute infective endocarditis: Secondary | ICD-10-CM

## 2017-04-23 HISTORY — PX: TEE WITHOUT CARDIOVERSION: SHX5443

## 2017-04-23 SURGERY — ECHOCARDIOGRAM, TRANSESOPHAGEAL
Anesthesia: Moderate Sedation

## 2017-04-23 MED ORDER — FENTANYL CITRATE (PF) 100 MCG/2ML IJ SOLN
INTRAMUSCULAR | Status: AC
  Filled 2017-04-23: qty 2

## 2017-04-23 MED ORDER — MIDAZOLAM HCL 5 MG/ML IJ SOLN
INTRAMUSCULAR | Status: AC
  Filled 2017-04-23: qty 2

## 2017-04-23 MED ORDER — FENTANYL CITRATE (PF) 100 MCG/2ML IJ SOLN
INTRAMUSCULAR | Status: DC | PRN
  Administered 2017-04-23 (×2): 25 ug via INTRAVENOUS

## 2017-04-23 MED ORDER — PHENOL 1.4 % MT LIQD
1.0000 | OROMUCOSAL | Status: DC | PRN
  Administered 2017-04-23 – 2017-04-24 (×2): 1 via OROMUCOSAL
  Filled 2017-04-23: qty 177

## 2017-04-23 MED ORDER — MIDAZOLAM HCL 5 MG/ML IJ SOLN
INTRAMUSCULAR | Status: DC | PRN
  Administered 2017-04-23 (×5): 1 mg via INTRAVENOUS

## 2017-04-23 MED ORDER — BUTAMBEN-TETRACAINE-BENZOCAINE 2-2-14 % EX AERO
INHALATION_SPRAY | CUTANEOUS | Status: DC | PRN
  Administered 2017-04-23: 2 via TOPICAL

## 2017-04-23 NOTE — Progress Notes (Signed)
  Echocardiogram Echocardiogram Transesophageal has been performed.  Nikitha Mode G Ercel Normoyle 04/23/2017, 10:12 AM

## 2017-04-23 NOTE — Progress Notes (Signed)
TEE preliminary note:  1. Thickening and calcification of aortic valve leaflets with probable vegetation noted. 2. Moderate aortic and mitral regurgitation. 3. Mild to moderate tricuspid regurgitation. 4. Normal LV systolic function, LVEF 67-12%.  * Full report to follow.

## 2017-04-23 NOTE — Plan of Care (Signed)
  Problem: Pain Managment: Goal: General experience of comfort will improve Outcome: Not Progressing   

## 2017-04-23 NOTE — Progress Notes (Signed)
[facility] Infusion Coordinator will follow pt with ID to provide home IV ABX at DC as ordered.   [facility] will work with patient's [facility] agency of choice.  If patient discharges after hours, please call [phone].   Larry Sierras 04/23/2017, 4:38 PM

## 2017-04-23 NOTE — Progress Notes (Addendum)
[facility] for Infectious Disease  Date of Admission:  04/19/2017     Total days of antibiotics 4   Vancomycin Day 4         Patient ID: Gabrielle Miller is a 77 y.o. female with  Principal Problem:   Aortic valve endocarditis Active Problems:   Sepsis ([place])   MRSA bacteremia   Essential hypertension   GERD (gastroesophageal reflux disease)   Depression   PAF (paroxysmal atrial fibrillation) (HCC)   CHF with right heart failure (HCC)   Acute on chronic respiratory failure with hypoxia (HCC)   Acute metabolic encephalopathy   Acute respiratory failure with hypoxia (HCC)   Hypoxemia   . diclofenac sodium  2 g Topical QID  . DULoxetine  30 mg Oral Daily  . enoxaparin (LOVENOX) injection  40 mg Subcutaneous Q24H  . flecainide  50 mg Oral BID  . ipratropium-albuterol  3 mL Nebulization BID  . mouth rinse  15 mL Mouth Rinse BID  . metoprolol succinate  25 mg Oral Daily  . pantoprazole  40 mg Oral Daily  . pravastatin  20 mg Oral Daily    SUBJECTIVE: Back from TEE - her throat is very sore and hurts a lot when she swallows.    Allergies  Allergen Reactions  . Penicillins Hives    Has patient had a PCN reaction causing immediate rash, facial/tongue/throat swelling, SOB or lightheadedness with hypotension: Yes Has patient had a PCN reaction causing severe rash involving mucus membranes or skin necrosis: No Has patient had a PCN reaction that required hospitalization: No Has patient had a PCN reaction occurring within the last 10 years: Yes If all of the above answers are "NO", then may proceed with Cephalosporin use.    Clementeen Hoof [Iodinated Diagnostic Agents] Nausea And Vomiting    OBJECTIVE: Vitals:   04/23/17 0935 04/23/17 0952 04/23/17 1000 04/23/17 1008  BP: (!) 175/98 (!) 171/96  (!) 176/110  Pulse: 99 93 92   Resp: (!) 24 17 19    Temp:  98.5 F (36.9 C)    TempSrc:      SpO2: 97% 97% 96%   Weight:      Height:       Body mass index is 28.46  kg/m.  Physical Exam  Constitutional: She is oriented to person, place, and time. She appears well-developed and well-nourished.  Seated on side of bed. Appears uncomfortable.   HENT:  Mouth/Throat: Mucous membranes are normal. No oral lesions. Normal dentition. No dental abscesses. No oropharyngeal exudate.  Cardiovascular: Normal rate, regular rhythm, normal heart sounds and intact distal pulses.  No murmur heard. Pulmonary/Chest: Effort normal and breath sounds normal. No respiratory distress. She has no rales.  Abdominal: Soft. She exhibits no distension. There is no tenderness.  Lymphadenopathy:    She has no cervical adenopathy.  Neurological: She is alert and oriented to person, place, and time.  Skin: Skin is warm and dry. No rash noted.  Psychiatric: She has a normal mood and affect. Judgment normal.    Lab Results Lab Results  Component Value Date   WBC 11.2 (H) 04/22/2017   HGB 8.8 (L) 04/22/2017   HCT 28.3 (L) 04/22/2017   MCV 80.9 04/22/2017   PLT 379 04/22/2017    Lab Results  Component Value Date   CREATININE 0.71 04/22/2017   BUN 9 04/22/2017   NA 134 (L) 04/22/2017   K 3.6 04/22/2017   CL  105 04/22/2017   CO2 22 04/22/2017    Lab Results  Component Value Date   ALT 10 (L) 04/19/2017   AST 23 04/19/2017   ALKPHOS 106 04/19/2017   BILITOT 1.1 04/19/2017     Microbiology: Recent Results (from the past 240 hour(s))  Blood Culture (routine x 2)     Status: Abnormal   Collection Time: 04/19/17  4:49 PM  Result Value Ref Range Status   Specimen Description BLOOD RIGHT ANTECUBITAL  Final   Special Requests   Final    BOTTLES DRAWN AEROBIC AND ANAEROBIC Blood Culture adequate volume   Culture  Setup Time   Final    GRAM POSITIVE COCCI IN BOTH AEROBIC AND ANAEROBIC BOTTLES CRITICAL RESULT CALLED TO, READ BACK BY AND VERIFIED WITH: VERANDA [place] PHARMD AT [place] [id number] BY SJW Performed at [facility], [place] [address] [place], [place] [address]     Culture METHICILLIN RESISTANT STAPHYLOCOCCUS AUREUS (A)  Final   Report Status 04/22/2017 FINAL  Final   Organism ID, Bacteria METHICILLIN RESISTANT STAPHYLOCOCCUS AUREUS  Final      Susceptibility   Methicillin resistant staphylococcus aureus - MIC*    CIPROFLOXACIN >=8 RESISTANT Resistant     ERYTHROMYCIN >=8 RESISTANT Resistant     GENTAMICIN <=0.5 SENSITIVE Sensitive     OXACILLIN >=4 RESISTANT Resistant     TETRACYCLINE <=1 SENSITIVE Sensitive     VANCOMYCIN <=0.5 SENSITIVE Sensitive     TRIMETH/SULFA <=10 SENSITIVE Sensitive     CLINDAMYCIN <=0.25 SENSITIVE Sensitive     RIFAMPIN <=0.5 SENSITIVE Sensitive     Inducible Clindamycin NEGATIVE Sensitive     * METHICILLIN RESISTANT STAPHYLOCOCCUS AUREUS  Blood Culture ID Panel (Reflexed)     Status: Abnormal   Collection Time: 04/19/17  4:49 PM  Result Value Ref Range Status   Enterococcus species NOT DETECTED NOT DETECTED Final   Listeria monocytogenes NOT DETECTED NOT DETECTED Final   Staphylococcus species DETECTED (A) NOT DETECTED Final    Comment: CRITICAL RESULT CALLED TO, READ BACK BY AND VERIFIED WITH: VERANDA BYRK PHARMD AT 0715 ON [id number] BY SJW    Staphylococcus aureus DETECTED (A) NOT DETECTED Final    Comment: Methicillin (oxacillin)-resistant Staphylococcus aureus (MRSA). MRSA is predictably resistant to beta-lactam antibiotics (except ceftaroline). Preferred therapy is vancomycin unless clinically contraindicated. Patient requires contact precautions if  hospitalized. CRITICAL RESULT CALLED TO, READ BACK BY AND VERIFIED WITH: VERANDA BYRK PHARMD AT 0715 ON [id number] BY SJW    Methicillin resistance DETECTED (A) NOT DETECTED Final    Comment: CRITICAL RESULT CALLED TO, READ BACK BY AND VERIFIED WITH: VERANDA BYRK PHARMD  AT 0715 ON [id number] BY SJW    Streptococcus species NOT DETECTED NOT DETECTED Final   Streptococcus agalactiae NOT DETECTED NOT DETECTED Final   Streptococcus pneumoniae NOT DETECTED NOT DETECTED Final     Streptococcus pyogenes NOT DETECTED NOT DETECTED Final   Acinetobacter baumannii NOT DETECTED NOT DETECTED Final   Enterobacteriaceae species NOT DETECTED NOT DETECTED Final   Enterobacter cloacae complex NOT DETECTED NOT DETECTED Final   Escherichia coli NOT DETECTED NOT DETECTED Final   Klebsiella oxytoca NOT DETECTED NOT DETECTED Final   Klebsiella pneumoniae NOT DETECTED NOT DETECTED Final   Proteus species NOT DETECTED NOT DETECTED Final   Serratia marcescens NOT DETECTED NOT DETECTED Final   Haemophilus influenzae NOT DETECTED NOT DETECTED Final   Neisseria meningitidis NOT DETECTED NOT DETECTED Final   Pseudomonas aeruginosa NOT DETECTED NOT DETECTED Final  Candida albicans NOT DETECTED NOT DETECTED Final   Candida glabrata NOT DETECTED NOT DETECTED Final   Candida krusei NOT DETECTED NOT DETECTED Final   Candida parapsilosis NOT DETECTED NOT DETECTED Final   Candida tropicalis NOT DETECTED NOT DETECTED Final    Comment: Performed at [facility], [place] [address] [place], [place] [address]  Urine culture     Status: None   Collection Time: 04/19/17  5:01 PM  Result Value Ref Range Status   Specimen Description URINE, CATHETERIZED  Final   Special Requests Normal  Final   Culture   Final    NO GROWTH Performed at [facility], [address] [place], [place] [address]    Report Status 04/20/2017 FINAL  Final  Blood Culture (routine x 2)     Status: Abnormal   Collection Time: 04/19/17  5:08 PM  Result Value Ref Range Status   Specimen Description BLOOD LEFT ANTECUBITAL  Final   Special Requests   Final    BOTTLES DRAWN AEROBIC AND ANAEROBIC Blood Culture adequate volume   Culture  Setup Time   Final    GRAM POSITIVE COCCI IN BOTH AEROBIC AND ANAEROBIC BOTTLES CRITICAL VALUE NOTED.  VALUE IS CONSISTENT WITH PREVIOUSLY REPORTED AND CALLED VALUE.    Culture (A)  Final    STAPHYLOCOCCUS AUREUS SUSCEPTIBILITIES PERFORMED ON PREVIOUS CULTURE WITHIN THE LAST 5  DAYS. Performed at [facility], [place] [address] [place], [place] [address]    Report Status 04/22/2017 FINAL  Final  Culture, blood (routine x 2)     Status: None (Preliminary result)   Collection Time: 04/21/17 12:58 PM  Result Value Ref Range Status   Specimen Description BLOOD RIGHT ANTECUBITAL  Final   Special Requests AEROBIC BOTTLE ONLY Blood Culture adequate volume  Final   Culture   Final    NO GROWTH 2 DAYS Performed at [facility], [place] [address], [place], [place] [address]    Report Status PENDING  Incomplete  Culture, blood (routine x 2)     Status: None (Preliminary result)   Collection Time: 04/21/17  1:00 PM  Result Value Ref Range Status   Specimen Description BLOOD RIGHT ANTECUBITAL  Final   Special Requests AEROBIC BOTTLE ONLY Blood Culture adequate volume  Final   Culture   Final    NO GROWTH 2 DAYS Performed at [facility], [place] [address] [place], [place] [address]    Report Status PENDING  Incomplete   ASSESSMENT: 77 y.o. F with MRSA bacteremia in setting of AV endocarditis. I discussed the results with her today and discussed regarding plan of care. She will need PICC line placement in a few days pending clearance of final cultures. She takes care of a 17 and 26 yo grandchildren but has other people that are in the home frequently that can come help with her infusions and currently caring for her grandchildren.   PLAN: 1. Continue vancomycin  2. Monitor renal function  3. Will need 6 weeks of therapy after clearance of blood - TBD for now 4. Hold for PICC until cultures finalized.  5. She has a very sore throat following procedure - will order her chloraseptic spray for now.       [facility] Staphylococcus aureus bacteremia   Staphylococcus aureus bacteremia (SAB) is associated with a high rate of complications and mortality.  Specific aspects of clinical management are critical to optimizing the outcome  of patients with SAB.  Therefore, the [facility]imicrobial Management Team [facility]) has initiated an intervention aimed at improving the management of SAB at [facility].  To do so, Infectious Diseases physicians are providing an evidence-based consult for the management of all patients with SAB.     Yes No Comments  Perform follow-up blood cultures (even if the patient is afebrile) to ensure clearance of bacteremia [x]  []  Repeat BCx NG at 48h  Remove vascular catheter and obtain follow-up blood cultures after the removal of the catheter [x]  []  No line on admit - please HOLD on PICC for now   Perform echocardiography to evaluate for endocarditis (transthoracic ECHO is 40-50% sensitive, TEE is > 90% sensitive) [x]  []  TEE 4/23 with AoV calcification and likely vegetation => will need 6 weeks IV   Consult electrophysiologist to evaluate implanted cardiac device (pacemaker, ICD) []  [x]  N/A   Ensure source control [x]  []  No surgery indicated for endocarditis - functionally no AI   Investigate for "metastatic" sites of infection [x]  []  No further fevers and ROS/exam negative for other metastatic sites. Endocarditis identified..  Change antibiotic therapy to VANCOMYCIN  [x]  []  Beta-lactam antibiotics are preferred for MSSA due to higher cure rates.   If on Vancomycin, goal trough should be 15 - 20 mcg/mL  Estimated duration of IV antibiotic therapy:   [x]  []  6 weeks duration - discussed with Pam with [facility] and notified about case.     Janene Madeira, MSN, NP-C [facility] for Infectious [place] Cell: [phone] Pager: [phone]  04/23/2017  4:10 PM

## 2017-04-23 NOTE — Progress Notes (Signed)
[facility] PROGRESS NOTE  Gabrielle Miller OFB:[id number] DOB: 1940/06/18 DOA: 04/19/2017 PCP: Ocie Doyne., MD  Brief summary   77 year old female brought to the hospital with shortness of breath, fever, chills and altered mental status.  Initial concern for possible pulmonary infection, but chest x-ray does not show any clear pneumonia.  She is found to have 2 out of 2 positive sets of blood cultures for staph aureus.  Currently on intravenous vancomycin.  Patient underwent TEE today, 04/23/2017 and the finding is stated to be worrisome for possible Aortic valve vegetation..   Assessment/Plan:  Acute respiratory failure with hypoxia.  Possibly related to acute bronchitis. No evidence of pneumonia on chest xray.  She was having cough, shortness of breath, tachypnea.  Treat supportively.  Weaned down oxygen as tolerated.  resolved   Staph aureus bacteremia.  Patient had 2 out of 2 sets of blood cultures positive for staph aureus.  Currently on vancomycin.  Appreciate infectious disease assistance.  Repeat cultures drawn-pend. 2D echocardiogram: no obvious vegetations.  No obvious source of infection at this time.  Continue current treatments. D/w cardiology, being scheduled for  TEE tomorrow. obtain lumbar MRI.  TEE done today 04/23/2017 was worrisome for possible aortic valve vegetation.  Atrial fibrillation.  Chads vas score of 5, she was on eliquis in the past, but this was discontinued due to history of GI bleeding.  Continue on metoprolol flecainide. Patient did have a 6 second pause while she was sleeping . She was asymptomatic and has not had any recurrence. Hospitalist discussed with Dr. Domenic Polite with cardiology and continue to monitor patient to see if symptoms recur while she is awake and if she is symptomatic. decreased dose of metoprolol to 25mg  daily. Resume lasix AM, if not NPO.   Hypertension.  Continue on metoprolol.  Blood pressure stable.  GERD.  Continue  Protonix  Hyperlipidemia.  Continue statin.  History of right-sided heart failure.  Appears compensated at this time.  Acute metabolic encephalopathy.  Likely related to underlying infectious process.  Mental status appears to have returned to baseline.  CT head was unremarkable.  Anemia. No obvious source of bleeding. May have component of hemodilution. Baseline hemoglobin is not known. continue to follow. Start iron    Code Status: full Family Communication:  Disposition Plan: home pend clinical improvement    Consultants:  ID  Cardiology   Procedures:       TEE  Antibiotics: Anti-infectives (From admission, onward)   Start     Dose/Rate Route Frequency Ordered Stop   04/20/17 0800  vancomycin (VANCOCIN) IVPB 750 mg/150 ml premix     750 mg 150 mL/hr over 60 Minutes Intravenous Every 12 hours 04/20/17 0732     04/20/17 0530  vancomycin (VANCOCIN) IVPB 750 mg/150 ml premix  Status:  Discontinued     750 mg 150 mL/hr over 60 Minutes Intravenous Every 12 hours 04/19/17 2040 04/19/17 2241   04/20/17 0100  piperacillin-tazobactam (ZOSYN) IVPB 3.375 g  Status:  Discontinued     3.375 g 12.5 mL/hr over 240 Minutes Intravenous Every 8 hours 04/19/17 2040 04/19/17 2241   04/19/17 2330  levofloxacin (LEVAQUIN) IVPB 500 mg  Status:  Discontinued     500 mg 100 mL/hr over 60 Minutes Intravenous Every 24 hours 04/19/17 2259 04/20/17 0721   04/19/17 1715  piperacillin-tazobactam (ZOSYN) IVPB 3.375 g     3.375 g 100 mL/hr over 30 Minutes Intravenous  Once 04/19/17 1701 04/19/17 1745   04/19/17 1715  vancomycin (VANCOCIN) IVPB 1000 mg/200 mL premix  Status:  Discontinued     1,000 mg 200 mL/hr over 60 Minutes Intravenous  Once 04/19/17 1701 04/19/17 1704   04/19/17 1715  vancomycin (VANCOCIN) 1,750 mg in sodium chloride 0.9 % 500 mL IVPB     1,750 mg 250 mL/hr over 120 Minutes Intravenous  Once 04/19/17 1704 04/19/17 1928       (indicate start date, and stop date if  known)  HPI/Subjective: Reports feeling well. No acute issues. D/w cardiology, being scheduled for TEE  Objective: Vitals:   04/23/17 1000 04/23/17 1008  BP:  (!) 176/110  Pulse: 92   Resp: 19   Temp:    SpO2: 96%     Intake/Output Summary (Last 24 hours) at 04/23/2017 1416 Last data filed at 04/23/2017 0200 Gross per 24 hour  Intake 1298.75 ml  Output 1000 ml  Net 298.75 ml   Filed Weights   04/19/17 1704 04/22/17 0619 04/23/17 0829  Weight: 72.6 kg (160 lb) 77.8 kg (171 lb 8.3 oz) 77.6 kg (171 lb)    Exam:   General:  No distress.  Awake and alert.  Cardiovascular: S1 and S2.    Respiratory: CTA BL  Abdomen: soft, nt.  Events are difficult to assess.  Bronson symptoms  Musculoskeletal:  No leg edema  Neuro exam: This is nonfocal.  Patient moves all limbs.  Data Reviewed: Basic Metabolic Panel: Recent Labs  Lab 04/19/17 1655 04/21/17 0308 04/21/17 0800 04/22/17 0248  NA 135 136  --  134*  K 3.7 3.4*  --  3.6  CL 97* 103  --  105  CO2 24 24  --  22  GLUCOSE 92 103*  --  107*  BUN 10 13  --  9  CREATININE 0.84 0.83  --  0.71  CALCIUM 8.5* 8.2*  --  8.2*  MG  --   --  1.8  --    Liver Function Tests: Recent Labs  Lab 04/19/17 1655  AST 23  ALT 10*  ALKPHOS 106  BILITOT 1.1  PROT 7.7  ALBUMIN 3.1*   No results for input(s): LIPASE, AMYLASE in the last 168 hours. Recent Labs  Lab 04/19/17 2020  AMMONIA 24   CBC: Recent Labs  Lab 04/19/17 1655 04/21/17 0308 04/22/17 0647  WBC 14.9* 13.1* 11.2*  NEUTROABS 12.4*  --   --   HGB 10.1* 8.1* 8.8*  HCT 32.6* 26.4* 28.3*  MCV 82.1 81.2 80.9  PLT 417* 353 379   Cardiac Enzymes: Recent Labs  Lab 04/19/17 1655  TROPONINI <0.03   BNP (last 3 results) Recent Labs    04/19/17 1655  BNP 322.8*    ProBNP (last 3 results) No results for input(s): PROBNP in the last 8760 hours.  CBG: Recent Labs  Lab 04/21/17 1219 04/21/17 1626 04/21/17 2102 04/22/17 0752 04/22/17 1216  GLUCAP  123* 114* 159* 106* 87    Recent Results (from the past 240 hour(s))  Blood Culture (routine x 2)     Status: Abnormal   Collection Time: 04/19/17  4:49 PM  Result Value Ref Range Status   Specimen Description BLOOD RIGHT ANTECUBITAL  Final   Special Requests   Final    BOTTLES DRAWN AEROBIC AND ANAEROBIC Blood Culture adequate volume   Culture  Setup Time   Final    GRAM POSITIVE COCCI IN BOTH AEROBIC AND ANAEROBIC BOTTLES CRITICAL RESULT CALLED TO, READ BACK BY AND VERIFIED WITH: VERANDA Newport PHARMD AT  0715 [id number] BY SJW Performed at [facility], [place] [address] [place], [place] [address]    Culture METHICILLIN RESISTANT STAPHYLOCOCCUS AUREUS (A)  Final   Report Status 04/22/2017 FINAL  Final   Organism ID, Bacteria METHICILLIN RESISTANT STAPHYLOCOCCUS AUREUS  Final      Susceptibility   Methicillin resistant staphylococcus aureus - MIC*    CIPROFLOXACIN >=8 RESISTANT Resistant     ERYTHROMYCIN >=8 RESISTANT Resistant     GENTAMICIN <=0.5 SENSITIVE Sensitive     OXACILLIN >=4 RESISTANT Resistant     TETRACYCLINE <=1 SENSITIVE Sensitive     VANCOMYCIN <=0.5 SENSITIVE Sensitive     TRIMETH/SULFA <=10 SENSITIVE Sensitive     CLINDAMYCIN <=0.25 SENSITIVE Sensitive     RIFAMPIN <=0.5 SENSITIVE Sensitive     Inducible Clindamycin NEGATIVE Sensitive     * METHICILLIN RESISTANT STAPHYLOCOCCUS AUREUS  Blood Culture ID Panel (Reflexed)     Status: Abnormal   Collection Time: 04/19/17  4:49 PM  Result Value Ref Range Status   Enterococcus species NOT DETECTED NOT DETECTED Final   Listeria monocytogenes NOT DETECTED NOT DETECTED Final   Staphylococcus species DETECTED (A) NOT DETECTED Final    Comment: CRITICAL RESULT CALLED TO, READ BACK BY AND VERIFIED WITH: VERANDA BYRK PHARMD AT 0715 ON [id number] BY SJW    Staphylococcus aureus DETECTED (A) NOT DETECTED Final    Comment: Methicillin (oxacillin)-resistant Staphylococcus aureus (MRSA). MRSA is predictably resistant to  beta-lactam antibiotics (except ceftaroline). Preferred therapy is vancomycin unless clinically contraindicated. Patient requires contact precautions if  hospitalized. CRITICAL RESULT CALLED TO, READ BACK BY AND VERIFIED WITH: VERANDA BYRK PHARMD AT 0715 ON [id number] BY SJW    Methicillin resistance DETECTED (A) NOT DETECTED Final    Comment: CRITICAL RESULT CALLED TO, READ BACK BY AND VERIFIED WITH: VERANDA BYRK PHARMD  AT 0715 ON [id number] BY SJW    Streptococcus species NOT DETECTED NOT DETECTED Final   Streptococcus agalactiae NOT DETECTED NOT DETECTED Final   Streptococcus pneumoniae NOT DETECTED NOT DETECTED Final   Streptococcus pyogenes NOT DETECTED NOT DETECTED Final   Acinetobacter baumannii NOT DETECTED NOT DETECTED Final   Enterobacteriaceae species NOT DETECTED NOT DETECTED Final   Enterobacter cloacae complex NOT DETECTED NOT DETECTED Final   Escherichia coli NOT DETECTED NOT DETECTED Final   Klebsiella oxytoca NOT DETECTED NOT DETECTED Final   Klebsiella pneumoniae NOT DETECTED NOT DETECTED Final   Proteus species NOT DETECTED NOT DETECTED Final   Serratia marcescens NOT DETECTED NOT DETECTED Final   Haemophilus influenzae NOT DETECTED NOT DETECTED Final   Neisseria meningitidis NOT DETECTED NOT DETECTED Final   Pseudomonas aeruginosa NOT DETECTED NOT DETECTED Final   Candida albicans NOT DETECTED NOT DETECTED Final   Candida glabrata NOT DETECTED NOT DETECTED Final   Candida krusei NOT DETECTED NOT DETECTED Final   Candida parapsilosis NOT DETECTED NOT DETECTED Final   Candida tropicalis NOT DETECTED NOT DETECTED Final    Comment: Performed at [facility], [place]. [address] [place], [place] [address]  Urine culture     Status: None   Collection Time: 04/19/17  5:01 PM  Result Value Ref Range Status   Specimen Description URINE, CATHETERIZED  Final   Special Requests Normal  Final   Culture   Final    NO GROWTH Performed at [facility], [address]  [place], [place] [address]    Report Status 04/20/2017 FINAL  Final  Blood Culture (routine x 2)  Status: Abnormal   Collection Time: 04/19/17  5:08 PM  Result Value Ref Range Status   Specimen Description BLOOD LEFT ANTECUBITAL  Final   Special Requests   Final    BOTTLES DRAWN AEROBIC AND ANAEROBIC Blood Culture adequate volume   Culture  Setup Time   Final    GRAM POSITIVE COCCI IN BOTH AEROBIC AND ANAEROBIC BOTTLES CRITICAL VALUE NOTED.  VALUE IS CONSISTENT WITH PREVIOUSLY REPORTED AND CALLED VALUE.    Culture (A)  Final    STAPHYLOCOCCUS AUREUS SUSCEPTIBILITIES PERFORMED ON PREVIOUS CULTURE WITHIN THE LAST 5 DAYS. Performed at [facility], [place] [address], [place], [place] [address]    Report Status 04/22/2017 FINAL  Final  Culture, blood (routine x 2)     Status: None (Preliminary result)   Collection Time: 04/21/17 12:58 PM  Result Value Ref Range Status   Specimen Description BLOOD RIGHT ANTECUBITAL  Final   Special Requests AEROBIC BOTTLE ONLY Blood Culture adequate volume  Final   Culture   Final    NO GROWTH 2 DAYS Performed at [facility], [place] [address] [place], [place] [address]    Report Status PENDING  Incomplete  Culture, blood (routine x 2)     Status: None (Preliminary result)   Collection Time: 04/21/17  1:00 PM  Result Value Ref Range Status   Specimen Description BLOOD RIGHT ANTECUBITAL  Final   Special Requests AEROBIC BOTTLE ONLY Blood Culture adequate volume  Final   Culture   Final    NO GROWTH 2 DAYS Performed at [facility], [place] [address], [place], [place] [address]    Report Status PENDING  Incomplete     Studies: Mr Lumbar Spine W Wo Contrast  Result Date: 04/22/2017 CLINICAL DATA:  Initial evaluation for low back pain, bacteremia. EXAM: MRI LUMBAR SPINE WITHOUT AND WITH CONTRAST TECHNIQUE: Multiplanar and multiecho pulse sequences of the lumbar spine were obtained without and with intravenous contrast. CONTRAST:  59mL  MULTIHANCE GADOBENATE DIMEGLUMINE 529 MG/ML IV SOLN COMPARISON:  None. FINDINGS: Segmentation:  Study moderately degraded by motion artifact. Normal segmentation. Lowest well-formed disc labeled the L5-S1 level. Alignment: 3 mm anterolisthesis of L3 on L4 and L4 on L5. Vertebral bodies otherwise normally aligned with preservation of the normal lumbar lordosis. Vertebrae: Vertebral body heights maintained without evidence for acute or chronic fracture. Bone marrow signal intensity within normal limits. No discrete or worrisome osseous lesions. No findings to suggest osteomyelitis discitis no significant inflammatory changes seen about the facets. Conus medullaris and cauda equina: Conus extends to the L2 level. Conus and cauda equina appear normal. Paraspinal and other soft tissues: No definite acute paraspinous soft tissue abnormality identified. Mildly elevated STIR signal intensity without associated enhancement within the posterior paraspinous musculature favored to be artifactual. T2 hyperintense cyst noted within the left kidney. Visualized visceral structures otherwise unremarkable. Disc levels: L1-2:  Unremarkable. L2-3: Mild disc desiccation with mild disc bulge. No significant canal or foraminal stenosis. L3-4: Mild anterolisthesis. Mild diffuse disc bulge with intervertebral disc space narrowing. Mild facet and ligament flavum hypertrophy. 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 Mild spinal stenosis. Very mild bilateral foraminal narrowing. L4-5: Anterolisthesis. Mild diffuse disc bulge with intervertebral disc space narrowing. Mild to moderate facet and ligament flavum hypertrophy. Resultant mild canal with bilateral lateral recess stenosis. Mild bilateral foraminal narrowing, slightly worse on the right. 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 L5-S1: Shallow posterior disc bulge with disc desiccation. Mild facet hypertrophy.  Possible subtle changes related to Baastrup's  disease. No significant stenosis. IMPRESSION: 1. No acute abnormality within the lumbar spine. No findings to suggest osteomyelitis/discitis or other intraspinal infection. 2. 3 mm anterolisthesis of L3 on L4 and L4 on L5 with resultant mild canal and bilateral foraminal stenosis. 3. 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 Electronically Signed   By: Jeannine Boga M.D.   On: 04/22/2017 21:30    Scheduled Meds: . diclofenac sodium  2 g Topical QID  . DULoxetine  30 mg Oral Daily  . enoxaparin (LOVENOX) injection  40 mg Subcutaneous Q24H  . flecainide  50 mg Oral BID  . ipratropium-albuterol  3 mL Nebulization BID  . mouth rinse  15 mL Mouth Rinse BID  . metoprolol succinate  25 mg Oral Daily  . pantoprazole  40 mg Oral Daily  . pravastatin  20 mg Oral Daily   Continuous Infusions: . vancomycin Stopped (04/23/17 1245)    Principal Problem:   Acute on chronic respiratory failure with hypoxia (HCC) Active Problems:   Essential hypertension   GERD (gastroesophageal reflux disease)   Depression   PAF (paroxysmal atrial fibrillation) (HCC)   CHF with right heart failure (HCC)   Sepsis (HCC)   Acute metabolic encephalopathy   Acute respiratory failure with hypoxia ([place])   Bacteremia due to Staphylococcus aureus   Hypoxemia    Time spent: >35 minutes     [facility] Pager [phone]. If 7PM-7AM, please contact night-coverage at [email or web address], password [facility] 04/23/2017, 2:16 PM  LOS: 4 days

## 2017-04-23 NOTE — Interval H&P Note (Signed)
History and Physical Interval Note: No interval changes. Will proceed with TEE to investigate source of bacteremia.  04/23/2017 8:35 AM  Gabrielle Miller  has presented today for surgery, with the diagnosis of BACTEREMIA  The various methods of treatment have been discussed with the patient and family. After consideration of risks, benefits and other options for treatment, the patient has consented to  Procedure(s): TRANSESOPHAGEAL ECHOCARDIOGRAM (TEE) (N/A) as a surgical intervention .  The patient's history has been reviewed, patient examined, no change in status, stable for surgery.  I have reviewed the patient's chart and labs.  Questions were answered to the patient's satisfaction.     Kate Sable

## 2017-04-24 ENCOUNTER — Encounter (HOSPITAL_COMMUNITY): Payer: Self-pay | Admitting: Cardiovascular Disease

## 2017-04-24 DIAGNOSIS — L02224 Furuncle of groin: Secondary | ICD-10-CM

## 2017-04-24 DIAGNOSIS — L03314 Cellulitis of groin: Secondary | ICD-10-CM

## 2017-04-24 DIAGNOSIS — K08109 Complete loss of teeth, unspecified cause, unspecified class: Secondary | ICD-10-CM

## 2017-04-24 LAB — BASIC METABOLIC PANEL
Anion gap: 10 (ref 5–15)
BUN: 6 mg/dL (ref 6–20)
CO2: 29 mmol/L (ref 22–32)
CREATININE: 0.68 mg/dL (ref 0.44–1.00)
Calcium: 8.6 mg/dL — ABNORMAL LOW (ref 8.9–10.3)
Chloride: 106 mmol/L (ref 101–111)
GFR calc non Af Amer: >60 mL/min (ref >60)
GLUCOSE: 112 mg/dL — AB (ref 65–99)
Potassium: 3.3 mmol/L — ABNORMAL LOW (ref 3.5–5.1)
Sodium: 145 mmol/L (ref 135–145)

## 2017-04-24 MED ORDER — POTASSIUM CHLORIDE 10 MEQ/100ML IV SOLN
10.0000 meq | INTRAVENOUS | Status: AC
  Administered 2017-04-24 (×2): 10 meq via INTRAVENOUS
  Filled 2017-04-24 (×2): qty 100

## 2017-04-24 MED ORDER — POTASSIUM CHLORIDE CRYS ER 20 MEQ PO TBCR
40.0000 meq | EXTENDED_RELEASE_TABLET | Freq: Once | ORAL | Status: AC
  Administered 2017-04-24: 40 meq via ORAL
  Filled 2017-04-24: qty 2

## 2017-04-24 MED ORDER — MAGNESIUM SULFATE 2 GM/50ML IV SOLN
2.0000 g | Freq: Once | INTRAVENOUS | Status: AC
  Administered 2017-04-24: 2 g via INTRAVENOUS
  Filled 2017-04-24: qty 50

## 2017-04-24 MED ORDER — FERROUS SULFATE 325 (65 FE) MG PO TABS
325.0000 mg | ORAL_TABLET | Freq: Every day | ORAL | Status: DC
  Administered 2017-04-25 – 2017-04-26 (×2): 325 mg via ORAL
  Filled 2017-04-24 (×2): qty 1

## 2017-04-24 MED ORDER — LIDOCAINE-EPINEPHRINE 2 %-1:100000 IJ SOLN
15.0000 mL | Freq: Once | INTRAMUSCULAR | Status: DC
  Filled 2017-04-24: qty 15

## 2017-04-24 MED ORDER — POTASSIUM CHLORIDE 20 MEQ/15ML (10%) PO SOLN
20.0000 meq | Freq: Once | ORAL | Status: DC
  Filled 2017-04-24: qty 15

## 2017-04-24 NOTE — Progress Notes (Signed)
[facility] for Infectious Disease  Date of Admission:  04/19/2017     Total days of antibiotics 5   Vancomycin Day 5         Patient ID: Gabrielle Miller is a 77 y.o. female with  Principal Problem:   Aortic valve endocarditis Active Problems:   Sepsis ([place])   MRSA bacteremia   Essential hypertension   GERD (gastroesophageal reflux disease)   Depression   PAF (paroxysmal atrial fibrillation) (HCC)   CHF with right heart failure (HCC)   Acute on chronic respiratory failure with hypoxia (HCC)   Acute metabolic encephalopathy   Acute respiratory failure with hypoxia (HCC)   Hypoxemia   . diclofenac sodium  2 g Topical QID  . DULoxetine  30 mg Oral Daily  . enoxaparin (LOVENOX) injection  40 mg Subcutaneous Q24H  . flecainide  50 mg Oral BID  . ipratropium-albuterol  3 mL Nebulization BID  . mouth rinse  15 mL Mouth Rinse BID  . metoprolol succinate  25 mg Oral Daily  . pantoprazole  40 mg Oral Daily  . pravastatin  20 mg Oral Daily    SUBJECTIVE: Feeling better today. Still slightly sore throat but chloraseptic spray helpful. She has a boil on her groin that is itching a fair bit. Tells me she has required surgery for this in the past. Some pain but "not as bad as it looks." Today [birth year] birthday.    Allergies  Allergen Reactions  . Penicillins Hives    Has patient had a PCN reaction causing immediate rash, facial/tongue/throat swelling, SOB or lightheadedness with hypotension: Yes Has patient had a PCN reaction causing severe rash involving mucus membranes or skin necrosis: No Has patient had a PCN reaction that required hospitalization: No Has patient had a PCN reaction occurring within the last 10 years: Yes If all of the above answers are "NO", then may proceed with Cephalosporin use.    Clementeen Hoof [Iodinated Diagnostic Agents] Nausea And Vomiting    OBJECTIVE: Vitals:   04/23/17 1008 04/23/17 1757 04/23/17 2019 04/24/17 0102  BP: (!) 176/110 (!)  176/101  (!) 165/106  Pulse:  88 91 (!) 115  Resp:   17   Temp:  98.4 F (36.9 C)  98.4 F (36.9 C)  TempSrc:  Oral  Oral  SpO2:   95% 94%  Weight:      Height:       Body mass index is 28.46 kg/m.  Physical Exam  Constitutional: She is oriented to person, place, and time. She appears well-developed and well-nourished.  Seated on bedside commode. Walked back to bed with some help. Smiling and laughing today.   HENT:  Mouth/Throat: Mucous membranes are normal. No oral lesions. Normal dentition. No dental abscesses. No oropharyngeal exudate.  edentulous   Eyes: Pupils are equal, round, and reactive to light. No scleral icterus.  Cardiovascular: Normal rate, regular rhythm, normal heart sounds and intact distal pulses.  No murmur heard. Pulmonary/Chest: Effort normal and breath sounds normal. No respiratory distress. She has no rales.  Abdominal: Soft. She exhibits no distension. There is no tenderness.  Genitourinary:  Genitourinary Comments: Tense, edematous furuncle on left external labia. Purulent appearing center but does not express easily.   Lymphadenopathy:    She has no cervical adenopathy.  Neurological: She is alert and oriented to person, place, and time.  Skin: Skin is warm and dry. No rash noted.  Psychiatric: She  has a normal mood and affect. Judgment normal.   Lab Results Lab Results  Component Value Date   WBC 11.2 (H) 04/22/2017   HGB 8.8 (L) 04/22/2017   HCT 28.3 (L) 04/22/2017   MCV 80.9 04/22/2017   PLT 379 04/22/2017    Lab Results  Component Value Date   CREATININE 0.68 04/24/2017   BUN 6 04/24/2017   NA 145 04/24/2017   K 3.3 (L) 04/24/2017   CL 106 04/24/2017   CO2 29 04/24/2017    Lab Results  Component Value Date   ALT 10 (L) 04/19/2017   AST 23 04/19/2017   ALKPHOS 106 04/19/2017   BILITOT 1.1 04/19/2017     Microbiology: BCx 4/19 >> MRSA 2/2 BCx 4/21 >> NG @ 72h   ASSESSMENT: 77 y.o. F with MRSA bacteremia in setting of AV  endocarditis. She will need PICC line placement in the next 24h once her blood cultures have cleared. She has a boil on her left labia with some surrounding cellulitis causing her some discomfort - surgery consult for drainage is being arranged. Likely this is staph and will be well treated with Vancomycin she is getting presently paired with surgical drainage. Throat is feeling better.   PLAN: 1. Continue vancomycin  2. Monitor renal function  3. Will need 6 weeks of therapy through June 2 4. Hold for PICC until tomorrow please  5. Agree with drainage of labial boil.       [facility] Staphylococcus aureus bacteremia   Staphylococcus aureus bacteremia (SAB) is associated with a high rate of complications and mortality.  Specific aspects of clinical management are critical to optimizing the outcome of patients with SAB.  Therefore, the [facility] Antimicrobial Management Team [facility]) has initiated an intervention aimed at improving the management of SAB at [facility].  To do so, Infectious Diseases physicians are providing an evidence-based consult for the management of all patients with SAB.     Yes No Comments  Perform follow-up blood cultures (even if the patient is afebrile) to ensure clearance of bacteremia [x]  []  Repeat BCx NG at 72h  Remove vascular catheter and obtain follow-up blood cultures after the removal of the catheter [x]  []  No line on admit - please HOLD on PICC for now   Perform echocardiography to evaluate for endocarditis (transthoracic ECHO is 40-50% sensitive, TEE is > 90% sensitive) [x]  []  TEE 4/23 with AoV calcification and likely vegetation => will need 6 weeks IV   Consult electrophysiologist to evaluate implanted cardiac device (pacemaker, ICD) []  [x]  N/A   Ensure source control [x]  []  No surgery indicated for endocarditis - functionally no AI   Investigate for "metastatic" sites of infection [x]  []  No further fevers and ROS/exam negative  for other metastatic sites. Endocarditis identified..  Change antibiotic therapy to VANCOMYCIN  [x]  []  Beta-lactam antibiotics are preferred for MSSA due to higher cure rates.   If on Vancomycin, goal trough should be 15 - 20 mcg/mL  Estimated duration of IV antibiotic therapy:   [x]  []  6 weeks duration - discussed with Pam with [facility] and notified about case.     Janene Madeira, MSN, NP-C [facility] for Infectious [place] Cell: [phone] Pager: [phone]  04/24/2017  8:48 AM

## 2017-04-24 NOTE — Progress Notes (Signed)
PROGRESS NOTE  Gabrielle Miller GLO:[id number] DOB: 07-14-1940 DOA: 04/19/2017 PCP: Ocie Doyne., MD  HPI/Recap of past 49 hours: 77 year old female brought to the hospital with shortness of breath, fever, chills and altered mental status. Initial concern for possible pulmonary infection, but chest x-ray does not show any clear pneumonia. She is found to have 2 out of 2 positive sets of blood cultures for staph aureus. Currently on intravenous vancomycin.  Patient underwent TEE today, 04/23/2017 and the finding is stated to be worrisome for possible Aortic valve vegetation.  04/24/17: Patient is seen and examined at her bedside. RN reports boil in labia. Paged General surgery on call for possible debridement. Also reports odynophagia.  ID following highly appreciated.   Assessment/Plan: Principal Problem:   Aortic valve endocarditis Active Problems:   Essential hypertension   GERD (gastroesophageal reflux disease)   Depression   PAF (paroxysmal atrial fibrillation) (HCC)   CHF with right heart failure (HCC)   Acute on chronic respiratory failure with hypoxia (HCC)   Sepsis (HCC)   Acute metabolic encephalopathy   Acute respiratory failure with hypoxia (HCC)   MRSA bacteremia   Hypoxemia  MRSA bacteremia complicated by aortic valve vegetation ID consulted and following Continue IV vancomycin Continue close monitor on telemetry Repeated blood cultures x2 done on 04/21/2017- with no growth to date Repeat CBC in the morning  Left labial boil Self-reported previous boil that required debridement General surgery paged for consult for possible debridement Continue IV vancomycin  Hypokalemia Potassium 3.3 Repleted with oral potassium and IV magnesium 2 g once Repeat BMP in the morning  Iron deficiency anemia Low trans sat and low iron Start ferrous sulfate supplement daily  Code Status: Full code  Family Communication: None at bedside  Disposition Plan: Will stay another  midnight to continue IV antibiotics.   Consultants:  Infectious disease  General surgery  Procedures:  TEE  Antimicrobials:  IV vancomycin  DVT prophylaxis: Subcu Lovenox   Objective: Vitals:   04/23/17 2019 04/24/17 0102 04/24/17 0907 04/24/17 0911  BP:  (!) 165/106  (!) 199/130  Pulse: 91 (!) 115 (!) 107 96  Resp: 17  18 (!) 21  Temp:  98.4 F (36.9 C)    TempSrc:  Oral    SpO2: 95% 94% 97% 100%  Weight:      Height:        Intake/Output Summary (Last 24 hours) at 04/24/2017 0930 Last data filed at 04/24/2017 0650 Gross per 24 hour  Intake 600 ml  Output 500 ml  Net 100 ml   Filed Weights   04/19/17 1704 04/22/17 0619 04/23/17 0829  Weight: 72.6 kg (160 lb) 77.8 kg (171 lb 8.3 oz) 77.6 kg (171 lb)    Exam:   General: 77 year old African-American female well-developed well-nourished mildly uncomfortable due to odynophagia.  Cardiovascular: Regular rate and rhythm with no rubs or gallops.  No JVD or thyromegaly.  Respiratory: Clear to auscultation with no wheezes or rales.  Abdomen: Soft nontender nondistended with normal bowel sounds x4  Musculoskeletal: Scars on knees bilaterally.  Trace edema in the lower extremities  Skin: As stated above  Psychiatry: Mood is appropriate for condition and setting   Data Reviewed: CBC: Recent Labs  Lab 04/19/17 1655 04/21/17 0308 04/22/17 0647  WBC 14.9* 13.1* 11.2*  NEUTROABS 12.4*  --   --   HGB 10.1* 8.1* 8.8*  HCT 32.6* 26.4* 28.3*  MCV 82.1 81.2 80.9  PLT 417* 353 379   Basic  Metabolic Panel: Recent Labs  Lab 04/19/17 1655 04/21/17 0308 04/21/17 0800 04/22/17 0248 04/24/17 0426  NA 135 136  --  134* 145  K 3.7 3.4*  --  3.6 3.3*  CL 97* 103  --  105 106  CO2 24 24  --  22 29  GLUCOSE 92 103*  --  107* 112*  BUN 10 13  --  9 6  CREATININE 0.84 0.83  --  0.71 0.68  CALCIUM 8.5* 8.2*  --  8.2* 8.6*  MG  --   --  1.8  --   --    GFR: Estimated Creatinine Clearance: 59.7 mL/min (by C-G  formula based on SCr of 0.68 mg/dL). Liver Function Tests: Recent Labs  Lab 04/19/17 1655  AST 23  ALT 10*  ALKPHOS 106  BILITOT 1.1  PROT 7.7  ALBUMIN 3.1*   No results for input(s): LIPASE, AMYLASE in the last 168 hours. Recent Labs  Lab 04/19/17 2020  AMMONIA 24   Coagulation Profile: No results for input(s): INR, PROTIME in the last 168 hours. Cardiac Enzymes: Recent Labs  Lab 04/19/17 1655  TROPONINI <0.03   BNP (last 3 results) No results for input(s): PROBNP in the last 8760 hours. HbA1C: No results for input(s): HGBA1C in the last 72 hours. CBG: Recent Labs  Lab 04/21/17 1219 04/21/17 1626 04/21/17 2102 04/22/17 0752 04/22/17 1216  GLUCAP 123* 114* 159* 106* 87   Lipid Profile: No results for input(s): CHOL, HDL, LDLCALC, TRIG, CHOLHDL, LDLDIRECT in the last 72 hours. Thyroid Function Tests: No results for input(s): TSH, T4TOTAL, FREET4, T3FREE, THYROIDAB in the last 72 hours. Anemia Panel: Recent Labs    04/22/17 0248 04/22/17 0647  VITAMINB12 280  --   FOLATE 13.3  --   FERRITIN 131  --   TIBC 211*  --   IRON 11*  --   RETICCTPCT  --  1.3   Urine analysis:    Component Value Date/Time   COLORURINE STRAW (A) 04/19/2017 1729   APPEARANCEUR CLEAR 04/19/2017 1729   LABSPEC 1.008 04/19/2017 1729   PHURINE 6.0 04/19/2017 1729   GLUCOSEU NEGATIVE 04/19/2017 1729   HGBUR SMALL (A) 04/19/2017 1729   BILIRUBINUR NEGATIVE 04/19/2017 1729   KETONESUR NEGATIVE 04/19/2017 1729   PROTEINUR NEGATIVE 04/19/2017 1729   NITRITE NEGATIVE 04/19/2017 1729   LEUKOCYTESUR NEGATIVE 04/19/2017 1729   Sepsis Labs: @LABRCNTIP (procalcitonin:4,lacticidven:4)  ) Recent Results (from the past 240 hour(s))  Blood Culture (routine x 2)     Status: Abnormal   Collection Time: 04/19/17  4:49 PM  Result Value Ref Range Status   Specimen Description BLOOD RIGHT ANTECUBITAL  Final   Special Requests   Final    BOTTLES DRAWN AEROBIC AND ANAEROBIC Blood Culture  adequate volume   Culture  Setup Time   Final    GRAM POSITIVE COCCI IN BOTH AEROBIC AND ANAEROBIC BOTTLES CRITICAL RESULT CALLED TO, READ BACK BY AND VERIFIED WITH: VERANDA Bithlo PHARMD AT [place] [id number] BY SJW Performed at [facility], [place] [address], [place], [place] [address]    Culture METHICILLIN RESISTANT STAPHYLOCOCCUS AUREUS (A)  Final   Report Status 04/22/2017 FINAL  Final   Organism ID, Bacteria METHICILLIN RESISTANT STAPHYLOCOCCUS AUREUS  Final      Susceptibility   Methicillin resistant staphylococcus aureus - MIC*    CIPROFLOXACIN >=8 RESISTANT Resistant     ERYTHROMYCIN >=8 RESISTANT Resistant     GENTAMICIN <=0.5 SENSITIVE Sensitive     OXACILLIN >=4  RESISTANT Resistant     TETRACYCLINE <=1 SENSITIVE Sensitive     VANCOMYCIN <=0.5 SENSITIVE Sensitive     TRIMETH/SULFA <=10 SENSITIVE Sensitive     CLINDAMYCIN <=0.25 SENSITIVE Sensitive     RIFAMPIN <=0.5 SENSITIVE Sensitive     Inducible Clindamycin NEGATIVE Sensitive     * METHICILLIN RESISTANT STAPHYLOCOCCUS AUREUS  Blood Culture ID Panel (Reflexed)     Status: Abnormal   Collection Time: 04/19/17  4:49 PM  Result Value Ref Range Status   Enterococcus species NOT DETECTED NOT DETECTED Final   Listeria monocytogenes NOT DETECTED NOT DETECTED Final   Staphylococcus species DETECTED (A) NOT DETECTED Final    Comment: CRITICAL RESULT CALLED TO, READ BACK BY AND VERIFIED WITH: VERANDA BYRK PHARMD AT 0715 ON [id number] BY SJW    Staphylococcus aureus DETECTED (A) NOT DETECTED Final    Comment: Methicillin (oxacillin)-resistant Staphylococcus aureus (MRSA). MRSA is predictably resistant to beta-lactam antibiotics (except ceftaroline). Preferred therapy is vancomycin unless clinically contraindicated. Patient requires contact precautions if  hospitalized. CRITICAL RESULT CALLED TO, READ BACK BY AND VERIFIED WITH: VERANDA BYRK PHARMD AT 0715 ON [id number] BY SJW    Methicillin resistance DETECTED (A) NOT DETECTED Final     Comment: CRITICAL RESULT CALLED TO, READ BACK BY AND VERIFIED WITH: VERANDA BYRK PHARMD  AT 0715 ON [id number] BY SJW    Streptococcus species NOT DETECTED NOT DETECTED Final   Streptococcus agalactiae NOT DETECTED NOT DETECTED Final   Streptococcus pneumoniae NOT DETECTED NOT DETECTED Final   Streptococcus pyogenes NOT DETECTED NOT DETECTED Final   Acinetobacter baumannii NOT DETECTED NOT DETECTED Final   Enterobacteriaceae species NOT DETECTED NOT DETECTED Final   Enterobacter cloacae complex NOT DETECTED NOT DETECTED Final   Escherichia coli NOT DETECTED NOT DETECTED Final   Klebsiella oxytoca NOT DETECTED NOT DETECTED Final   Klebsiella pneumoniae NOT DETECTED NOT DETECTED Final   Proteus species NOT DETECTED NOT DETECTED Final   Serratia marcescens NOT DETECTED NOT DETECTED Final   Haemophilus influenzae NOT DETECTED NOT DETECTED Final   Neisseria meningitidis NOT DETECTED NOT DETECTED Final   Pseudomonas aeruginosa NOT DETECTED NOT DETECTED Final   Candida albicans NOT DETECTED NOT DETECTED Final   Candida glabrata NOT DETECTED NOT DETECTED Final   Candida krusei NOT DETECTED NOT DETECTED Final   Candida parapsilosis NOT DETECTED NOT DETECTED Final   Candida tropicalis NOT DETECTED NOT DETECTED Final    Comment: Performed at [facility], [place]. [address], [place], [place] [address]  Urine culture     Status: None   Collection Time: 04/19/17  5:01 PM  Result Value Ref Range Status   Specimen Description URINE, CATHETERIZED  Final   Special Requests Normal  Final   Culture   Final    NO GROWTH Performed at [facility], [address] [place],  [address]    Report Status 04/20/2017 FINAL  Final  Blood Culture (routine x 2)     Status: Abnormal   Collection Time: 04/19/17  5:08 PM  Result Value Ref Range Status   Specimen Description BLOOD LEFT ANTECUBITAL  Final   Special Requests   Final    BOTTLES DRAWN AEROBIC AND ANAEROBIC Blood Culture adequate volume    Culture  Setup Time   Final    GRAM POSITIVE COCCI IN BOTH AEROBIC AND ANAEROBIC BOTTLES CRITICAL VALUE NOTED.  VALUE IS CONSISTENT WITH PREVIOUSLY REPORTED AND CALLED VALUE.    Culture (A)  Final  STAPHYLOCOCCUS AUREUS SUSCEPTIBILITIES PERFORMED ON PREVIOUS CULTURE WITHIN THE LAST 5 DAYS. Performed at [facility], [place] [address] [place], [place] [address]    Report Status 04/22/2017 FINAL  Final  Culture, blood (routine x 2)     Status: None (Preliminary result)   Collection Time: 04/21/17 12:58 PM  Result Value Ref Range Status   Specimen Description BLOOD RIGHT ANTECUBITAL  Final   Special Requests AEROBIC BOTTLE ONLY Blood Culture adequate volume  Final   Culture   Final    NO GROWTH 2 DAYS Performed at [facility], [place] [address], [place], [place] [address]    Report Status PENDING  Incomplete  Culture, blood (routine x 2)     Status: None (Preliminary result)   Collection Time: 04/21/17  1:00 PM  Result Value Ref Range Status   Specimen Description BLOOD RIGHT ANTECUBITAL  Final   Special Requests AEROBIC BOTTLE ONLY Blood Culture adequate volume  Final   Culture   Final    NO GROWTH 2 DAYS Performed at [facility], [place] [address] [place], [place] [address]    Report Status PENDING  Incomplete      Studies: No results found.  Scheduled Meds: . diclofenac sodium  2 g Topical QID  . DULoxetine  30 mg Oral Daily  . enoxaparin (LOVENOX) injection  40 mg Subcutaneous Q24H  . flecainide  50 mg Oral BID  . ipratropium-albuterol  3 mL Nebulization BID  . mouth rinse  15 mL Mouth Rinse BID  . metoprolol succinate  25 mg Oral Daily  . pantoprazole  40 mg Oral Daily  . pravastatin  20 mg Oral Daily    Continuous Infusions: . potassium chloride 10 mEq (04/24/17 0916)  . vancomycin Stopped (04/23/17 2300)     LOS: 5 days     Kayleen Memos, MD [facility] Pager [phone]  If 7PM-7AM, please contact  night-coverage [email or web address] Password [email or web address] 04/24/2017, 9:30 AM

## 2017-04-24 NOTE — Care Management Note (Addendum)
Case Management Note  Patient Details  Name: Gabrielle Miller MRN: [id number] Date of Birth: 02/09/1940  Subjective/Objective:  From home alone, presents with acute /chronic resp failure, chf, stap bacteremia, afib, back pain, febrile, she is s/p TEE yesterday which shows aortic valve endocarditis, ID following, she will need 6 weeks of iv vanc thru 6/2, she will get picc tomorrow.  She chose [facility] form the [employer], referral made to [facility] for [facility] for IV ABX.  Patient states her sister [name] and her brother [name] will be able to assist with the iv abx.                 Action/Plan: DC home with HHRN with IV ABX when ready.  Expected Discharge Date:  04/23/17               Expected Discharge Plan:  [place]  In-House Referral:     Discharge planning Services  CM Consult  Post Acute Care Choice:  Home Health Choice offered to:  Patient  DME Arranged:    DME Agency:  NA  HH Arranged:  RN, iv abx [place] Agency:  [facility]  Status of Service:  Completed, signed off  If discussed at [place] of Stay Meetings, dates discussed:    Additional Comments:  Zenon Mayo, RN 04/24/2017, 10:09 AM

## 2017-04-24 NOTE — Consult Note (Addendum)
[facility] Surgery Consult Note  Gabrielle Miller July 15, 1940  [id number].    Requesting MD: Nevada Crane, MD Chief Complaint/Reason for Consult: labial abscess  HPI:  77 y/o F with MMP who presented to [facility] with a cc fever, SOB, AMS. Patient found to have staph bacteremia and admitted to the hospital 04/19/17. TEE yesterday significant for aortic valve vegetation suggesting endocarditis and ID is following. Today the patient c/o of a boil on her labia so general surgery was asked to consult.  The patient is very nice and reports she noticed a "bump" on her left labia about 10 days ago. She states that it is non-painful and itchy. Denies drainage from the area. Denies a history of abscess in the same location. Does report having an abscess or cyst drained from her right groin in the past. The patient denies urinary sxs or changes in bowel habits. Allergic to PCN.   ROS: Review of Systems  Constitutional: Positive for chills and fever.  HENT: Positive for sore throat.   Respiratory: Positive for shortness of breath.   Cardiovascular: Negative.   Gastrointestinal: Negative for abdominal pain.  Genitourinary: Negative.   All other systems reviewed and are negative.   History reviewed. No pertinent family history.  Past Medical History:  Diagnosis Date  . CHF with right heart failure ([place])   . Depression   . Essential hypertension   . GERD (gastroesophageal reflux disease)   . PAF (paroxysmal atrial fibrillation) ([place])     Past Surgical History:  Procedure Laterality Date  . HERNIA REPAIR    . TEE WITHOUT CARDIOVERSION N/A 04/23/2017   Procedure: TRANSESOPHAGEAL ECHOCARDIOGRAM (TEE);  Surgeon: Herminio Commons, MD;  Location: [facility] ENDOSCOPY;  Service: Cardiovascular;  Laterality: N/A;    Social History:  reports that she has quit smoking. She has never used smokeless tobacco. She reports that she drank alcohol. She reports that she has current or past drug history.  Allergies:   Allergies  Allergen Reactions  . Penicillins Hives    Has patient had a PCN reaction causing immediate rash, facial/tongue/throat swelling, SOB or lightheadedness with hypotension: Yes Has patient had a PCN reaction causing severe rash involving mucus membranes or skin necrosis: No Has patient had a PCN reaction that required hospitalization: No Has patient had a PCN reaction occurring within the last 10 years: Yes If all of the above answers are "NO", then may proceed with Cephalosporin use.    Samuel [place] Dye [Iodinated Diagnostic Agents] Nausea And Vomiting    Medications Prior to Admission  Medication Sig Dispense Refill  . acetaminophen (TYLENOL) 500 MG tablet Take 1,000 mg by mouth every 6 (six) hours as needed (for arthritis pain).    . DULoxetine (CYMBALTA) 30 MG capsule Take 30 mg by mouth daily.  3  . flecainide (TAMBOCOR) 50 MG tablet Take 50 mg by mouth 2 (two) times daily.    . fluticasone-salmeterol (ADVAIR HFA) 115-21 MCG/ACT inhaler Inhale 2 puffs into the lungs 2 (two) times daily as needed (for flares).     . furosemide (LASIX) 40 MG tablet Take 40 mg by mouth daily.    . metoprolol succinate (TOPROL-XL) 50 MG 24 hr tablet Take 50 mg by mouth daily. Take with or immediately following a meal.    . omeprazole (PRILOSEC) 20 MG capsule Take 20 mg by mouth daily before breakfast.    . oxyCODONE-acetaminophen (PERCOCET) 10-325 MG tablet Take 1 tablet by mouth every 6 (six) hours as needed for pain.    Marland Kitchen  potassium chloride (K-DUR,KLOR-CON) 10 MEQ tablet Take 10 mEq by mouth daily.    . pravastatin (PRAVACHOL) 20 MG tablet Take 20 mg by mouth daily.    Marland Kitchen UNABLE TO FIND Unnamed pain cream for rheumatoid arthritis: Apply to painful sites two to three times a day as needed for pain      Blood pressure (!) 199/130, pulse 96, temperature 98.4 F (36.9 C), temperature source Oral, resp. rate (!) 21, height '5\' 5"'$  (1.651 m), weight 77.6 kg (171 lb), SpO2 100 %. Physical Exam: Physical  Exam  Constitutional: She is oriented to person, place, and time. She appears well-developed and well-nourished. No distress.  HENT:  Head: Normocephalic and atraumatic.  Right Ear: External ear normal.  Left Ear: External ear normal.  Nose: Nose normal.  Eyes: Pupils are equal, round, and reactive to light. EOM are normal. No scleral icterus.  Neck: Normal range of motion. No tracheal deviation present. No thyromegaly present.  Cardiovascular: Normal rate, regular rhythm and intact distal pulses. Exam reveals no gallop and no friction rub.  Pulmonary/Chest: Effort normal and breath sounds normal. No stridor. No respiratory distress. She exhibits no tenderness.  Abdominal: Soft. Bowel sounds are normal. She exhibits no distension. There is no tenderness. There is no rebound.  Genitourinary:  Genitourinary Comments: Roughly 2 cm, fluctuant, abscess on left labia majora with about 6 cm of proximal induration. No active drainage. Minimally tender.  Musculoskeletal: Normal range of motion. She exhibits no tenderness or deformity.  Neurological: She is alert and oriented to person, place, and time. No sensory deficit.  Skin: Skin is warm and dry. No rash noted. She is not diaphoretic.  Psychiatric: She has a normal mood and affect. Her behavior is normal. Thought content normal.    Results for orders placed or performed during the hospital encounter of 04/19/17 (from the past 48 hour(s))  Basic metabolic panel     Status: Abnormal   Collection Time: 04/24/17  4:26 AM  Result Value Ref Range   Sodium 145 135 - 145 mmol/L   Potassium 3.3 (L) 3.5 - 5.1 mmol/L   Chloride 106 101 - 111 mmol/L   CO2 29 22 - 32 mmol/L   Glucose, Bld 112 (H) 65 - 99 mg/dL   BUN 6 6 - 20 mg/dL   Creatinine, Ser 0.68 0.44 - 1.00 mg/dL   Calcium 8.6 (L) 8.9 - 10.3 mg/dL   GFR calc non Af Amer >60 >60 mL/min   GFR calc Af Amer >60 >60 mL/min    Comment: (NOTE) The eGFR has been calculated using the CKD EPI  equation. This calculation has not been validated in all clinical situations. eGFR's persistently <60 mL/min signify possible Chronic Kidney Disease.    Anion gap 10 5 - 15    Comment: Performed at [facility] [address], [place], [place] [address]   Mr Lumbar Spine W Wo Contrast  Result Date: 04/22/2017 CLINICAL DATA:  Initial evaluation for low back pain, bacteremia. EXAM: MRI LUMBAR SPINE WITHOUT AND WITH CONTRAST TECHNIQUE: Multiplanar and multiecho pulse sequences of the lumbar spine were obtained without and with intravenous contrast. CONTRAST:  57m MULTIHANCE GADOBENATE DIMEGLUMINE 529 MG/ML IV SOLN COMPARISON:  None. FINDINGS: Segmentation:  Study moderately degraded by motion artifact. Normal segmentation. Lowest well-formed disc labeled the L5-S1 level. Alignment: 3 mm anterolisthesis of L3 on L4 and L4 on L5. Vertebral bodies otherwise normally aligned with preservation of the normal lumbar lordosis. Vertebrae: Vertebral body heights maintained  without evidence for acute or chronic fracture. Bone marrow signal intensity within normal limits. No discrete or worrisome osseous lesions. No findings to suggest osteomyelitis discitis no significant inflammatory changes seen about the facets. Conus medullaris and cauda equina: Conus extends to the L2 level. Conus and cauda equina appear normal. Paraspinal and other soft tissues: No definite acute paraspinous soft tissue abnormality identified. Mildly elevated STIR signal intensity without associated enhancement within the posterior paraspinous musculature favored to be artifactual. T2 hyperintense cyst noted within the left kidney. Visualized visceral structures otherwise unremarkable. Disc levels: L1-2:  Unremarkable. L2-3: Mild disc desiccation with mild disc bulge. No significant canal or foraminal stenosis. L3-4: Mild anterolisthesis. Mild diffuse disc bulge with intervertebral disc space narrowing. Mild facet and ligament flavum  hypertrophy. 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 Mild spinal stenosis. Very mild bilateral foraminal narrowing. L4-5: Anterolisthesis. Mild diffuse disc bulge with intervertebral disc space narrowing. Mild to moderate facet and ligament flavum hypertrophy. Resultant mild canal with bilateral lateral recess stenosis. Mild bilateral foraminal narrowing, slightly worse on the right. 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 L5-S1: Shallow posterior disc bulge with disc desiccation. Mild facet hypertrophy. 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 No significant stenosis. IMPRESSION: 1. No acute abnormality within the lumbar spine. No findings to suggest osteomyelitis/discitis or other intraspinal infection. 2. 3 mm anterolisthesis of L3 on L4 and L4 on L5 with resultant mild canal and bilateral foraminal stenosis. 3. 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 Electronically Signed   By: Jeannine Boga M.D.   On: 04/22/2017 21:30      Assessment/Plan Aortic valve endocarditis MRSA bacteremia on vacomycin per ID  HTN CHF PAF  Acute on chronic respiratory failure  GERD  Depression  Left, vulvar abscess - the abscess is minimally tender and wound benefit from I&D. I think it is reasonable to attempt bedside incision and drainage to avoid risk of general anesthesia.  - I will take aerobic and anaerobic cultures, though suspect may not grow anything as pt is already on abx.  - will require wet-to-dry dressing changes at home  - tentatively plan for follow up in our [facility] office in one week with Carlena Hurl, PA-C for a wound check.    FEN: heart healthy ID: Vancomycin 4/19 >> VTE: SCD's, Lovenox  Foley: none   Jill Alexanders, [facility] Surgery 04/24/2017, 12:41 PM Pager: [phone] Consults: [phone] Mon-Fri 7:00 am-4:30 pm Sat-Sun 7:00 am-11:30 am

## 2017-04-24 NOTE — Procedures (Addendum)
Incision and Drainage Procedure Note  Pre-operative Diagnosis: vulvar abscess  Post-operative Diagnosis: same  Indications: enlarging vulvar abscess in setting of MRSA bacteremia   Anesthesia: 2% lidocaine with epinephrine  Procedure Details  The procedure, risks and complications have been discussed in detail with the patient, and the patient has signed consent to the procedure.  The skin was sterilely prepped and draped over the affected area in the usual fashion. After adequate local anesthesia, I&D with a #11 blade was performed on the left labia majora. A 3 cm elipse incision was made and purulence was encountered, loculations were broken up with a hemostat. The wound was found to have a narrow tunnel that tracked roughly 7 cm posteriorly and superiorly.  Purulent drainage: present. A wet-to-dry dressing was placed using a 4x4 gauze moistened with normal saline, packed in the wound with long cotton swab applicator, covered with dry dressing and mesh underwear.   Findings: Abscess with tunnel   EBL:  Minimal   Drains: None  Condition: Tolerated procedure well   Complications: none.  Plan: -anaerobic and aerobic Cx taken -continue IV abx per ID - wash area daily with mild soap and warm water  - daily dressing changes, change more frequently for contamination with urine or stool; CCS PA-C will do first dressing change tomorrow and switch to 1/4" iodoform packing. - will need HH orders for wound care and sister can assist with wound care at home.   Obie Dredge, PA-C [facility] Pager: [phone] Consults: [phone] Mon-Fri 7:00 am-4:30 pm Sat-Sun 7:00 am-11:30 am

## 2017-04-24 NOTE — Progress Notes (Signed)
Pharmacy Antibiotic Note  Gabrielle Miller is a 77 y.o. female admitted on 04/19/2017 with acute on chronic respiratory failure, started on Levaquin for suspected bronchitis, now w/ bacteremia w/ MRSA.  Pharmacy has been consulted for vancomycin dosing.  Continues on abx for MRSA bacteremia. Afebrile, WBC 11.2. Repeat cx's pending. TEE showed vegetation on valve.   Plan: Continue Vancomycin 750 mg q12h Monitor clinical picture, renal function, VT prn F/U TEE, C&S, abx deescalation / LOT  Tentative planned course 6 weeks  Height: 5\' 5"  (165.1 cm) Weight: 171 lb (77.6 kg) IBW/kg (Calculated) : 57  Temp (24hrs), Avg:98.4 F (36.9 C), Min:98.4 F (36.9 C), Max:98.5 F (36.9 C)  Recent Labs  Lab 04/19/17 1655 04/19/17 1708 04/19/17 1855 04/20/17 0035 04/20/17 0316 04/21/17 0308 04/21/17 1952 04/22/17 0248 04/22/17 0647 04/24/17 0426  WBC 14.9*  --   --   --   --  13.1*  --   --  11.2*  --   CREATININE 0.84  --   --   --   --  0.83  --  0.71  --  0.68  LATICACIDVEN  --  1.79 0.99 1.7 2.3*  --   --   --   --   --   VANCOTROUGH  --   --   --   --   --   --  17  --   --   --     Estimated Creatinine Clearance: 59.7 mL/min (by C-G formula based on SCr of 0.68 mg/dL).    Allergies  Allergen Reactions  . Penicillins Hives    Has patient had a PCN reaction causing immediate rash, facial/tongue/throat swelling, SOB or lightheadedness with hypotension: Yes Has patient had a PCN reaction causing severe rash involving mucus membranes or skin necrosis: No Has patient had a PCN reaction that required hospitalization: No Has patient had a PCN reaction occurring within the last 10 years: Yes If all of the above answers are "NO", then may proceed with Cephalosporin use.    Clementeen Hoof [Iodinated Diagnostic Agents] Nausea And Vomiting     Thank you for allowing pharmacy to be a part of this patient's care.  Elenor Quinones, PharmD, BCPS Clinical Pharmacist Pager [phone] 04/24/2017  8:50 AM

## 2017-04-25 ENCOUNTER — Inpatient Hospital Stay: Payer: Self-pay

## 2017-04-25 LAB — BASIC METABOLIC PANEL
ANION GAP: 9 (ref 5–15)
BUN: 9 mg/dL (ref 6–20)
CALCIUM: 8.7 mg/dL — AB (ref 8.9–10.3)
CO2: 23 mmol/L (ref 22–32)
Chloride: 103 mmol/L (ref 101–111)
Creatinine, Ser: 0.7 mg/dL (ref 0.44–1.00)
GFR calc non Af Amer: >60 mL/min (ref >60)
GLUCOSE: 107 mg/dL — AB (ref 65–99)
Potassium: 3.5 mmol/L (ref 3.5–5.1)
Sodium: 135 mmol/L (ref 135–145)

## 2017-04-25 LAB — CBC
HEMATOCRIT: 29.3 % — AB (ref 36.0–46.0)
HEMOGLOBIN: 9.2 g/dL — AB (ref 12.0–15.0)
MCH: 24.9 pg — ABNORMAL LOW (ref 26.0–34.0)
MCHC: 31.4 g/dL (ref 30.0–36.0)
MCV: 79.2 fL (ref 78.0–100.0)
PLATELETS: 493 10*3/uL — AB (ref 150–400)
RBC: 3.7 MIL/uL — AB (ref 3.87–5.11)
RDW: 17.7 % — ABNORMAL HIGH (ref 11.5–15.5)
WBC: 13.6 10*3/uL — AB (ref 4.0–10.5)

## 2017-04-25 MED ORDER — METOPROLOL SUCCINATE ER 50 MG PO TB24
50.0000 mg | ORAL_TABLET | Freq: Every day | ORAL | Status: DC
  Administered 2017-04-26: 50 mg via ORAL
  Filled 2017-04-25: qty 1

## 2017-04-25 MED ORDER — SODIUM CHLORIDE 0.9% FLUSH: 10.0000 mL | INTRAVENOUS | Status: DC | PRN

## 2017-04-25 MED ORDER — FUROSEMIDE 40 MG PO TABS
40.0000 mg | ORAL_TABLET | Freq: Every day | ORAL | Status: DC
  Administered 2017-04-25 – 2017-04-26 (×2): 40 mg via ORAL
  Filled 2017-04-25 (×2): qty 1

## 2017-04-25 MED ORDER — SODIUM CHLORIDE 0.9 % IV BOLUS
250.0000 mL | Freq: Once | INTRAVENOUS | Status: AC
  Administered 2017-04-25: 250 mL via INTRAVENOUS

## 2017-04-25 NOTE — Progress Notes (Signed)
2 Days Post-Op   Subjective/Chief Complaint: Denies pain, nursing reports that she was itching at the area overnight.   Objective: Vital signs in last 24 hours: Temp:  [98.4 F (36.9 C)] 98.4 F (36.9 C) (04/25 0008) Pulse Rate:  [92-107] 92 (04/25 0008) Resp:  [18-24] 20 (04/25 0008) BP: (137-199)/(96-130) 137/96 (04/25 0008) SpO2:  [96 %-100 %] 96 % (04/25 0008) Last BM Date: 04/23/17  Intake/Output from previous day: 04/24 0701 - 04/25 0700 In: 566.2 [IV Piggyback:566.2] Out: 220 [Urine:220] Intake/Output this shift: No intake/output data recorded.  General appearance: alert, cooperative and a little bit confused Resp: unlabored respirations GI: soft, non-tender; bowel sounds normal; no masses,  no organomegaly Incision/Wound: minimal tenderness to left labia, minimal erythema, some residual induration.  Lab Results:  Recent Labs    04/25/17 0521  WBC 13.6*  HGB 9.2*  HCT 29.3*  PLT 493*   BMET Recent Labs    04/24/17 0426 04/25/17 0521  NA 145 135  K 3.3* 3.5  CL 106 103  CO2 29 23  GLUCOSE 112* 107*  BUN 6 9  CREATININE 0.68 0.70  CALCIUM 8.6* 8.7*   PT/INR No results for input(s): LABPROT, INR in the last 72 hours. ABG No results for input(s): PHART, HCO3 in the last 72 hours.  Invalid input(s): PCO2, PO2  Studies/Results: No results found.  Anti-infectives: Anti-infectives (From admission, onward)   Start     Dose/Rate Route Frequency Ordered Stop   04/20/17 0800  vancomycin (VANCOCIN) IVPB 750 mg/150 ml premix     750 mg 150 mL/hr over 60 Minutes Intravenous Every 12 hours 04/20/17 0732     04/20/17 0530  vancomycin (VANCOCIN) IVPB 750 mg/150 ml premix  Status:  Discontinued     750 mg 150 mL/hr over 60 Minutes Intravenous Every 12 hours 04/19/17 2040 04/19/17 2241   04/20/17 0100  piperacillin-tazobactam (ZOSYN) IVPB 3.375 g  Status:  Discontinued     3.375 g 12.5 mL/hr over 240 Minutes Intravenous Every 8 hours 04/19/17 2040 04/19/17  2241   04/19/17 2330  levofloxacin (LEVAQUIN) IVPB 500 mg  Status:  Discontinued     500 mg 100 mL/hr over 60 Minutes Intravenous Every 24 hours 04/19/17 2259 04/20/17 0721   04/19/17 1715  piperacillin-tazobactam (ZOSYN) IVPB 3.375 g     3.375 g 100 mL/hr over 30 Minutes Intravenous  Once 04/19/17 1701 04/19/17 1745   04/19/17 1715  vancomycin (VANCOCIN) IVPB 1000 mg/200 mL premix  Status:  Discontinued     1,000 mg 200 mL/hr over 60 Minutes Intravenous  Once 04/19/17 1701 04/19/17 1704   04/19/17 1715  vancomycin (VANCOCIN) 1,750 mg in sodium chloride 0.9 % 500 mL IVPB     1,750 mg 250 mL/hr over 120 Minutes Intravenous  Once 04/19/17 1704 04/19/17 1928      Assessment/Plan: Status post bedside I&D of left labial abscess 4/24  Will return this afternoon to change packing. Gram stain shows rare gram-positive cocci consistent with patient's known history of MRSA, continue antibiotics  LOS: 6 days    Clovis Riley 04/25/2017

## 2017-04-25 NOTE — Progress Notes (Addendum)
Wound check: All packing and dressings removed from left labia. Induration is improving. No active drainage at the time of dressing change. Wound was repacked with 1/4" iodoform gauze using a long cotton tip applicator to get iodoform to reach wound base. Covered with dry dressing.  Orders for [facility] RN for would care were written. Patient initially thought her sister would be able to help her with wound care at home but now she is not sure.   Obie Dredge, PA-C [facility] Pager: [phone] Consults: [phone] Mon-Fri 7:00 am-4:30 pm Sat-Sun 7:00 am-11:30 am

## 2017-04-25 NOTE — Progress Notes (Signed)
PROGRESS NOTE  Gabrielle Miller UUV:[id number] DOB: 12-Oct-1940 DOA: 04/19/2017 PCP: Ocie Doyne., MD  HPI/Recap of past 50 hours: 77 year old female brought to the hospital on 4/19 with shortness of breath, fever, chills and altered mental status. Initial concern for possible pulmonary infection, but chest x-ray does not show any clear pneumonia. She is found to have 2 out of 2 positive sets of blood cultures for staph aureus. Currently on intravenous vancomycin.  Patient underwent TEE on 04/23/2017 and the finding is stated to be worrisome for possible Aortic valve vegetation.  Given findings, ID plans for 6 weeks of IV antibiotics.   On 04/24/17, patient complains of labial boil.  Seen by general surgery and underwent bedside debridement.  Today, patient resting comfortably.  No complaints.  Assessment/Plan: Principal Problem:   Aortic valve endocarditis Active Problems:   Essential hypertension   GERD (gastroesophageal reflux disease)   Depression   PAF (paroxysmal atrial fibrillation) (HCC)   CHF with right heart failure (HCC)   Acute on chronic respiratory failure with hypoxia (HCC)   Sepsis (HCC)   Acute metabolic encephalopathy   Acute respiratory failure with hypoxia (HCC)   MRSA bacteremia   Hypoxemia  MRSA bacteremia complicated by aortic valve vegetation ID consulted and following Continue IV vancomycin Continue close monitor on telemetry Repeated blood cultures x2 done on 04/21/2017- with no growth to date.  Will order PICC line for long-term IV antibiotics Repeat CBC in the morning  Essential hypertension: Blood pressure elevated since admission 140s-160s.  Restarting Lasix and beta-blocker.  Left labial boil Status post debridement by general surgery.  Stable. Continue IV vancomycin  Hypokalemia: Replace as needed  Iron deficiency anemia Low trans sat and low iron, on iron replacement therapy.  Hemoglobin stable.  Code Status: Full code  Family  Communication: None at bedside  Disposition Plan: Will stay another midnight to continue IV antibiotics.   Consultants:  Infectious disease  General surgery  Procedures:  TEE done 4/23: Questionable aortic valve vegetation.   2D echo done 4/21:Preserved ejection fraction, did not comment on diastolic dysfunction  Antimicrobials:  IV vancomycin 4/20- 6/2  DVT prophylaxis: Subcu Lovenox   Objective: Vitals:   04/24/17 0911 04/24/17 1647 04/25/17 0008 04/25/17 0814  BP: (!) 199/130 (!) 144/96 (!) 137/96 (!) 153/86  Pulse: 96 (!) 101 92 87  Resp: (!) 21 (!) 24 20 (!) 25  Temp:  98.4 F (36.9 C) 98.4 F (36.9 C) 97.9 F (36.6 C)  TempSrc:  Oral Oral Oral  SpO2: 100% 100% 96% 95%  Weight:      Height:        Intake/Output Summary (Last 24 hours) at 04/25/2017 1244 Last data filed at 04/25/2017 0514 Gross per 24 hour  Intake 566.15 ml  Output 220 ml  Net 346.15 ml   Filed Weights   04/19/17 1704 04/22/17 0619 04/23/17 0829  Weight: 72.6 kg (160 lb) 77.8 kg (171 lb 8.3 oz) 77.6 kg (171 lb)    Exam:  General: Alert and oriented x3, no acute distress HEENT: Normocephalic, atraumatic, mucous membranes are moist Neck: Supple, no JVD Cardiovascular: Regular rate and rhythm, S1-S2 Lungs: Clear to auscultation bilaterally Abdomen: Soft, nontender, nondistended, positive bowel sounds Extremities: No clubbing or cyanosis, trace pitting edema Neuro: No focal deficits Skin: No skin breaks, tears or lesions Psychiatric: Patient is appropriate, no evidence of psychosis  Data Reviewed: CBC: Recent Labs  Lab 04/19/17 1655 04/21/17 0308 04/22/17 0647 04/25/17 0521  WBC 14.9* 13.1*  11.2* 13.6*  NEUTROABS 12.4*  --   --   --   HGB 10.1* 8.1* 8.8* 9.2*  HCT 32.6* 26.4* 28.3* 29.3*  MCV 82.1 81.2 80.9 79.2  PLT 417* 353 379 397*   Basic Metabolic Panel: Recent Labs  Lab 04/19/17 1655 04/21/17 0308 04/21/17 0800 04/22/17 0248 04/24/17 0426 04/25/17 0521  NA  135 136  --  134* 145 135  K 3.7 3.4*  --  3.6 3.3* 3.5  CL 97* 103  --  105 106 103  CO2 24 24  --  22 29 23   GLUCOSE 92 103*  --  107* 112* 107*  BUN 10 13  --  9 6 9   CREATININE 0.84 0.83  --  0.71 0.68 0.70  CALCIUM 8.5* 8.2*  --  8.2* 8.6* 8.7*  MG  --   --  1.8  --   --   --    GFR: Estimated Creatinine Clearance: 59.7 mL/min (by C-G formula based on SCr of 0.7 mg/dL). Liver Function Tests: Recent Labs  Lab 04/19/17 1655  AST 23  ALT 10*  ALKPHOS 106  BILITOT 1.1  PROT 7.7  ALBUMIN 3.1*   No results for input(s): LIPASE, AMYLASE in the last 168 hours. Recent Labs  Lab 04/19/17 2020  AMMONIA 24   Coagulation Profile: No results for input(s): INR, PROTIME in the last 168 hours. Cardiac Enzymes: Recent Labs  Lab 04/19/17 1655  TROPONINI <0.03   BNP (last 3 results) No results for input(s): PROBNP in the last 8760 hours. HbA1C: No results for input(s): HGBA1C in the last 72 hours. CBG: Recent Labs  Lab 04/21/17 1219 04/21/17 1626 04/21/17 2102 04/22/17 0752 04/22/17 1216  GLUCAP 123* 114* 159* 106* 87   Lipid Profile: No results for input(s): CHOL, HDL, LDLCALC, TRIG, CHOLHDL, LDLDIRECT in the last 72 hours. Thyroid Function Tests: No results for input(s): TSH, T4TOTAL, FREET4, T3FREE, THYROIDAB in the last 72 hours. Anemia Panel: No results for input(s): VITAMINB12, FOLATE, FERRITIN, TIBC, IRON, RETICCTPCT in the last 72 hours. Urine analysis:    Component Value Date/Time   COLORURINE STRAW (A) 04/19/2017 1729   APPEARANCEUR CLEAR 04/19/2017 1729   LABSPEC 1.008 04/19/2017 1729   PHURINE 6.0 04/19/2017 1729   GLUCOSEU NEGATIVE 04/19/2017 1729   HGBUR SMALL (A) 04/19/2017 1729   BILIRUBINUR NEGATIVE 04/19/2017 1729   KETONESUR NEGATIVE 04/19/2017 1729   PROTEINUR NEGATIVE 04/19/2017 1729   NITRITE NEGATIVE 04/19/2017 1729   LEUKOCYTESUR NEGATIVE 04/19/2017 1729   Sepsis Labs: @LABRCNTIP (procalcitonin:4,lacticidven:4)  ) Recent Results  (from the past 240 hour(s))  Blood Culture (routine x 2)     Status: Abnormal   Collection Time: 04/19/17  4:49 PM  Result Value Ref Range Status   Specimen Description BLOOD RIGHT ANTECUBITAL  Final   Special Requests   Final    BOTTLES DRAWN AEROBIC AND ANAEROBIC Blood Culture adequate volume   Culture  Setup Time   Final    GRAM POSITIVE COCCI IN BOTH AEROBIC AND ANAEROBIC BOTTLES CRITICAL RESULT CALLED TO, READ BACK BY AND VERIFIED WITH: VERANDA Smoke Rise PHARMD AT [place] [id number] BY SJW Performed at [facility], [place] [address], [place], [place] [address]    Culture METHICILLIN RESISTANT STAPHYLOCOCCUS AUREUS (A)  Final   Report Status 04/22/2017 FINAL  Final   Organism ID, Bacteria METHICILLIN RESISTANT STAPHYLOCOCCUS AUREUS  Final      Susceptibility   Methicillin resistant staphylococcus aureus - MIC*    CIPROFLOXACIN >=8 RESISTANT Resistant  ERYTHROMYCIN >=8 RESISTANT Resistant     GENTAMICIN <=0.5 SENSITIVE Sensitive     OXACILLIN >=4 RESISTANT Resistant     TETRACYCLINE <=1 SENSITIVE Sensitive     VANCOMYCIN <=0.5 SENSITIVE Sensitive     TRIMETH/SULFA <=10 SENSITIVE Sensitive     CLINDAMYCIN <=0.25 SENSITIVE Sensitive     RIFAMPIN <=0.5 SENSITIVE Sensitive     Inducible Clindamycin NEGATIVE Sensitive     * METHICILLIN RESISTANT STAPHYLOCOCCUS AUREUS  Blood Culture ID Panel (Reflexed)     Status: Abnormal   Collection Time: 04/19/17  4:49 PM  Result Value Ref Range Status   Enterococcus species NOT DETECTED NOT DETECTED Final   Listeria monocytogenes NOT DETECTED NOT DETECTED Final   Staphylococcus species DETECTED (A) NOT DETECTED Final    Comment: CRITICAL RESULT CALLED TO, READ BACK BY AND VERIFIED WITH: VERANDA BYRK PHARMD AT 0715 ON [id number] BY SJW    Staphylococcus aureus DETECTED (A) NOT DETECTED Final    Comment: Methicillin (oxacillin)-resistant Staphylococcus aureus (MRSA). MRSA is predictably resistant to beta-lactam antibiotics (except ceftaroline).  Preferred therapy is vancomycin unless clinically contraindicated. Patient requires contact precautions if  hospitalized. CRITICAL RESULT CALLED TO, READ BACK BY AND VERIFIED WITH: VERANDA BYRK PHARMD AT 0715 ON [id number] BY SJW    Methicillin resistance DETECTED (A) NOT DETECTED Final    Comment: CRITICAL RESULT CALLED TO, READ BACK BY AND VERIFIED WITH: VERANDA BYRK PHARMD  AT 0715 ON [id number] BY SJW    Streptococcus species NOT DETECTED NOT DETECTED Final   Streptococcus agalactiae NOT DETECTED NOT DETECTED Final   Streptococcus pneumoniae NOT DETECTED NOT DETECTED Final   Streptococcus pyogenes NOT DETECTED NOT DETECTED Final   Acinetobacter baumannii NOT DETECTED NOT DETECTED Final   Enterobacteriaceae species NOT DETECTED NOT DETECTED Final   Enterobacter cloacae complex NOT DETECTED NOT DETECTED Final   Escherichia coli NOT DETECTED NOT DETECTED Final   Klebsiella oxytoca NOT DETECTED NOT DETECTED Final   Klebsiella pneumoniae NOT DETECTED NOT DETECTED Final   Proteus species NOT DETECTED NOT DETECTED Final   Serratia marcescens NOT DETECTED NOT DETECTED Final   Haemophilus influenzae NOT DETECTED NOT DETECTED Final   Neisseria meningitidis NOT DETECTED NOT DETECTED Final   Pseudomonas aeruginosa NOT DETECTED NOT DETECTED Final   Candida albicans NOT DETECTED NOT DETECTED Final   Candida glabrata NOT DETECTED NOT DETECTED Final   Candida krusei NOT DETECTED NOT DETECTED Final   Candida parapsilosis NOT DETECTED NOT DETECTED Final   Candida tropicalis NOT DETECTED NOT DETECTED Final    Comment: Performed at [facility], [place]. [address] [place], [place] [address]  Urine culture     Status: None   Collection Time: 04/19/17  5:01 PM  Result Value Ref Range Status   Specimen Description URINE, CATHETERIZED  Final   Special Requests Normal  Final   Culture   Final    NO GROWTH Performed at [facility], [address], [place], [place] [address]    Report Status  04/20/2017 FINAL  Final  Blood Culture (routine x 2)     Status: Abnormal   Collection Time: 04/19/17  5:08 PM  Result Value Ref Range Status   Specimen Description BLOOD LEFT ANTECUBITAL  Final   Special Requests   Final    BOTTLES DRAWN AEROBIC AND ANAEROBIC Blood Culture adequate volume   Culture  Setup Time   Final    GRAM POSITIVE COCCI IN BOTH AEROBIC AND ANAEROBIC BOTTLES CRITICAL VALUE NOTED.  VALUE  IS CONSISTENT WITH PREVIOUSLY REPORTED AND CALLED VALUE.    Culture (A)  Final    STAPHYLOCOCCUS AUREUS SUSCEPTIBILITIES PERFORMED ON PREVIOUS CULTURE WITHIN THE LAST 5 DAYS. Performed at [facility], [place] [address] [place], [place] [address]    Report Status 04/22/2017 FINAL  Final  Culture, blood (routine x 2)     Status: None (Preliminary result)   Collection Time: 04/21/17 12:58 PM  Result Value Ref Range Status   Specimen Description BLOOD RIGHT ANTECUBITAL  Final   Special Requests AEROBIC BOTTLE ONLY Blood Culture adequate volume  Final   Culture   Final    NO GROWTH 3 DAYS Performed at [facility], [place] [address], [place], [place] [address]    Report Status PENDING  Incomplete  Culture, blood (routine x 2)     Status: None (Preliminary result)   Collection Time: 04/21/17  1:00 PM  Result Value Ref Range Status   Specimen Description BLOOD RIGHT ANTECUBITAL  Final   Special Requests AEROBIC BOTTLE ONLY Blood Culture adequate volume  Final   Culture   Final    NO GROWTH 3 DAYS Performed at [facility], [place] [address], [place], [place] [address]    Report Status PENDING  Incomplete  Aerobic Culture (superficial specimen)     Status: None (Preliminary result)   Collection Time: 04/24/17  3:44 PM  Result Value Ref Range Status   Specimen Description ABSCESS VULVA  Final   Special Requests NONE  Final   Gram Stain   Final    ABUNDANT WBC PRESENT,BOTH PMN AND MONONUCLEAR RARE GRAM POSITIVE COCCI Performed at [facility], [address], [place], [place] [address]    Culture MODERATE STAPHYLOCOCCUS AUREUS  Final   Report Status PENDING  Incomplete      Studies: No results found.  Scheduled Meds: . diclofenac sodium  2 g Topical QID  . DULoxetine  30 mg Oral Daily  . enoxaparin (LOVENOX) injection  40 mg Subcutaneous Q24H  . ferrous sulfate  325 mg Oral Q breakfast  . flecainide  50 mg Oral BID  . lidocaine-EPINEPHrine  15 mL Intradermal Once  . mouth rinse  15 mL Mouth Rinse BID  . metoprolol succinate  25 mg Oral Daily  . pantoprazole  40 mg Oral Daily  . pravastatin  20 mg Oral Daily    Continuous Infusions: . vancomycin Stopped (04/24/17 2323)     LOS: 6 days     Annita Brod, MD [facility] Pager [phone]  If 7PM-7AM, please contact night-coverage [email or web address] Password [facility] 04/25/2017, 12:44 PM

## 2017-04-25 NOTE — Plan of Care (Signed)
  Problem: Elimination: Goal: Will not experience complications related to urinary retention Outcome: Not Progressing   Problem: Pain Managment: Goal: General experience of comfort will improve Outcome: Not Progressing   

## 2017-04-25 NOTE — Progress Notes (Signed)
Peripherally Inserted Central Catheter/Midline Placement  The IV Nurse has discussed with the patient and/or persons authorized to consent for the patient, the purpose of this procedure and the potential benefits and risks involved with this procedure.  The benefits include less needle sticks, lab draws from the catheter, and the patient may be discharged home with the catheter. Risks include, but not limited to, infection, bleeding, blood clot (thrombus formation), and puncture of an artery; nerve damage and irregular heartbeat and possibility to perform a PICC exchange if needed/ordered by physician.  Alternatives to this procedure were also discussed.  Bard Power PICC patient education guide, fact sheet on infection prevention and patient information card has been provided to patient /or left at bedside.    PICC/Midline Placement Documentation  PICC Single Lumen 79/48/01 PICC Right Basilic 38 cm 0 cm (Active)  Indication for Insertion or Continuance of Line Home intravenous therapies (PICC only) 04/25/2017  8:20 PM  Exposed Catheter (cm) 0 cm 04/25/2017  8:20 PM  Site Assessment Clean;Dry;Intact 04/25/2017  8:20 PM  Line Status Blood return noted;Flushed;Saline locked 04/25/2017  8:20 PM  Dressing Type Transparent 04/25/2017  8:20 PM  Dressing Status Clean;Dry;Intact;Antimicrobial disc in place 04/25/2017  8:20 PM  Dressing Intervention New dressing 04/25/2017  8:20 PM  Dressing Change Due 05/02/17 04/25/2017  8:20 PM       Aldona Lento L 04/25/2017, 8:41 PM

## 2017-04-25 NOTE — Progress Notes (Signed)
PHARMACY CONSULT NOTE FOR:  OUTPATIENT  PARENTERAL ANTIBIOTIC THERAPY (OPAT)  Indication: MRSA AV endocarditis Regimen: Vancomycin 750 mg IV every 12 hours End date: 06/02/17  IV antibiotic discharge orders are pended. To discharging provider:  please sign these orders via discharge navigator,  Select New Orders & click on the button choice - Manage This Unsigned Work.    Thank you for allowing pharmacy to be a part of this patient's care.  Lawson Radar 04/25/2017, 1:31 PM

## 2017-04-26 DIAGNOSIS — A4102 Sepsis due to Methicillin resistant Staphylococcus aureus: Principal | ICD-10-CM

## 2017-04-26 DIAGNOSIS — I38 Endocarditis, valve unspecified: Secondary | ICD-10-CM | POA: Diagnosis not present

## 2017-04-26 DIAGNOSIS — B9562 Methicillin resistant Staphylococcus aureus infection as the cause of diseases classified elsewhere: Secondary | ICD-10-CM | POA: Diagnosis not present

## 2017-04-26 LAB — AEROBIC CULTURE W GRAM STAIN (SUPERFICIAL SPECIMEN)

## 2017-04-26 LAB — CBC
HCT: 26 % — ABNORMAL LOW (ref 36.0–46.0)
HEMOGLOBIN: 8.3 g/dL — AB (ref 12.0–15.0)
MCH: 25.2 pg — AB (ref 26.0–34.0)
MCHC: 31.9 g/dL (ref 30.0–36.0)
MCV: 79 fL (ref 78.0–100.0)
PLATELETS: 460 10*3/uL — AB (ref 150–400)
RBC: 3.29 MIL/uL — ABNORMAL LOW (ref 3.87–5.11)
RDW: 18 % — ABNORMAL HIGH (ref 11.5–15.5)
WBC: 13.1 10*3/uL — AB (ref 4.0–10.5)

## 2017-04-26 LAB — BASIC METABOLIC PANEL
ANION GAP: 11 (ref 5–15)
BUN: 7 mg/dL (ref 6–20)
CHLORIDE: 101 mmol/L (ref 101–111)
CO2: 24 mmol/L (ref 22–32)
Calcium: 8.4 mg/dL — ABNORMAL LOW (ref 8.9–10.3)
Creatinine, Ser: 0.73 mg/dL (ref 0.44–1.00)
GFR calc non Af Amer: >60 mL/min (ref >60)
Glucose, Bld: 95 mg/dL (ref 65–99)
POTASSIUM: 3 mmol/L — AB (ref 3.5–5.1)
SODIUM: 136 mmol/L (ref 135–145)

## 2017-04-26 LAB — CULTURE, BLOOD (ROUTINE X 2)
CULTURE: NO GROWTH
Culture: NO GROWTH
SPECIAL REQUESTS: ADEQUATE
Special Requests: ADEQUATE

## 2017-04-26 LAB — AEROBIC CULTURE  (SUPERFICIAL SPECIMEN)

## 2017-04-26 MED ORDER — TRAZODONE HCL 50 MG PO TABS
50.0000 mg | ORAL_TABLET | Freq: Once | ORAL | Status: AC
  Administered 2017-04-26: 50 mg via ORAL
  Filled 2017-04-26: qty 1

## 2017-04-26 MED ORDER — VANCOMYCIN IV (FOR PTA / DISCHARGE USE ONLY): 750.0000 mg | Freq: Two times a day (BID) | INTRAVENOUS | 0 refills | Status: AC

## 2017-04-26 NOTE — Care Management Important Message (Signed)
Important Message  Patient Details  Name: Gabrielle Miller MRN: [id number] Date of Birth: 01-11-1940   Medicare Important Message Given:  Yes    Zenon Mayo, RN 04/26/2017, 9:23 AM

## 2017-04-26 NOTE — Discharge Summary (Signed)
Discharge Summary  Gabrielle Miller ZOX:[id number] DOB: 12-Jan-1940  PCP: Ocie Doyne., MD  Admit date: 04/19/2017 Discharge date: 04/26/2017  Time spent: 25 minutes  Recommendations for Outpatient Follow-up:  1. Patient will be on IV vancomycin 750 mg every 12 hours for the next 5 weeks until 6/2.  Home health will monitor levels and adjust doses as needed. 2. Patient will follow-up with [facility] on 5/2 for labial wound check 3. Patient will follow-up with her PCP in the next 1 month Wound care labial wound: daily dressing changes with iodoform packing - repack if packing comes out or is soiled with toileting.  Also recommend sitz baths 4. Patient will need an transesophageal echocardiogram following completion of IV antibiotics.  Discharge Diagnoses:  Active Hospital Problems   Diagnosis Date Noted  . 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 Hypoxemia   . MRSA bacteremia 04/20/2017  . Acute on chronic respiratory failure with hypoxia ([place]) 04/19/2017  . Sepsis ([place]) 04/19/2017  . Acute metabolic encephalopathy 81/19/1478  . Acute respiratory failure with hypoxia ([place]) 04/19/2017  . PAF (paroxysmal atrial fibrillation) ([place])   . GERD (gastroesophageal reflux disease)   . Essential hypertension   . Depression   . CHF with right heart failure [facility])     Resolved Hospital Problems  No resolved problems to display.    Discharge Condition: Improved, being discharged home  Diet recommendation: Her healthy  Vitals:   04/26/17 0750 04/26/17 1007  BP: (!) 147/76 (!) 171/97  Pulse: (!) 112 (!) 119  Resp: 18   Temp: 99 F (37.2 C)   SpO2: 97%     History of present illness and hospital course:  77 year old female brought to the hospital on 4/19 with shortness of breath, fever, chills and altered mental status. Initial concern for possible pulmonary infection, but chest x-ray does not show any clear pneumonia. She is found to have 2 out of 2 positive sets of blood  cultures for staph aureus. Currently on intravenous vancomycin.Patient underwent TEE on 04/23/2017 and the finding is stated to be worrisome for possible Aorticvalve vegetation.  Given findings, ID plans for 6 weeks of IV antibiotics.   On 04/24/17, patient complains of labial boil.  Seen by general surgery and underwent bedside debridement.  Today, patient resting comfortably.  No complaints.   Hospital Course:  Principal Problem:   Aortic valve endocarditis Active Problems:   Essential hypertension   GERD (gastroesophageal reflux disease)   Depression   PAF (paroxysmal atrial fibrillation) (H[facility])   CHF with right heart failure (HCC)   Acute on chronic respiratory failure with hypoxia (HCC)   Sepsis (HCC)   Acute metabolic encephalopathy   Acute respiratory failure with hypoxia (HCC)   MRSA bacteremia   Hypoxemia  MRSA bacteremia complicated by aortic valve vegetation ID consulted and following Continue IV vancomycin Continue close monitor on telemetry Repeated blood cultures x2 done on 04/21/2017- with no growth to date.    PICC line placed 4/25 and patient will continue IV antibiotics at home to home health until 6/2.  Follow-up with PCP in 1 month.  She will need a repeat echocardiogram following completion of IV antibiotics Repeat CBC in the morning  Essential hypertension: Blood pressure elevated since admission 140s-160s.  Restarting Lasix and beta-blocker.  Metabolic encephalopathy: Present on admission, felt to be secondary to sepsis.  Resolved once infection treated.  Chronic diastolic heart failure: Euvolemic.  Continue Lasix on discharge.  Paroxysmal A. fib: Managed with IV Lopressor  during hospitalization.  Chads 2 score 5.  Not on anticoagulation secondary to GI bleed history  Left labial boil Status post debridement by general surgery.  Stable.  Continue wound dressing as above.  Follow-up with general surgery for wound check on 5/2 Continue IV  vancomycin  Hypokalemia: Replace as needed  Iron deficiency anemia Low trans sat and low iron, on iron replacement therapy.  Hemoglobin stable.  Consultants:  Infectious disease  General surgery  Cardiology  Procedures:  TEE done 4/23: Questionable aortic valve vegetation.   2D echo done 4/21:Preserved ejection fraction, did not comment on diastolic dysfunction   Discharge Exam: BP (!) 171/97   Pulse (!) 119   Temp 99 F (37.2 C) (Oral)   Resp 18   Ht '5\' 5"'$  (1.651 m)   Wt 77.6 kg (171 lb)   SpO2 97%   BMI 28.46 kg/m   General: Alert oriented x3, no acute distress Cardiovascular: Irregular rhythm, rate controlled Respiratory: Clear to auscultation bilaterally  Discharge Instructions You were cared for by a hospitalist during your hospital stay. If you have any questions about your discharge medications or the care you received while you were in the hospital after you are discharged, you can call the unit and asked to speak with the hospitalist on call if the hospitalist that took care of you is not available. Once you are discharged, your primary care physician will handle any further medical issues. Please note that NO REFILLS for any discharge medications will be authorized once you are discharged, as it is imperative that you return to your primary care physician (or establish a relationship with a primary care physician if you do not have one) for your aftercare needs so that they can reassess your need for medications and monitor your lab values.  Discharge Instructions    Diet - low sodium heart healthy   Complete by:  As directed    Home infusion instructions [facility] May follow [place] Dosing Protocol; May administer Cathflo as needed to maintain patency of vascular access device.; Flushing of vascular access device: per [facility] Protocol: 0.9% NaCl pre/post medica...   Complete by:  As directed    Instructions:  May follow [place] Dosing  Protocol   Instructions:  May administer Cathflo as needed to maintain patency of vascular access device.   Instructions:  Flushing of vascular access device: per [facility] Protocol: 0.9% NaCl pre/post medication administration and prn patency; Heparin 100 u/ml, 62m for implanted ports and Heparin 10u/ml, 591mfor all other central venous catheters.   Instructions:  May follow [facility] Anaphylaxis Protocol for First Dose Administration in the home: 0.9% NaCl at 25-50 ml/hr to maintain IV access for protocol meds. Epinephrine 0.3 ml IV/IM PRN and Benadryl 25-50 IV/IM PRN s/s of anaphylaxis.   Instructions:  [facility] Coordinator (RN) to assist per patient IV care needs in the home PRN.   Increase activity slowly   Complete by:  As directed      Allergies as of 04/26/2017      Reactions   Penicillins Hives   Has patient had a PCN reaction causing immediate rash, facial/tongue/throat swelling, SOB or lightheadedness with hypotension: Yes Has patient had a PCN reaction causing severe rash involving mucus membranes or skin necrosis: No Has patient had a PCN reaction that required hospitalization: No Has patient had a PCN reaction occurring within the last 10 years: Yes If all of the above answers are "NO", then may proceed  with Cephalosporin use.   Ivp Dye [iodinated Diagnostic Agents] Nausea And Vomiting      Medication List    TAKE these medications   acetaminophen 500 MG tablet Commonly known as:  TYLENOL Take 1,000 mg by mouth every 6 (six) hours as needed (for arthritis pain).   ADVAIR HFA 115-21 MCG/ACT inhaler Generic drug:  fluticasone-salmeterol Inhale 2 puffs into the lungs 2 (two) times daily as needed (for flares).   DULoxetine 30 MG capsule Commonly known as:  CYMBALTA Take 30 mg by mouth daily.   flecainide 50 MG tablet Commonly known as:  TAMBOCOR Take 50 mg by mouth 2 (two) times daily.   furosemide 40 MG tablet Commonly known as:  LASIX Take 40 mg by mouth  daily.   metoprolol succinate 50 MG 24 hr tablet Commonly known as:  TOPROL-XL Take 50 mg by mouth daily. Take with or immediately following a meal.   omeprazole 20 MG capsule Commonly known as:  PRILOSEC Take 20 mg by mouth daily before breakfast.   oxyCODONE-acetaminophen 10-325 MG tablet Commonly known as:  PERCOCET Take 1 tablet by mouth every 6 (six) hours as needed for pain.   potassium chloride 10 MEQ tablet Commonly known as:  K-DUR,KLOR-CON Take 10 mEq by mouth daily.   pravastatin 20 MG tablet Commonly known as:  PRAVACHOL Take 20 mg by mouth daily.   UNABLE TO FIND Unnamed pain cream for rheumatoid arthritis: Apply to painful sites two to three times a day as needed for pain   vancomycin IVPB Inject 750 mg into the vein every 12 (twelve) hours. Indication:  MRSA Endocarditis Last Day of Therapy:  06/02/17 Labs - 'Sunday/Monday:  CBC/D, BMP, and vancomycin trough. Labs - Thursday:  BMP and vancomycin trough Labs - Every other week:  ESR and CRP            Home Infusion Instuctions  (From admission, onward)        Start     Ordered   04/26/17 0000  Home infusion instructions [facility] May follow [facility] Dosing Protocol; May administer Cathflo as needed to maintain patency of vascular access device.; Flushing of vascular access device: per [facility] Protocol: 0.9% NaCl pre/post medica...    Question Answer Comment  Instructions May follow [facility] Dosing Protocol   Instructions May administer Cathflo as needed to maintain patency of vascular access device.   Instructions Flushing of vascular access device: per [facility] Protocol: 0.9% NaCl pre/post medication administration and prn patency; Heparin 100 u/ml, 5ml for implanted ports and Heparin 10u/ml, 5ml for all other central venous catheters.   Instructions May follow [facility] Anaphylaxis Protocol for First Dose Administration in the home: 0.9% NaCl at 25-50 ml/hr to maintain IV access for protocol meds.  Epinephrine 0.3 ml IV/IM PRN and Benadryl 25-50 IV/IM PRN s/s of anaphylaxis.   Instructions [facility] Infusion Coordinator (RN) to assist per patient IV care needs in the home PRN.      04'$ /26/19 1243     Allergies  Allergen Reactions  . Penicillins Hives    Has patient had a PCN reaction causing immediate rash, facial/tongue/throat swelling, SOB or lightheadedness with hypotension: Yes Has patient had a PCN reaction causing severe rash involving mucus membranes or skin necrosis: No Has patient had a PCN reaction that required hospitalization: No Has patient had a PCN reaction occurring within the last 10 years: Yes If all of the above answers are "NO", then may proceed with Cephalosporin  use.    . Ivp Dye [Iodinated Diagnostic Agents] Nausea And Vomiting   Follow-up Information    [facility] Follow up on 05/02/2017.   Specialty:  General Surgery Why:  at 3:00 PM for a wound check. please arrive 30 minutes early to get checked in and fill out any necessary paperwork. Contact information: [address] [place] [employer] [phone]       Ocie Doyne., MD Follow up in 1 month(s).   Specialty:  Family Medicine Contact information: [place] [address] [phone]            The results of significant diagnostics from this hospitalization (including imaging, microbiology, ancillary and laboratory) are listed below for reference.    Significant Diagnostic Studies: Ct Head Wo Contrast  Result Date: 04/19/2017 CLINICAL DATA:  Initial evaluation for acute altered mental status. EXAM: CT HEAD WITHOUT CONTRAST TECHNIQUE: Contiguous axial images were obtained from the base of the skull through the vertex without intravenous contrast. COMPARISON:  None. FINDINGS: Brain: Generalized age-related cerebral atrophy with mild chronic small vessel ischemic disease. No acute intracranial hemorrhage. No acute large  vessel territory infarct. No mass lesion, midline shift or mass effect. No hydrocephalus. No extra-axial fluid collection. Vascular: No hyperdense vessel. Scattered vascular calcifications noted within the carotid siphons. Skull: No scalp soft tissue abnormality. Calvarium intact. Bones are diffusely demineralized. Sinuses/Orbits: Globes orbital soft tissues within normal limits. Scattered mucosal thickening within the ethmoidal air cells. Paranasal sinuses are otherwise clear. No mastoid effusion. Other: None. IMPRESSION: 1. No acute intracranial abnormality. 2. Age-related cerebral atrophy with mild chronic small vessel ischemic disease. 3. Mild ethmoidal sinusitis. Electronically Signed   By: Jeannine Boga M.D.   On: 04/19/2017 23:31   Mr Lumbar Spine W Wo Contrast  Result Date: 04/22/2017 CLINICAL DATA:  Initial evaluation for low back pain, bacteremia. EXAM: MRI LUMBAR SPINE WITHOUT AND WITH CONTRAST TECHNIQUE: Multiplanar and multiecho pulse sequences of the lumbar spine were obtained without and with intravenous contrast. CONTRAST:  71m MULTIHANCE GADOBENATE DIMEGLUMINE 529 MG/ML IV SOLN COMPARISON:  None. FINDINGS: Segmentation:  Study moderately degraded by motion artifact. Normal segmentation. Lowest well-formed disc labeled the L5-S1 level. Alignment: 3 mm anterolisthesis of L3 on L4 and L4 on L5. Vertebral bodies otherwise normally aligned with preservation of the normal lumbar lordosis. Vertebrae: Vertebral body heights maintained without evidence for acute or chronic fracture. Bone marrow signal intensity within normal limits. No discrete or worrisome osseous lesions. No findings to suggest osteomyelitis discitis no significant inflammatory changes seen about the facets. Conus medullaris and cauda equina: Conus extends to the L2 level. Conus and cauda equina appear normal. Paraspinal and other soft tissues: No definite acute paraspinous soft tissue abnormality identified. Mildly elevated  STIR signal intensity without associated enhancement within the posterior paraspinous musculature favored to be artifactual. T2 hyperintense cyst noted within the left kidney. Visualized visceral structures otherwise unremarkable. Disc levels: L1-2:  Unremarkable. L2-3: Mild disc desiccation with mild disc bulge. No significant canal or foraminal stenosis. L3-4: Mild anterolisthesis. Mild diffuse disc bulge with intervertebral disc space narrowing. Mild facet and ligament flavum hypertrophy. 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 Mild spinal stenosis. Very mild bilateral foraminal narrowing. L4-5: Anterolisthesis. Mild diffuse disc bulge with intervertebral disc space narrowing. Mild to moderate facet and ligament flavum hypertrophy. Resultant mild canal with bilateral lateral recess stenosis. Mild bilateral foraminal narrowing, slightly worse on the right. 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 L5-S1: Shallow posterior disc bulge with disc desiccation. Mild facet hypertrophy. 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 No significant stenosis. IMPRESSION: 1. No acute abnormality within the lumbar spine. No findings to suggest osteomyelitis/discitis or other intraspinal infection. 2. 3 mm anterolisthesis of L3 on L4 and L4 on L5 with resultant mild canal and bilateral foraminal stenosis. 3. 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 Electronically Signed   By: Jeannine Boga M.D.   On: 04/22/2017 21:30   Dg Chest Port 1 View  Result Date: 04/19/2017 CLINICAL DATA:  Shortness of breath and fever. EXAM: PORTABLE CHEST 1 VIEW COMPARISON:  Single-view of the chest 06/30/2016 and 11/12/2015. CT chest 08/11/2010. FINDINGS: There is cardiomegaly without edema. Aortic atherosclerosis is noted. Mild right basilar atelectasis is seen. Hiatal hernia is noted. IMPRESSION: Cardiomegaly without acute disease. Hiatal hernia. Atherosclerosis.  Electronically Signed   By: Inge Rise M.D.   On: 04/19/2017 17:19   [place] Ekg Site Rite  Result Date: 04/25/2017 If Site Rite image not attached, placement could not be confirmed due to current cardiac rhythm.   Microbiology: Recent Results (from the past 240 hour(s))  Blood Culture (routine x 2)     Status: Abnormal   Collection Time: 04/19/17  4:49 PM  Result Value Ref Range Status   Specimen Description BLOOD RIGHT ANTECUBITAL  Final   Special Requests   Final    BOTTLES DRAWN AEROBIC AND ANAEROBIC Blood Culture adequate volume   Culture  Setup Time   Final    GRAM POSITIVE COCCI IN BOTH AEROBIC AND ANAEROBIC BOTTLES CRITICAL RESULT CALLED TO, READ BACK BY AND VERIFIED WITH: VERANDA [place] PHARMD AT [id number] BY SJW Performed at [facility], [place] [address] [place], [place] [address]    Culture METHICILLIN RESISTANT STAPHYLOCOCCUS AUREUS (A)  Final   Report Status 04/22/2017 FINAL  Final   Organism ID, Bacteria METHICILLIN RESISTANT STAPHYLOCOCCUS AUREUS  Final      Susceptibility   Methicillin resistant staphylococcus aureus - MIC*    CIPROFLOXACIN >=8 RESISTANT Resistant     ERYTHROMYCIN >=8 RESISTANT Resistant     GENTAMICIN <=0.5 SENSITIVE Sensitive     OXACILLIN >=4 RESISTANT Resistant     TETRACYCLINE <=1 SENSITIVE Sensitive     VANCOMYCIN <=0.5 SENSITIVE Sensitive     TRIMETH/SULFA <=10 SENSITIVE Sensitive     CLINDAMYCIN <=0.25 SENSITIVE Sensitive     RIFAMPIN <=0.5 SENSITIVE Sensitive     Inducible Clindamycin NEGATIVE Sensitive     * METHICILLIN RESISTANT STAPHYLOCOCCUS AUREUS  Blood Culture ID Panel (Reflexed)     Status: Abnormal   Collection Time: 04/19/17  4:49 PM  Result Value Ref Range Status   Enterococcus species NOT DETECTED NOT DETECTED Final   Listeria monocytogenes NOT DETECTED NOT DETECTED Final   Staphylococcus species DETECTED (A) NOT DETECTED Final    Comment: CRITICAL RESULT CALLED TO, READ BACK BY AND VERIFIED WITH: VERANDA BYRK  PHARMD AT 0715 ON [id number] BY SJW    Staphylococcus aureus DETECTED (A) NOT DETECTED Final    Comment: Methicillin (oxacillin)-resistant Staphylococcus aureus (MRSA). MRSA is predictably resistant to beta-lactam antibiotics (except ceftaroline). Preferred therapy is vancomycin unless clinically contraindicated. Patient requires contact precautions if  hospitalized. CRITICAL RESULT CALLED TO, READ BACK BY AND VERIFIED WITH: VERANDA BYRK PHARMD AT 0715 ON [id number] BY SJW    Methicillin resistance DETECTED (A) NOT DETECTED Final    Comment: CRITICAL RESULT CALLED TO, READ BACK BY AND  VERIFIED WITH: VERANDA BYRK PHARMD  AT 0715 ON [id number] BY SJW    Streptococcus species NOT DETECTED NOT DETECTED Final   Streptococcus agalactiae NOT DETECTED NOT DETECTED Final   Streptococcus pneumoniae NOT DETECTED NOT DETECTED Final   Streptococcus pyogenes NOT DETECTED NOT DETECTED Final   Acinetobacter baumannii NOT DETECTED NOT DETECTED Final   Enterobacteriaceae species NOT DETECTED NOT DETECTED Final   Enterobacter cloacae complex NOT DETECTED NOT DETECTED Final   Escherichia coli NOT DETECTED NOT DETECTED Final   Klebsiella oxytoca NOT DETECTED NOT DETECTED Final   Klebsiella pneumoniae NOT DETECTED NOT DETECTED Final   Proteus species NOT DETECTED NOT DETECTED Final   Serratia marcescens NOT DETECTED NOT DETECTED Final   Haemophilus influenzae NOT DETECTED NOT DETECTED Final   Neisseria meningitidis NOT DETECTED NOT DETECTED Final   Pseudomonas aeruginosa NOT DETECTED NOT DETECTED Final   Candida albicans NOT DETECTED NOT DETECTED Final   Candida glabrata NOT DETECTED NOT DETECTED Final   Candida krusei NOT DETECTED NOT DETECTED Final   Candida parapsilosis NOT DETECTED NOT DETECTED Final   Candida tropicalis NOT DETECTED NOT DETECTED Final    Comment: Performed at [facility], [place] [address] [place], [place] [address]  Urine culture     Status: None   Collection Time: 04/19/17  5:01 PM    Result Value Ref Range Status   Specimen Description URINE, CATHETERIZED  Final   Special Requests Normal  Final   Culture   Final    NO GROWTH Performed at [facility], [address] [place], [place] [address]    Report Status 04/20/2017 FINAL  Final  Blood Culture (routine x 2)     Status: Abnormal   Collection Time: 04/19/17  5:08 PM  Result Value Ref Range Status   Specimen Description BLOOD LEFT ANTECUBITAL  Final   Special Requests   Final    BOTTLES DRAWN AEROBIC AND ANAEROBIC Blood Culture adequate volume   Culture  Setup Time   Final    GRAM POSITIVE COCCI IN BOTH AEROBIC AND ANAEROBIC BOTTLES CRITICAL VALUE NOTED.  VALUE IS CONSISTENT WITH PREVIOUSLY REPORTED AND CALLED VALUE.    Culture (A)  Final    STAPHYLOCOCCUS AUREUS SUSCEPTIBILITIES PERFORMED ON PREVIOUS CULTURE WITHIN THE LAST 5 DAYS. Performed at [facility], [place] [address] [place], [place] [address]    Report Status 04/22/2017 FINAL  Final  Culture, blood (routine x 2)     Status: None   Collection Time: 04/21/17 12:58 PM  Result Value Ref Range Status   Specimen Description BLOOD RIGHT ANTECUBITAL  Final   Special Requests AEROBIC BOTTLE ONLY Blood Culture adequate volume  Final   Culture   Final    NO GROWTH 5 DAYS Performed at [facility], [place] [address], [place], [place] [address]    Report Status 04/26/2017 FINAL  Final  Culture, blood (routine x 2)     Status: None   Collection Time: 04/21/17  1:00 PM  Result Value Ref Range Status   Specimen Description BLOOD RIGHT ANTECUBITAL  Final   Special Requests AEROBIC BOTTLE ONLY Blood Culture adequate volume  Final   Culture   Final    NO GROWTH 5 DAYS Performed at [facility], [place] [address], [place], [place] [address]    Report Status 04/26/2017 FINAL  Final  Aerobic Culture (superficial specimen)     Status: None   Collection Time: 04/24/17  3:44 PM  Result Value Ref Range Status   Specimen Description ABSCESS  VULVA  Final   Special Requests NONE  Final   Gram Stain   Final    ABUNDANT WBC PRESENT,BOTH PMN AND MONONUCLEAR RARE GRAM POSITIVE COCCI Performed at [facility], [place] [address] [place], [place] [address]    Culture   Final    MODERATE METHICILLIN RESISTANT STAPHYLOCOCCUS AUREUS   Report Status 04/26/2017 FINAL  Final   Organism ID, Bacteria METHICILLIN RESISTANT STAPHYLOCOCCUS AUREUS  Final      Susceptibility   Methicillin resistant staphylococcus aureus - MIC*    CIPROFLOXACIN >=8 RESISTANT Resistant     ERYTHROMYCIN >=8 RESISTANT Resistant     GENTAMICIN <=0.5 SENSITIVE Sensitive     OXACILLIN >=4 RESISTANT Resistant     TETRACYCLINE <=1 SENSITIVE Sensitive     VANCOMYCIN 1 SENSITIVE Sensitive     TRIMETH/SULFA <=10 SENSITIVE Sensitive     CLINDAMYCIN <=0.25 SENSITIVE Sensitive     RIFAMPIN <=0.5 SENSITIVE Sensitive     Inducible Clindamycin NEGATIVE Sensitive     * MODERATE METHICILLIN RESISTANT STAPHYLOCOCCUS AUREUS     Labs: Basic Metabolic Panel: Recent Labs  Lab 04/21/17 0308 04/21/17 0800 04/22/17 0248 04/24/17 0426 04/25/17 0521 04/26/17 0300  NA 136  --  134* 145 135 136  K 3.4*  --  3.6 3.3* 3.5 3.0*  CL 103  --  105 106 103 101  CO2 24  --  '22 29 23 24  '$ GLUCOSE 103*  --  107* 112* 107* 95  BUN 13  --  '9 6 9 7  '$ CREATININE 0.83  --  0.71 0.68 0.70 0.73  CALCIUM 8.2*  --  8.2* 8.6* 8.7* 8.4*  MG  --  1.8  --   --   --   --    Liver Function Tests: Recent Labs  Lab 04/19/17 1655  AST 23  ALT 10*  ALKPHOS 106  BILITOT 1.1  PROT 7.7  ALBUMIN 3.1*   No results for input(s): LIPASE, AMYLASE in the last 168 hours. Recent Labs  Lab 04/19/17 2020  AMMONIA 24   CBC: Recent Labs  Lab 04/19/17 1655 04/21/17 0308 04/22/17 0647 04/25/17 0521 04/26/17 0300  WBC 14.9* 13.1* 11.2* 13.6* 13.1*  NEUTROABS 12.4*  --   --   --   --   HGB 10.1* 8.1* 8.8* 9.2* 8.3*  HCT 32.6* 26.4* 28.3* 29.3* 26.0*  MCV 82.1 81.2 80.9 79.2 79.0  PLT 417* 353  379 493* 460*   Cardiac Enzymes: Recent Labs  Lab 04/19/17 1655  TROPONINI <0.03   BNP: BNP (last 3 results) Recent Labs    04/19/17 1655  BNP 322.8*    ProBNP (last 3 results) No results for input(s): PROBNP in the last 8760 hours.  CBG: Recent Labs  Lab 04/21/17 1219 04/21/17 1626 04/21/17 2102 04/22/17 0752 04/22/17 1216  GLUCAP 123* 114* 159* 106* 87       Signed:  Annita Brod, MD [facility] 04/26/2017, 12:45 PM

## 2017-04-26 NOTE — Final Consult Note (Signed)
[facility] Progress Note  3 Days Post-Op  Subjective: CC: neck pain Main complaint currently is neck pain, feels like she can't get comfortable.  Patient reports improvement in vulvar pain. Packing came out when she went to the restroom this morning. Patient reports her sister is going to be helping with dressing changes.   Objective: Vital signs in last 24 hours: Temp:  [98.2 F (36.8 C)-99 F (37.2 C)] 99 F (37.2 C) (04/26 0750) Pulse Rate:  [83-112] 112 (04/26 0750) Resp:  [18] 18 (04/26 0750) BP: (147-157)/(76-84) 147/76 (04/26 0750) SpO2:  [93 %-97 %] 97 % (04/26 0750) Last BM Date: 04/25/17  Intake/Output from previous day: 04/25 0701 - 04/26 0700 In: 270 [P.O.:120; IV Piggyback:150] Out: 350 [Urine:350] Intake/Output this shift: No intake/output data recorded.  PE: Gen:  Alert, NAD, pleasant Card:  Regular rate and rhythm, pedal pulses 2+ BL Pulm:  Normal effort GU: Left labial wound tracks 6 cm superiorly, small amount purulent drainage, minimal surrounding erythema/induration   Psych: A&Ox3   Lab Results:  Recent Labs    04/25/17 0521 04/26/17 0300  WBC 13.6* 13.1*  HGB 9.2* 8.3*  HCT 29.3* 26.0*  PLT 493* 460*   BMET Recent Labs    04/25/17 0521 04/26/17 0300  NA 135 136  K 3.5 3.0*  CL 103 101  CO2 23 24  GLUCOSE 107* 95  BUN 9 7  CREATININE 0.70 0.73  CALCIUM 8.7* 8.4*   PT/INR No results for input(s): LABPROT, INR in the last 72 hours. CMP     Component Value Date/Time   NA 136 04/26/2017 0300   K 3.0 (L) 04/26/2017 0300   CL 101 04/26/2017 0300   CO2 24 04/26/2017 0300   GLUCOSE 95 04/26/2017 0300   BUN 7 04/26/2017 0300   CREATININE 0.73 04/26/2017 0300   CALCIUM 8.4 (L) 04/26/2017 0300   PROT 7.7 04/19/2017 1655   ALBUMIN 3.1 (L) 04/19/2017 1655   AST 23 04/19/2017 1655   ALT 10 (L) 04/19/2017 1655   ALKPHOS 106 04/19/2017 1655   BILITOT 1.1 04/19/2017 1655   GFRNONAA >60 04/26/2017 0300   GFRAA >60  04/26/2017 0300   Lipase  No results found for: LIPASE     Studies/Results: [place] Ekg Site Rite  Result Date: 04/25/2017 If Site Rite image not attached, placement could not be confirmed due to current cardiac rhythm.   Anti-infectives: Anti-infectives (From admission, onward)   Start     Dose/Rate Route Frequency Ordered Stop   04/20/17 0800  vancomycin (VANCOCIN) IVPB 750 mg/150 ml premix     750 mg 150 mL/hr over 60 Minutes Intravenous Every 12 hours 04/20/17 0732     04/20/17 0530  vancomycin (VANCOCIN) IVPB 750 mg/150 ml premix  Status:  Discontinued     750 mg 150 mL/hr over 60 Minutes Intravenous Every 12 hours 04/19/17 2040 04/19/17 2241   04/20/17 0100  piperacillin-tazobactam (ZOSYN) IVPB 3.375 g  Status:  Discontinued     3.375 g 12.5 mL/hr over 240 Minutes Intravenous Every 8 hours 04/19/17 2040 04/19/17 2241   04/19/17 2330  levofloxacin (LEVAQUIN) IVPB 500 mg  Status:  Discontinued     500 mg 100 mL/hr over 60 Minutes Intravenous Every 24 hours 04/19/17 2259 04/20/17 0721   04/19/17 1715  piperacillin-tazobactam (ZOSYN) IVPB 3.375 g     3.375 g 100 mL/hr over 30 Minutes Intravenous  Once 04/19/17 1701 04/19/17 1745   04/19/17 1715  vancomycin (VANCOCIN) IVPB 1000 mg/200  mL premix  Status:  Discontinued     1,000 mg 200 mL/hr over 60 Minutes Intravenous  Once 04/19/17 1701 04/19/17 1704   04/19/17 1715  vancomycin (VANCOCIN) 1,750 mg in sodium chloride 0.9 % 500 mL IVPB     1,750 mg 250 mL/hr over 120 Minutes Intravenous  Once 04/19/17 1704 04/19/17 1928       Assessment/Plan Left labial abscess - s/p bedside I&D 4/24 - cx with moderate staph aureus - continue abx - continue daily dressing changes with iodoform packing - repack if packing comes out or is soiled with toileting - sitz baths - we will sign off, please call with questions or concerns  LOS: 7 days    Brigid Re , [facility] 04/26/2017, 8:26 AM Pager:  [phone] Consults: [phone] Mon-Fri 7:00 am-4:30 pm Sat-Sun 7:00 am-11:30 am

## 2017-04-26 NOTE — Consult Note (Signed)
[facility] CM Primary Care Navigator  04/26/2017  SAHIBA GRANHOLM [birth year] [id number]   Met with patient, son Gwenlyn Perking) and daughter in-law Barnett Applebaum) at the bedside toidentify possible discharge needs. Sonreportsthat patient hadshortness of breath, fever/chills and altered mental status that had ledtothis admission.(Aortic valve endocarditis, MRSA bacteremia, labial boil)  PatientendorsesDr.John Micheal Likens with [place] [facility] as herprimary care provider.   Newborn on [place] toobtain medications without difficulty.  Patientreports that sister Stanton Kidney) is managinghermedications at [employer] use of "pill box" system filled once a week.  Patient statesthatsister (lives nearby) has been providingtransportationto her doctors'appointments.  Patientreportslivingalone but sister will serve as her primary caregiver at home. Her son and daughter in-law will provide assistance if needed, as stated.  Anticipated plan for discharge ishome with home health services per son.  Patient and son voiced understandingto callprimarycareprovider'soffice whenshereturns home,for a post discharge follow-upvisitwithin1- 2 weeksor sooner if needs arise.Patient letter (with PCP's contact number) was provided asareminder.   Discussed with patient and sonregarding [facility] CM services available for healthmanagementat homeand states that she "does not feel the need for services right now". Patient reports that home health staff will be coming over to her house after discharge to assist in managing her health needs. Patientverbalizedunderstandingof needto seekreferral from primary care provider to [facility] care management ifdeemed necessary and appropriatefor anyservicesin the nearfuture.   [facility] care management information was provided for futureneedsthatshemay have.  Patienthowever, would only opt  and verbally agree forEMMI callstofollow-up withherrecovery.  Referral made for [facility] General calls after discharge.  Primary care provider's office is listed as providing transition of care (TOC) follow-up.   For additional questions please contact:  Edwena Felty A. Gilberto Stanforth, BSN, RN-BC [facility] PRIMARY CARE Navigator Cell: [phone]

## 2017-04-27 DIAGNOSIS — K219 Gastro-esophageal reflux disease without esophagitis: Secondary | ICD-10-CM | POA: Diagnosis not present

## 2017-04-27 DIAGNOSIS — A4102 Sepsis due to Methicillin resistant Staphylococcus aureus: Secondary | ICD-10-CM | POA: Diagnosis not present

## 2017-04-29 DIAGNOSIS — I358 Other nonrheumatic aortic valve disorders: Secondary | ICD-10-CM | POA: Diagnosis not present

## 2017-04-29 DIAGNOSIS — Z96653 Presence of artificial knee joint, bilateral: Secondary | ICD-10-CM | POA: Diagnosis not present

## 2017-04-29 DIAGNOSIS — I480 Paroxysmal atrial fibrillation: Secondary | ICD-10-CM | POA: Diagnosis not present

## 2017-04-29 DIAGNOSIS — Z87891 Personal history of nicotine dependence: Secondary | ICD-10-CM | POA: Diagnosis not present

## 2017-04-29 DIAGNOSIS — I50810 Right heart failure, unspecified: Secondary | ICD-10-CM | POA: Diagnosis not present

## 2017-04-29 DIAGNOSIS — K219 Gastro-esophageal reflux disease without esophagitis: Secondary | ICD-10-CM | POA: Diagnosis not present

## 2017-04-29 DIAGNOSIS — J9611 Chronic respiratory failure with hypoxia: Secondary | ICD-10-CM | POA: Diagnosis not present

## 2017-04-29 DIAGNOSIS — Z452 Encounter for adjustment and management of vascular access device: Secondary | ICD-10-CM | POA: Diagnosis not present

## 2017-04-29 DIAGNOSIS — Z792 Long term (current) use of antibiotics: Secondary | ICD-10-CM | POA: Diagnosis not present

## 2017-04-29 DIAGNOSIS — M069 Rheumatoid arthritis, unspecified: Secondary | ICD-10-CM | POA: Diagnosis not present

## 2017-04-29 DIAGNOSIS — I110 Hypertensive heart disease with heart failure: Secondary | ICD-10-CM | POA: Diagnosis not present

## 2017-04-29 DIAGNOSIS — R1310 Dysphagia, unspecified: Secondary | ICD-10-CM | POA: Diagnosis not present

## 2017-04-29 DIAGNOSIS — A4102 Sepsis due to Methicillin resistant Staphylococcus aureus: Secondary | ICD-10-CM | POA: Diagnosis not present

## 2017-04-29 DIAGNOSIS — D509 Iron deficiency anemia, unspecified: Secondary | ICD-10-CM | POA: Diagnosis not present

## 2017-04-29 DIAGNOSIS — I5032 Chronic diastolic (congestive) heart failure: Secondary | ICD-10-CM | POA: Diagnosis not present

## 2017-04-30 ENCOUNTER — Encounter: Payer: Self-pay | Admitting: Infectious Disease

## 2017-04-30 DIAGNOSIS — A4102 Sepsis due to Methicillin resistant Staphylococcus aureus: Secondary | ICD-10-CM | POA: Diagnosis not present

## 2017-04-30 DIAGNOSIS — I480 Paroxysmal atrial fibrillation: Secondary | ICD-10-CM | POA: Diagnosis not present

## 2017-04-30 DIAGNOSIS — K219 Gastro-esophageal reflux disease without esophagitis: Secondary | ICD-10-CM | POA: Diagnosis not present

## 2017-04-30 DIAGNOSIS — Z452 Encounter for adjustment and management of vascular access device: Secondary | ICD-10-CM | POA: Diagnosis not present

## 2017-04-30 DIAGNOSIS — D509 Iron deficiency anemia, unspecified: Secondary | ICD-10-CM | POA: Diagnosis not present

## 2017-04-30 DIAGNOSIS — R1310 Dysphagia, unspecified: Secondary | ICD-10-CM | POA: Diagnosis not present

## 2017-04-30 DIAGNOSIS — I5032 Chronic diastolic (congestive) heart failure: Secondary | ICD-10-CM | POA: Diagnosis not present

## 2017-04-30 DIAGNOSIS — Z96653 Presence of artificial knee joint, bilateral: Secondary | ICD-10-CM | POA: Diagnosis not present

## 2017-04-30 DIAGNOSIS — I50810 Right heart failure, unspecified: Secondary | ICD-10-CM | POA: Diagnosis not present

## 2017-04-30 DIAGNOSIS — J9611 Chronic respiratory failure with hypoxia: Secondary | ICD-10-CM | POA: Diagnosis not present

## 2017-04-30 DIAGNOSIS — M069 Rheumatoid arthritis, unspecified: Secondary | ICD-10-CM | POA: Diagnosis not present

## 2017-04-30 DIAGNOSIS — Z87891 Personal history of nicotine dependence: Secondary | ICD-10-CM | POA: Diagnosis not present

## 2017-04-30 DIAGNOSIS — I358 Other nonrheumatic aortic valve disorders: Secondary | ICD-10-CM | POA: Diagnosis not present

## 2017-04-30 DIAGNOSIS — Z5181 Encounter for therapeutic drug level monitoring: Secondary | ICD-10-CM | POA: Diagnosis not present

## 2017-04-30 DIAGNOSIS — Z792 Long term (current) use of antibiotics: Secondary | ICD-10-CM | POA: Diagnosis not present

## 2017-04-30 DIAGNOSIS — I110 Hypertensive heart disease with heart failure: Secondary | ICD-10-CM | POA: Diagnosis not present

## 2017-05-02 ENCOUNTER — Encounter: Payer: Self-pay | Admitting: Infectious Disease

## 2017-05-02 ENCOUNTER — Other Ambulatory Visit: Payer: Self-pay | Admitting: Pharmacist

## 2017-05-02 DIAGNOSIS — Z792 Long term (current) use of antibiotics: Secondary | ICD-10-CM | POA: Diagnosis not present

## 2017-05-02 DIAGNOSIS — I50810 Right heart failure, unspecified: Secondary | ICD-10-CM | POA: Diagnosis not present

## 2017-05-02 DIAGNOSIS — K219 Gastro-esophageal reflux disease without esophagitis: Secondary | ICD-10-CM | POA: Diagnosis not present

## 2017-05-02 DIAGNOSIS — I110 Hypertensive heart disease with heart failure: Secondary | ICD-10-CM | POA: Diagnosis not present

## 2017-05-02 DIAGNOSIS — M069 Rheumatoid arthritis, unspecified: Secondary | ICD-10-CM | POA: Diagnosis not present

## 2017-05-02 DIAGNOSIS — A4102 Sepsis due to Methicillin resistant Staphylococcus aureus: Secondary | ICD-10-CM | POA: Diagnosis not present

## 2017-05-02 DIAGNOSIS — I5032 Chronic diastolic (congestive) heart failure: Secondary | ICD-10-CM | POA: Diagnosis not present

## 2017-05-02 DIAGNOSIS — Z96653 Presence of artificial knee joint, bilateral: Secondary | ICD-10-CM | POA: Diagnosis not present

## 2017-05-02 DIAGNOSIS — Z87891 Personal history of nicotine dependence: Secondary | ICD-10-CM | POA: Diagnosis not present

## 2017-05-02 DIAGNOSIS — J9611 Chronic respiratory failure with hypoxia: Secondary | ICD-10-CM | POA: Diagnosis not present

## 2017-05-02 DIAGNOSIS — I480 Paroxysmal atrial fibrillation: Secondary | ICD-10-CM | POA: Diagnosis not present

## 2017-05-02 DIAGNOSIS — R1310 Dysphagia, unspecified: Secondary | ICD-10-CM | POA: Diagnosis not present

## 2017-05-02 DIAGNOSIS — D509 Iron deficiency anemia, unspecified: Secondary | ICD-10-CM | POA: Diagnosis not present

## 2017-05-02 DIAGNOSIS — Z452 Encounter for adjustment and management of vascular access device: Secondary | ICD-10-CM | POA: Diagnosis not present

## 2017-05-02 DIAGNOSIS — Z5181 Encounter for therapeutic drug level monitoring: Secondary | ICD-10-CM | POA: Diagnosis not present

## 2017-05-02 DIAGNOSIS — I358 Other nonrheumatic aortic valve disorders: Secondary | ICD-10-CM | POA: Diagnosis not present

## 2017-05-03 DIAGNOSIS — I358 Other nonrheumatic aortic valve disorders: Secondary | ICD-10-CM | POA: Diagnosis not present

## 2017-05-03 DIAGNOSIS — M069 Rheumatoid arthritis, unspecified: Secondary | ICD-10-CM | POA: Diagnosis not present

## 2017-05-03 DIAGNOSIS — K219 Gastro-esophageal reflux disease without esophagitis: Secondary | ICD-10-CM | POA: Diagnosis not present

## 2017-05-03 DIAGNOSIS — Z87891 Personal history of nicotine dependence: Secondary | ICD-10-CM | POA: Diagnosis not present

## 2017-05-03 DIAGNOSIS — Z452 Encounter for adjustment and management of vascular access device: Secondary | ICD-10-CM | POA: Diagnosis not present

## 2017-05-03 DIAGNOSIS — I5032 Chronic diastolic (congestive) heart failure: Secondary | ICD-10-CM | POA: Diagnosis not present

## 2017-05-03 DIAGNOSIS — R1310 Dysphagia, unspecified: Secondary | ICD-10-CM | POA: Diagnosis not present

## 2017-05-03 DIAGNOSIS — D509 Iron deficiency anemia, unspecified: Secondary | ICD-10-CM | POA: Diagnosis not present

## 2017-05-03 DIAGNOSIS — I110 Hypertensive heart disease with heart failure: Secondary | ICD-10-CM | POA: Diagnosis not present

## 2017-05-03 DIAGNOSIS — Z96653 Presence of artificial knee joint, bilateral: Secondary | ICD-10-CM | POA: Diagnosis not present

## 2017-05-03 DIAGNOSIS — A4102 Sepsis due to Methicillin resistant Staphylococcus aureus: Secondary | ICD-10-CM | POA: Diagnosis not present

## 2017-05-03 DIAGNOSIS — Z792 Long term (current) use of antibiotics: Secondary | ICD-10-CM | POA: Diagnosis not present

## 2017-05-03 DIAGNOSIS — J9611 Chronic respiratory failure with hypoxia: Secondary | ICD-10-CM | POA: Diagnosis not present

## 2017-05-03 DIAGNOSIS — I50810 Right heart failure, unspecified: Secondary | ICD-10-CM | POA: Diagnosis not present

## 2017-05-03 DIAGNOSIS — I480 Paroxysmal atrial fibrillation: Secondary | ICD-10-CM | POA: Diagnosis not present

## 2017-05-04 DIAGNOSIS — L989 Disorder of the skin and subcutaneous tissue, unspecified: Secondary | ICD-10-CM | POA: Diagnosis not present

## 2017-05-04 DIAGNOSIS — I38 Endocarditis, valve unspecified: Secondary | ICD-10-CM | POA: Diagnosis not present

## 2017-05-04 DIAGNOSIS — B9562 Methicillin resistant Staphylococcus aureus infection as the cause of diseases classified elsewhere: Secondary | ICD-10-CM | POA: Diagnosis not present

## 2017-05-06 ENCOUNTER — Encounter: Payer: Self-pay | Admitting: Infectious Diseases

## 2017-05-06 DIAGNOSIS — I50810 Right heart failure, unspecified: Secondary | ICD-10-CM | POA: Diagnosis not present

## 2017-05-06 DIAGNOSIS — I480 Paroxysmal atrial fibrillation: Secondary | ICD-10-CM | POA: Diagnosis not present

## 2017-05-06 DIAGNOSIS — Z79899 Other long term (current) drug therapy: Secondary | ICD-10-CM | POA: Diagnosis not present

## 2017-05-06 DIAGNOSIS — R1310 Dysphagia, unspecified: Secondary | ICD-10-CM | POA: Diagnosis not present

## 2017-05-06 DIAGNOSIS — I10 Essential (primary) hypertension: Secondary | ICD-10-CM | POA: Diagnosis not present

## 2017-05-06 DIAGNOSIS — A4102 Sepsis due to Methicillin resistant Staphylococcus aureus: Secondary | ICD-10-CM | POA: Diagnosis not present

## 2017-05-06 DIAGNOSIS — Z5181 Encounter for therapeutic drug level monitoring: Secondary | ICD-10-CM | POA: Diagnosis not present

## 2017-05-06 DIAGNOSIS — E0590 Thyrotoxicosis, unspecified without thyrotoxic crisis or storm: Secondary | ICD-10-CM | POA: Diagnosis not present

## 2017-05-06 DIAGNOSIS — K210 Gastro-esophageal reflux disease with esophagitis: Secondary | ICD-10-CM | POA: Diagnosis not present

## 2017-05-06 DIAGNOSIS — M069 Rheumatoid arthritis, unspecified: Secondary | ICD-10-CM | POA: Diagnosis not present

## 2017-05-06 DIAGNOSIS — D509 Iron deficiency anemia, unspecified: Secondary | ICD-10-CM | POA: Diagnosis not present

## 2017-05-06 DIAGNOSIS — I110 Hypertensive heart disease with heart failure: Secondary | ICD-10-CM | POA: Diagnosis not present

## 2017-05-06 DIAGNOSIS — Z96653 Presence of artificial knee joint, bilateral: Secondary | ICD-10-CM | POA: Diagnosis not present

## 2017-05-06 DIAGNOSIS — J9611 Chronic respiratory failure with hypoxia: Secondary | ICD-10-CM | POA: Diagnosis not present

## 2017-05-06 DIAGNOSIS — Z792 Long term (current) use of antibiotics: Secondary | ICD-10-CM | POA: Diagnosis not present

## 2017-05-06 DIAGNOSIS — I358 Other nonrheumatic aortic valve disorders: Secondary | ICD-10-CM | POA: Diagnosis not present

## 2017-05-06 DIAGNOSIS — K219 Gastro-esophageal reflux disease without esophagitis: Secondary | ICD-10-CM | POA: Diagnosis not present

## 2017-05-06 DIAGNOSIS — Z87891 Personal history of nicotine dependence: Secondary | ICD-10-CM | POA: Diagnosis not present

## 2017-05-06 DIAGNOSIS — Z452 Encounter for adjustment and management of vascular access device: Secondary | ICD-10-CM | POA: Diagnosis not present

## 2017-05-06 DIAGNOSIS — I5032 Chronic diastolic (congestive) heart failure: Secondary | ICD-10-CM | POA: Diagnosis not present

## 2017-05-07 DIAGNOSIS — Z96653 Presence of artificial knee joint, bilateral: Secondary | ICD-10-CM | POA: Diagnosis not present

## 2017-05-07 DIAGNOSIS — Z792 Long term (current) use of antibiotics: Secondary | ICD-10-CM | POA: Diagnosis not present

## 2017-05-07 DIAGNOSIS — D509 Iron deficiency anemia, unspecified: Secondary | ICD-10-CM | POA: Diagnosis not present

## 2017-05-07 DIAGNOSIS — I5032 Chronic diastolic (congestive) heart failure: Secondary | ICD-10-CM | POA: Diagnosis not present

## 2017-05-07 DIAGNOSIS — I50810 Right heart failure, unspecified: Secondary | ICD-10-CM | POA: Diagnosis not present

## 2017-05-07 DIAGNOSIS — A4102 Sepsis due to Methicillin resistant Staphylococcus aureus: Secondary | ICD-10-CM | POA: Diagnosis not present

## 2017-05-07 DIAGNOSIS — R1310 Dysphagia, unspecified: Secondary | ICD-10-CM | POA: Diagnosis not present

## 2017-05-07 DIAGNOSIS — M069 Rheumatoid arthritis, unspecified: Secondary | ICD-10-CM | POA: Diagnosis not present

## 2017-05-07 DIAGNOSIS — Z87891 Personal history of nicotine dependence: Secondary | ICD-10-CM | POA: Diagnosis not present

## 2017-05-07 DIAGNOSIS — K219 Gastro-esophageal reflux disease without esophagitis: Secondary | ICD-10-CM | POA: Diagnosis not present

## 2017-05-07 DIAGNOSIS — I358 Other nonrheumatic aortic valve disorders: Secondary | ICD-10-CM | POA: Diagnosis not present

## 2017-05-07 DIAGNOSIS — J9611 Chronic respiratory failure with hypoxia: Secondary | ICD-10-CM | POA: Diagnosis not present

## 2017-05-07 DIAGNOSIS — I110 Hypertensive heart disease with heart failure: Secondary | ICD-10-CM | POA: Diagnosis not present

## 2017-05-07 DIAGNOSIS — I480 Paroxysmal atrial fibrillation: Secondary | ICD-10-CM | POA: Diagnosis not present

## 2017-05-07 DIAGNOSIS — Z452 Encounter for adjustment and management of vascular access device: Secondary | ICD-10-CM | POA: Diagnosis not present

## 2017-05-08 DIAGNOSIS — M150 Primary generalized (osteo)arthritis: Secondary | ICD-10-CM | POA: Diagnosis not present

## 2017-05-08 DIAGNOSIS — R5383 Other fatigue: Secondary | ICD-10-CM | POA: Diagnosis not present

## 2017-05-08 DIAGNOSIS — M0579 Rheumatoid arthritis with rheumatoid factor of multiple sites without organ or systems involvement: Secondary | ICD-10-CM | POA: Diagnosis not present

## 2017-05-08 DIAGNOSIS — I330 Acute and subacute infective endocarditis: Secondary | ICD-10-CM | POA: Diagnosis not present

## 2017-05-08 DIAGNOSIS — M7989 Other specified soft tissue disorders: Secondary | ICD-10-CM | POA: Diagnosis not present

## 2017-05-09 DIAGNOSIS — A4102 Sepsis due to Methicillin resistant Staphylococcus aureus: Secondary | ICD-10-CM | POA: Diagnosis not present

## 2017-05-09 DIAGNOSIS — M069 Rheumatoid arthritis, unspecified: Secondary | ICD-10-CM | POA: Diagnosis not present

## 2017-05-09 DIAGNOSIS — Z792 Long term (current) use of antibiotics: Secondary | ICD-10-CM | POA: Diagnosis not present

## 2017-05-09 DIAGNOSIS — K219 Gastro-esophageal reflux disease without esophagitis: Secondary | ICD-10-CM | POA: Diagnosis not present

## 2017-05-09 DIAGNOSIS — Z5181 Encounter for therapeutic drug level monitoring: Secondary | ICD-10-CM | POA: Diagnosis not present

## 2017-05-09 DIAGNOSIS — D509 Iron deficiency anemia, unspecified: Secondary | ICD-10-CM | POA: Diagnosis not present

## 2017-05-09 DIAGNOSIS — J9611 Chronic respiratory failure with hypoxia: Secondary | ICD-10-CM | POA: Diagnosis not present

## 2017-05-09 DIAGNOSIS — I110 Hypertensive heart disease with heart failure: Secondary | ICD-10-CM | POA: Diagnosis not present

## 2017-05-09 DIAGNOSIS — I5032 Chronic diastolic (congestive) heart failure: Secondary | ICD-10-CM | POA: Diagnosis not present

## 2017-05-09 DIAGNOSIS — I358 Other nonrheumatic aortic valve disorders: Secondary | ICD-10-CM | POA: Diagnosis not present

## 2017-05-09 DIAGNOSIS — Z452 Encounter for adjustment and management of vascular access device: Secondary | ICD-10-CM | POA: Diagnosis not present

## 2017-05-09 DIAGNOSIS — I480 Paroxysmal atrial fibrillation: Secondary | ICD-10-CM | POA: Diagnosis not present

## 2017-05-09 DIAGNOSIS — Z87891 Personal history of nicotine dependence: Secondary | ICD-10-CM | POA: Diagnosis not present

## 2017-05-09 DIAGNOSIS — I50810 Right heart failure, unspecified: Secondary | ICD-10-CM | POA: Diagnosis not present

## 2017-05-09 DIAGNOSIS — R1310 Dysphagia, unspecified: Secondary | ICD-10-CM | POA: Diagnosis not present

## 2017-05-09 DIAGNOSIS — Z96653 Presence of artificial knee joint, bilateral: Secondary | ICD-10-CM | POA: Diagnosis not present

## 2017-05-10 ENCOUNTER — Other Ambulatory Visit: Payer: Self-pay | Admitting: Pharmacist

## 2017-05-10 DIAGNOSIS — I480 Paroxysmal atrial fibrillation: Secondary | ICD-10-CM | POA: Diagnosis not present

## 2017-05-10 DIAGNOSIS — M069 Rheumatoid arthritis, unspecified: Secondary | ICD-10-CM | POA: Diagnosis not present

## 2017-05-10 DIAGNOSIS — R1310 Dysphagia, unspecified: Secondary | ICD-10-CM | POA: Diagnosis not present

## 2017-05-10 DIAGNOSIS — I358 Other nonrheumatic aortic valve disorders: Secondary | ICD-10-CM | POA: Diagnosis not present

## 2017-05-10 DIAGNOSIS — D509 Iron deficiency anemia, unspecified: Secondary | ICD-10-CM | POA: Diagnosis not present

## 2017-05-10 DIAGNOSIS — Z792 Long term (current) use of antibiotics: Secondary | ICD-10-CM | POA: Diagnosis not present

## 2017-05-10 DIAGNOSIS — Z87891 Personal history of nicotine dependence: Secondary | ICD-10-CM | POA: Diagnosis not present

## 2017-05-10 DIAGNOSIS — I50810 Right heart failure, unspecified: Secondary | ICD-10-CM | POA: Diagnosis not present

## 2017-05-10 DIAGNOSIS — I5032 Chronic diastolic (congestive) heart failure: Secondary | ICD-10-CM | POA: Diagnosis not present

## 2017-05-10 DIAGNOSIS — J9611 Chronic respiratory failure with hypoxia: Secondary | ICD-10-CM | POA: Diagnosis not present

## 2017-05-10 DIAGNOSIS — Z452 Encounter for adjustment and management of vascular access device: Secondary | ICD-10-CM | POA: Diagnosis not present

## 2017-05-10 DIAGNOSIS — K219 Gastro-esophageal reflux disease without esophagitis: Secondary | ICD-10-CM | POA: Diagnosis not present

## 2017-05-10 DIAGNOSIS — I110 Hypertensive heart disease with heart failure: Secondary | ICD-10-CM | POA: Diagnosis not present

## 2017-05-10 DIAGNOSIS — Z96653 Presence of artificial knee joint, bilateral: Secondary | ICD-10-CM | POA: Diagnosis not present

## 2017-05-10 DIAGNOSIS — A4102 Sepsis due to Methicillin resistant Staphylococcus aureus: Secondary | ICD-10-CM | POA: Diagnosis not present

## 2017-05-10 NOTE — Progress Notes (Signed)
[facility] lab monitoring 

## 2017-05-12 DIAGNOSIS — I38 Endocarditis, valve unspecified: Secondary | ICD-10-CM | POA: Diagnosis not present

## 2017-05-12 DIAGNOSIS — L989 Disorder of the skin and subcutaneous tissue, unspecified: Secondary | ICD-10-CM | POA: Diagnosis not present

## 2017-05-12 DIAGNOSIS — B9562 Methicillin resistant Staphylococcus aureus infection as the cause of diseases classified elsewhere: Secondary | ICD-10-CM | POA: Diagnosis not present

## 2017-05-14 ENCOUNTER — Encounter: Payer: Self-pay | Admitting: Infectious Diseases

## 2017-05-14 DIAGNOSIS — E785 Hyperlipidemia, unspecified: Secondary | ICD-10-CM | POA: Diagnosis not present

## 2017-05-14 DIAGNOSIS — Z96653 Presence of artificial knee joint, bilateral: Secondary | ICD-10-CM | POA: Diagnosis not present

## 2017-05-14 DIAGNOSIS — Z5181 Encounter for therapeutic drug level monitoring: Secondary | ICD-10-CM | POA: Diagnosis not present

## 2017-05-14 DIAGNOSIS — Z87891 Personal history of nicotine dependence: Secondary | ICD-10-CM | POA: Diagnosis not present

## 2017-05-14 DIAGNOSIS — R1310 Dysphagia, unspecified: Secondary | ICD-10-CM | POA: Diagnosis not present

## 2017-05-14 DIAGNOSIS — Z452 Encounter for adjustment and management of vascular access device: Secondary | ICD-10-CM | POA: Diagnosis not present

## 2017-05-14 DIAGNOSIS — I50810 Right heart failure, unspecified: Secondary | ICD-10-CM | POA: Diagnosis not present

## 2017-05-14 DIAGNOSIS — M069 Rheumatoid arthritis, unspecified: Secondary | ICD-10-CM | POA: Diagnosis not present

## 2017-05-14 DIAGNOSIS — Z792 Long term (current) use of antibiotics: Secondary | ICD-10-CM | POA: Diagnosis not present

## 2017-05-14 DIAGNOSIS — I358 Other nonrheumatic aortic valve disorders: Secondary | ICD-10-CM | POA: Diagnosis not present

## 2017-05-14 DIAGNOSIS — D500 Iron deficiency anemia secondary to blood loss (chronic): Secondary | ICD-10-CM | POA: Diagnosis not present

## 2017-05-14 DIAGNOSIS — K219 Gastro-esophageal reflux disease without esophagitis: Secondary | ICD-10-CM | POA: Diagnosis not present

## 2017-05-14 DIAGNOSIS — D509 Iron deficiency anemia, unspecified: Secondary | ICD-10-CM | POA: Diagnosis not present

## 2017-05-14 DIAGNOSIS — J9611 Chronic respiratory failure with hypoxia: Secondary | ICD-10-CM | POA: Diagnosis not present

## 2017-05-14 DIAGNOSIS — E0590 Thyrotoxicosis, unspecified without thyrotoxic crisis or storm: Secondary | ICD-10-CM | POA: Diagnosis not present

## 2017-05-14 DIAGNOSIS — A4102 Sepsis due to Methicillin resistant Staphylococcus aureus: Secondary | ICD-10-CM | POA: Diagnosis not present

## 2017-05-14 DIAGNOSIS — I480 Paroxysmal atrial fibrillation: Secondary | ICD-10-CM | POA: Diagnosis not present

## 2017-05-14 DIAGNOSIS — I110 Hypertensive heart disease with heart failure: Secondary | ICD-10-CM | POA: Diagnosis not present

## 2017-05-14 DIAGNOSIS — I5032 Chronic diastolic (congestive) heart failure: Secondary | ICD-10-CM | POA: Diagnosis not present

## 2017-05-15 DIAGNOSIS — I358 Other nonrheumatic aortic valve disorders: Secondary | ICD-10-CM | POA: Diagnosis not present

## 2017-05-15 DIAGNOSIS — M069 Rheumatoid arthritis, unspecified: Secondary | ICD-10-CM | POA: Diagnosis not present

## 2017-05-15 DIAGNOSIS — I110 Hypertensive heart disease with heart failure: Secondary | ICD-10-CM | POA: Diagnosis not present

## 2017-05-15 DIAGNOSIS — I480 Paroxysmal atrial fibrillation: Secondary | ICD-10-CM | POA: Diagnosis not present

## 2017-05-15 DIAGNOSIS — Z792 Long term (current) use of antibiotics: Secondary | ICD-10-CM | POA: Diagnosis not present

## 2017-05-15 DIAGNOSIS — Z452 Encounter for adjustment and management of vascular access device: Secondary | ICD-10-CM | POA: Diagnosis not present

## 2017-05-15 DIAGNOSIS — A4102 Sepsis due to Methicillin resistant Staphylococcus aureus: Secondary | ICD-10-CM | POA: Diagnosis not present

## 2017-05-15 DIAGNOSIS — I5032 Chronic diastolic (congestive) heart failure: Secondary | ICD-10-CM | POA: Diagnosis not present

## 2017-05-15 DIAGNOSIS — I50810 Right heart failure, unspecified: Secondary | ICD-10-CM | POA: Diagnosis not present

## 2017-05-15 DIAGNOSIS — Z87891 Personal history of nicotine dependence: Secondary | ICD-10-CM | POA: Diagnosis not present

## 2017-05-15 DIAGNOSIS — J9611 Chronic respiratory failure with hypoxia: Secondary | ICD-10-CM | POA: Diagnosis not present

## 2017-05-15 DIAGNOSIS — Z96653 Presence of artificial knee joint, bilateral: Secondary | ICD-10-CM | POA: Diagnosis not present

## 2017-05-15 DIAGNOSIS — R1310 Dysphagia, unspecified: Secondary | ICD-10-CM | POA: Diagnosis not present

## 2017-05-15 DIAGNOSIS — K219 Gastro-esophageal reflux disease without esophagitis: Secondary | ICD-10-CM | POA: Diagnosis not present

## 2017-05-15 DIAGNOSIS — D509 Iron deficiency anemia, unspecified: Secondary | ICD-10-CM | POA: Diagnosis not present

## 2017-05-16 ENCOUNTER — Encounter: Payer: Self-pay | Admitting: Infectious Diseases

## 2017-05-16 DIAGNOSIS — Z87891 Personal history of nicotine dependence: Secondary | ICD-10-CM | POA: Diagnosis not present

## 2017-05-16 DIAGNOSIS — Z96653 Presence of artificial knee joint, bilateral: Secondary | ICD-10-CM | POA: Diagnosis not present

## 2017-05-16 DIAGNOSIS — I110 Hypertensive heart disease with heart failure: Secondary | ICD-10-CM | POA: Diagnosis not present

## 2017-05-16 DIAGNOSIS — J9611 Chronic respiratory failure with hypoxia: Secondary | ICD-10-CM | POA: Diagnosis not present

## 2017-05-16 DIAGNOSIS — M069 Rheumatoid arthritis, unspecified: Secondary | ICD-10-CM | POA: Diagnosis not present

## 2017-05-16 DIAGNOSIS — I50810 Right heart failure, unspecified: Secondary | ICD-10-CM | POA: Diagnosis not present

## 2017-05-16 DIAGNOSIS — D509 Iron deficiency anemia, unspecified: Secondary | ICD-10-CM | POA: Diagnosis not present

## 2017-05-16 DIAGNOSIS — I358 Other nonrheumatic aortic valve disorders: Secondary | ICD-10-CM | POA: Diagnosis not present

## 2017-05-16 DIAGNOSIS — Z5181 Encounter for therapeutic drug level monitoring: Secondary | ICD-10-CM | POA: Diagnosis not present

## 2017-05-16 DIAGNOSIS — I5032 Chronic diastolic (congestive) heart failure: Secondary | ICD-10-CM | POA: Diagnosis not present

## 2017-05-16 DIAGNOSIS — Z792 Long term (current) use of antibiotics: Secondary | ICD-10-CM | POA: Diagnosis not present

## 2017-05-16 DIAGNOSIS — I480 Paroxysmal atrial fibrillation: Secondary | ICD-10-CM | POA: Diagnosis not present

## 2017-05-16 DIAGNOSIS — R1310 Dysphagia, unspecified: Secondary | ICD-10-CM | POA: Diagnosis not present

## 2017-05-16 DIAGNOSIS — Z452 Encounter for adjustment and management of vascular access device: Secondary | ICD-10-CM | POA: Diagnosis not present

## 2017-05-16 DIAGNOSIS — K219 Gastro-esophageal reflux disease without esophagitis: Secondary | ICD-10-CM | POA: Diagnosis not present

## 2017-05-16 DIAGNOSIS — A4102 Sepsis due to Methicillin resistant Staphylococcus aureus: Secondary | ICD-10-CM | POA: Diagnosis not present

## 2017-05-17 ENCOUNTER — Other Ambulatory Visit: Payer: Self-pay | Admitting: Pharmacist

## 2017-05-17 ENCOUNTER — Telehealth: Payer: Self-pay | Admitting: Licensed Clinical Social Worker

## 2017-05-17 NOTE — Telephone Encounter (Signed)
This [facility] specialist outreach called patient but unable to leave a message.  Voice mail is not set up.

## 2017-05-17 NOTE — Progress Notes (Signed)
[facility] pharmacy lab review  VT elevated - holding today and then decreasing dose. Repeating labs Monday.

## 2017-05-18 DIAGNOSIS — B9562 Methicillin resistant Staphylococcus aureus infection as the cause of diseases classified elsewhere: Secondary | ICD-10-CM | POA: Diagnosis not present

## 2017-05-18 DIAGNOSIS — I38 Endocarditis, valve unspecified: Secondary | ICD-10-CM | POA: Diagnosis not present

## 2017-05-18 DIAGNOSIS — L989 Disorder of the skin and subcutaneous tissue, unspecified: Secondary | ICD-10-CM | POA: Diagnosis not present

## 2017-05-20 ENCOUNTER — Encounter: Payer: Self-pay | Admitting: Infectious Diseases

## 2017-05-20 DIAGNOSIS — Z87891 Personal history of nicotine dependence: Secondary | ICD-10-CM | POA: Diagnosis not present

## 2017-05-20 DIAGNOSIS — I358 Other nonrheumatic aortic valve disorders: Secondary | ICD-10-CM | POA: Diagnosis not present

## 2017-05-20 DIAGNOSIS — M069 Rheumatoid arthritis, unspecified: Secondary | ICD-10-CM | POA: Diagnosis not present

## 2017-05-20 DIAGNOSIS — R1310 Dysphagia, unspecified: Secondary | ICD-10-CM | POA: Diagnosis not present

## 2017-05-20 DIAGNOSIS — I480 Paroxysmal atrial fibrillation: Secondary | ICD-10-CM | POA: Diagnosis not present

## 2017-05-20 DIAGNOSIS — K219 Gastro-esophageal reflux disease without esophagitis: Secondary | ICD-10-CM | POA: Diagnosis not present

## 2017-05-20 DIAGNOSIS — I50810 Right heart failure, unspecified: Secondary | ICD-10-CM | POA: Diagnosis not present

## 2017-05-20 DIAGNOSIS — I5032 Chronic diastolic (congestive) heart failure: Secondary | ICD-10-CM | POA: Diagnosis not present

## 2017-05-20 DIAGNOSIS — I110 Hypertensive heart disease with heart failure: Secondary | ICD-10-CM | POA: Diagnosis not present

## 2017-05-20 DIAGNOSIS — Z5181 Encounter for therapeutic drug level monitoring: Secondary | ICD-10-CM | POA: Diagnosis not present

## 2017-05-20 DIAGNOSIS — Z452 Encounter for adjustment and management of vascular access device: Secondary | ICD-10-CM | POA: Diagnosis not present

## 2017-05-20 DIAGNOSIS — D509 Iron deficiency anemia, unspecified: Secondary | ICD-10-CM | POA: Diagnosis not present

## 2017-05-20 DIAGNOSIS — Z792 Long term (current) use of antibiotics: Secondary | ICD-10-CM | POA: Diagnosis not present

## 2017-05-20 DIAGNOSIS — Z96653 Presence of artificial knee joint, bilateral: Secondary | ICD-10-CM | POA: Diagnosis not present

## 2017-05-20 DIAGNOSIS — J9611 Chronic respiratory failure with hypoxia: Secondary | ICD-10-CM | POA: Diagnosis not present

## 2017-05-20 DIAGNOSIS — A4102 Sepsis due to Methicillin resistant Staphylococcus aureus: Secondary | ICD-10-CM | POA: Diagnosis not present

## 2017-05-22 DIAGNOSIS — K219 Gastro-esophageal reflux disease without esophagitis: Secondary | ICD-10-CM | POA: Diagnosis not present

## 2017-05-22 DIAGNOSIS — J9611 Chronic respiratory failure with hypoxia: Secondary | ICD-10-CM | POA: Diagnosis not present

## 2017-05-22 DIAGNOSIS — Z792 Long term (current) use of antibiotics: Secondary | ICD-10-CM | POA: Diagnosis not present

## 2017-05-22 DIAGNOSIS — Z452 Encounter for adjustment and management of vascular access device: Secondary | ICD-10-CM | POA: Diagnosis not present

## 2017-05-22 DIAGNOSIS — M069 Rheumatoid arthritis, unspecified: Secondary | ICD-10-CM | POA: Diagnosis not present

## 2017-05-22 DIAGNOSIS — D509 Iron deficiency anemia, unspecified: Secondary | ICD-10-CM | POA: Diagnosis not present

## 2017-05-22 DIAGNOSIS — I110 Hypertensive heart disease with heart failure: Secondary | ICD-10-CM | POA: Diagnosis not present

## 2017-05-22 DIAGNOSIS — I50810 Right heart failure, unspecified: Secondary | ICD-10-CM | POA: Diagnosis not present

## 2017-05-22 DIAGNOSIS — R1310 Dysphagia, unspecified: Secondary | ICD-10-CM | POA: Diagnosis not present

## 2017-05-22 DIAGNOSIS — I5032 Chronic diastolic (congestive) heart failure: Secondary | ICD-10-CM | POA: Diagnosis not present

## 2017-05-22 DIAGNOSIS — I480 Paroxysmal atrial fibrillation: Secondary | ICD-10-CM | POA: Diagnosis not present

## 2017-05-22 DIAGNOSIS — A4102 Sepsis due to Methicillin resistant Staphylococcus aureus: Secondary | ICD-10-CM | POA: Diagnosis not present

## 2017-05-22 DIAGNOSIS — Z87891 Personal history of nicotine dependence: Secondary | ICD-10-CM | POA: Diagnosis not present

## 2017-05-22 DIAGNOSIS — I358 Other nonrheumatic aortic valve disorders: Secondary | ICD-10-CM | POA: Diagnosis not present

## 2017-05-22 DIAGNOSIS — Z96653 Presence of artificial knee joint, bilateral: Secondary | ICD-10-CM | POA: Diagnosis not present

## 2017-05-23 ENCOUNTER — Encounter: Payer: Self-pay | Admitting: Infectious Diseases

## 2017-05-23 DIAGNOSIS — Z5181 Encounter for therapeutic drug level monitoring: Secondary | ICD-10-CM | POA: Diagnosis not present

## 2017-05-23 DIAGNOSIS — I50810 Right heart failure, unspecified: Secondary | ICD-10-CM | POA: Diagnosis not present

## 2017-05-23 DIAGNOSIS — I358 Other nonrheumatic aortic valve disorders: Secondary | ICD-10-CM | POA: Diagnosis not present

## 2017-05-23 DIAGNOSIS — I5032 Chronic diastolic (congestive) heart failure: Secondary | ICD-10-CM | POA: Diagnosis not present

## 2017-05-23 DIAGNOSIS — R1310 Dysphagia, unspecified: Secondary | ICD-10-CM | POA: Diagnosis not present

## 2017-05-23 DIAGNOSIS — M069 Rheumatoid arthritis, unspecified: Secondary | ICD-10-CM | POA: Diagnosis not present

## 2017-05-23 DIAGNOSIS — A4102 Sepsis due to Methicillin resistant Staphylococcus aureus: Secondary | ICD-10-CM | POA: Diagnosis not present

## 2017-05-23 DIAGNOSIS — K219 Gastro-esophageal reflux disease without esophagitis: Secondary | ICD-10-CM | POA: Diagnosis not present

## 2017-05-23 DIAGNOSIS — I480 Paroxysmal atrial fibrillation: Secondary | ICD-10-CM | POA: Diagnosis not present

## 2017-05-23 DIAGNOSIS — Z96653 Presence of artificial knee joint, bilateral: Secondary | ICD-10-CM | POA: Diagnosis not present

## 2017-05-23 DIAGNOSIS — Z452 Encounter for adjustment and management of vascular access device: Secondary | ICD-10-CM | POA: Diagnosis not present

## 2017-05-23 DIAGNOSIS — Z792 Long term (current) use of antibiotics: Secondary | ICD-10-CM | POA: Diagnosis not present

## 2017-05-23 DIAGNOSIS — I110 Hypertensive heart disease with heart failure: Secondary | ICD-10-CM | POA: Diagnosis not present

## 2017-05-23 DIAGNOSIS — J9611 Chronic respiratory failure with hypoxia: Secondary | ICD-10-CM | POA: Diagnosis not present

## 2017-05-23 DIAGNOSIS — D509 Iron deficiency anemia, unspecified: Secondary | ICD-10-CM | POA: Diagnosis not present

## 2017-05-23 DIAGNOSIS — Z87891 Personal history of nicotine dependence: Secondary | ICD-10-CM | POA: Diagnosis not present

## 2017-05-24 ENCOUNTER — Other Ambulatory Visit: Payer: Self-pay | Admitting: Pharmacist

## 2017-05-24 DIAGNOSIS — J9611 Chronic respiratory failure with hypoxia: Secondary | ICD-10-CM | POA: Diagnosis not present

## 2017-05-24 DIAGNOSIS — Z87891 Personal history of nicotine dependence: Secondary | ICD-10-CM | POA: Diagnosis not present

## 2017-05-24 DIAGNOSIS — I480 Paroxysmal atrial fibrillation: Secondary | ICD-10-CM | POA: Diagnosis not present

## 2017-05-24 DIAGNOSIS — Z96653 Presence of artificial knee joint, bilateral: Secondary | ICD-10-CM | POA: Diagnosis not present

## 2017-05-24 DIAGNOSIS — I50810 Right heart failure, unspecified: Secondary | ICD-10-CM | POA: Diagnosis not present

## 2017-05-24 DIAGNOSIS — I358 Other nonrheumatic aortic valve disorders: Secondary | ICD-10-CM | POA: Diagnosis not present

## 2017-05-24 DIAGNOSIS — I110 Hypertensive heart disease with heart failure: Secondary | ICD-10-CM | POA: Diagnosis not present

## 2017-05-24 DIAGNOSIS — D509 Iron deficiency anemia, unspecified: Secondary | ICD-10-CM | POA: Diagnosis not present

## 2017-05-24 DIAGNOSIS — R1310 Dysphagia, unspecified: Secondary | ICD-10-CM | POA: Diagnosis not present

## 2017-05-24 DIAGNOSIS — Z792 Long term (current) use of antibiotics: Secondary | ICD-10-CM | POA: Diagnosis not present

## 2017-05-24 DIAGNOSIS — M069 Rheumatoid arthritis, unspecified: Secondary | ICD-10-CM | POA: Diagnosis not present

## 2017-05-24 DIAGNOSIS — K219 Gastro-esophageal reflux disease without esophagitis: Secondary | ICD-10-CM | POA: Diagnosis not present

## 2017-05-24 DIAGNOSIS — I5032 Chronic diastolic (congestive) heart failure: Secondary | ICD-10-CM | POA: Diagnosis not present

## 2017-05-24 DIAGNOSIS — A4102 Sepsis due to Methicillin resistant Staphylococcus aureus: Secondary | ICD-10-CM | POA: Diagnosis not present

## 2017-05-24 DIAGNOSIS — Z452 Encounter for adjustment and management of vascular access device: Secondary | ICD-10-CM | POA: Diagnosis not present

## 2017-05-24 NOTE — Progress Notes (Signed)
[facility] pharmacy lab review  

## 2017-05-25 DIAGNOSIS — B9562 Methicillin resistant Staphylococcus aureus infection as the cause of diseases classified elsewhere: Secondary | ICD-10-CM | POA: Diagnosis not present

## 2017-05-25 DIAGNOSIS — I38 Endocarditis, valve unspecified: Secondary | ICD-10-CM | POA: Diagnosis not present

## 2017-05-25 DIAGNOSIS — L989 Disorder of the skin and subcutaneous tissue, unspecified: Secondary | ICD-10-CM | POA: Diagnosis not present

## 2017-05-26 DIAGNOSIS — R1310 Dysphagia, unspecified: Secondary | ICD-10-CM | POA: Diagnosis not present

## 2017-05-26 DIAGNOSIS — D509 Iron deficiency anemia, unspecified: Secondary | ICD-10-CM | POA: Diagnosis not present

## 2017-05-26 DIAGNOSIS — Z87891 Personal history of nicotine dependence: Secondary | ICD-10-CM | POA: Diagnosis not present

## 2017-05-26 DIAGNOSIS — Z96653 Presence of artificial knee joint, bilateral: Secondary | ICD-10-CM | POA: Diagnosis not present

## 2017-05-26 DIAGNOSIS — K219 Gastro-esophageal reflux disease without esophagitis: Secondary | ICD-10-CM | POA: Diagnosis not present

## 2017-05-26 DIAGNOSIS — I110 Hypertensive heart disease with heart failure: Secondary | ICD-10-CM | POA: Diagnosis not present

## 2017-05-26 DIAGNOSIS — I480 Paroxysmal atrial fibrillation: Secondary | ICD-10-CM | POA: Diagnosis not present

## 2017-05-26 DIAGNOSIS — Z452 Encounter for adjustment and management of vascular access device: Secondary | ICD-10-CM | POA: Diagnosis not present

## 2017-05-26 DIAGNOSIS — M069 Rheumatoid arthritis, unspecified: Secondary | ICD-10-CM | POA: Diagnosis not present

## 2017-05-26 DIAGNOSIS — Z792 Long term (current) use of antibiotics: Secondary | ICD-10-CM | POA: Diagnosis not present

## 2017-05-26 DIAGNOSIS — I358 Other nonrheumatic aortic valve disorders: Secondary | ICD-10-CM | POA: Diagnosis not present

## 2017-05-26 DIAGNOSIS — I5032 Chronic diastolic (congestive) heart failure: Secondary | ICD-10-CM | POA: Diagnosis not present

## 2017-05-26 DIAGNOSIS — A4102 Sepsis due to Methicillin resistant Staphylococcus aureus: Secondary | ICD-10-CM | POA: Diagnosis not present

## 2017-05-26 DIAGNOSIS — I50810 Right heart failure, unspecified: Secondary | ICD-10-CM | POA: Diagnosis not present

## 2017-05-26 DIAGNOSIS — J9611 Chronic respiratory failure with hypoxia: Secondary | ICD-10-CM | POA: Diagnosis not present

## 2017-05-28 ENCOUNTER — Encounter: Payer: Self-pay | Admitting: Infectious Diseases

## 2017-05-28 ENCOUNTER — Ambulatory Visit (INDEPENDENT_AMBULATORY_CARE_PROVIDER_SITE_OTHER): Payer: Medicare Other | Admitting: Infectious Diseases

## 2017-05-28 VITALS — BP 155/89 | HR 63 | Temp 98.3°F | Ht 65.0 in | Wt 150.0 lb

## 2017-05-28 DIAGNOSIS — I358 Other nonrheumatic aortic valve disorders: Secondary | ICD-10-CM

## 2017-05-28 DIAGNOSIS — M069 Rheumatoid arthritis, unspecified: Secondary | ICD-10-CM | POA: Diagnosis not present

## 2017-05-28 DIAGNOSIS — Z5181 Encounter for therapeutic drug level monitoring: Secondary | ICD-10-CM | POA: Diagnosis not present

## 2017-05-28 DIAGNOSIS — B9562 Methicillin resistant Staphylococcus aureus infection as the cause of diseases classified elsewhere: Secondary | ICD-10-CM

## 2017-05-28 DIAGNOSIS — Z452 Encounter for adjustment and management of vascular access device: Secondary | ICD-10-CM | POA: Diagnosis not present

## 2017-05-28 DIAGNOSIS — R7881 Bacteremia: Secondary | ICD-10-CM

## 2017-05-28 NOTE — Patient Instructions (Addendum)
Please continue your antibiotics through Sunday. PICC line to be pulled after this dose.   Please schedule a lab appointment for 6/13 to repeat your blood cultures to make sure the infection is gone.   These results take 7 days to process so unless I need to call you earlier we will call once they have completely finished.

## 2017-05-28 NOTE — Progress Notes (Signed)
Patient: Gabrielle Miller  DOB: 1940/11/25 MRN: [id number] PCP: Ocie Doyne., MD   Patient Active Problem List   Diagnosis Date Noted  . 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 MRSA bacteremia 04/20/2017    Priority: High  . Medication monitoring encounter 05/29/2017  . PICC (peripherally inserted central catheter) in place 05/29/2017  . Rheumatoid arthritis ([place]) 05/29/2017  . Acute metabolic encephalopathy 14/48/1856  . Essential hypertension   . GERD (gastroesophageal reflux disease)   . Depression   . PAF (paroxysmal atrial fibrillation) ([place])   . CHF with right heart failure [facility])     Subjective:   Chief Complaint  Patient presents with  . Hospitalization Follow-up    MRSA bacteremia / endocarditis AV    HPI:  Gabrielle Miller is a 77 y.o. AA female with past medical history of PAF, RA, b/l TKA, GERD, HTN, memory impairment. She was admitted at [facility] April 19th for fevers, intermittent chills and shortness of breath/hypoxia. These symptoms were desribed to be very acute onset the night of her admission. She denied worsened pain/swelling to joints, no recent cuts or injuries. Work up revealed normal CXR, leukocytosis 14.9K with shift and MRSA bacteremia in 4/4 bottles (R-cipro, erythro). She was maintained on vancomycin throughout hospital stay. Blood cultures cleared @ 48h following antibiotics. TTE remarked on aortic valve having mildly calcified annulus with moderate AI. Follow up TEE with 0.8 x 0.6 cm rounded mass attached to noncoronary cusp that may represent calcified vegetation. MRI L spine was negative (c/o back pain during admission). Also at this time she had a rather large abscess to her left external labia that required bedside drainage 4/24 by general surgery team - this also grew MRSA with same resistance pattern as blood specimen. Considering the risk for under-treating possible MRSA endocarditis we discussed with her and elected to  treat for 6 weeks with IV vancomycin.   Since returning home on 04/26/17 she has felt well and "nearly back to normal." Only eats 2 meals a day and asking if that is OK for her. She is ready for the PICC line to be removed but no complaints with the site or concerns for infection/DVT. Denies fevers, chills, orthopnea, leg swelling, back pain, pain in the knees. She does have some neck/shoulder discomfort but is no worse than normal. She has been working with rheumatologist to get her RA pain under better control and tells me she is "starting on a new medication soon for this but was told to wait until the infection has cleared."   Review of Systems  All other systems reviewed and are negative.   Past Medical History:  Diagnosis Date  . CHF with right heart failure ([place])   . Depression   . Essential hypertension   . GERD (gastroesophageal reflux disease)   . PAF (paroxysmal atrial fibrillation) ([place])     Outpatient Medications Prior to Visit  Medication Sig Dispense Refill  . acetaminophen (TYLENOL) 500 MG tablet Take 1,000 mg by mouth every 6 (six) hours as needed (for arthritis pain).    . DULoxetine (CYMBALTA) 30 MG capsule Take 30 mg by mouth daily.  3  . flecainide (TAMBOCOR) 50 MG tablet Take 50 mg by mouth 2 (two) times daily.    . fluticasone-salmeterol (ADVAIR HFA) 115-21 MCG/ACT inhaler Inhale 2 puffs into the lungs 2 (two) times daily as needed (for flares).     . furosemide (LASIX) 40 MG  tablet Take 40 mg by mouth daily.    . metoprolol succinate (TOPROL-XL) 50 MG 24 hr tablet Take 50 mg by mouth daily. Take with or immediately following a meal.    . omeprazole (PRILOSEC) 20 MG capsule Take 20 mg by mouth daily before breakfast.    . oxyCODONE-acetaminophen (PERCOCET) 10-325 MG tablet Take 1 tablet by mouth every 6 (six) hours as needed for pain.    . potassium chloride (K-DUR,KLOR-CON) 10 MEQ tablet Take 10 mEq by mouth daily.    . pravastatin (PRAVACHOL) 20 MG tablet Take 20  mg by mouth daily.    . vancomycin IVPB Inject 750 mg into the vein every 12 (twelve) hours. Indication:  MRSA Endocarditis Last Day of Therapy:  06/02/17 Labs - Sunday/Monday:  CBC/D, BMP, and vancomycin trough. Labs - Thursday:  BMP and vancomycin trough Labs - Every other week:  ESR and CRP 76 Units 0  . UNABLE TO FIND Unnamed pain cream for rheumatoid arthritis: Apply to painful sites two to three times a day as needed for pain     No facility-administered medications prior to visit.      Allergies  Allergen Reactions  . Penicillins Hives    Has patient had a PCN reaction causing immediate rash, facial/tongue/throat swelling, SOB or lightheadedness with hypotension: Yes Has patient had a PCN reaction causing severe rash involving mucus membranes or skin necrosis: No Has patient had a PCN reaction that required hospitalization: No Has patient had a PCN reaction occurring within the last 10 years: Yes If all of the above answers are "NO", then may proceed with Cephalosporin use.    Clementeen Hoof [Iodinated Diagnostic Agents] Nausea And Vomiting    Social History   Tobacco Use  . Smoking status: Former Research scientist (life sciences)  . Smokeless tobacco: Never Used  Substance Use Topics  . Alcohol use: Not Currently  . Drug use: Not Currently    No family history on file.  Objective:   Vitals:   05/28/17 1608  BP: (!) 155/89  Pulse: 63  Temp: 98.3 F (36.8 C)  TempSrc: Oral  Weight: 150 lb (68 kg)  Height: '5\' 5"'$  (1.651 m)   Body mass index is 24.96 kg/m.  Physical Exam  Constitutional: She is oriented to person, place, and time. She appears well-developed and well-nourished.  Seated comfortably in chair. Her son is present today for discussion.   HENT:  Mouth/Throat: No oropharyngeal exudate.  Eyes: Pupils are equal, round, and reactive to light. No scleral icterus.  Cardiovascular: Normal rate.  Murmur (systolic 7-9/4) heard. Pulmonary/Chest: Effort normal and breath sounds normal. No  respiratory distress.  Abdominal: Soft. Bowel sounds are normal. She exhibits no distension.  Musculoskeletal: Normal range of motion. She exhibits no edema.  Bilateral knees w/o swelling, warmth or pain where previously underwent TKRs   Lymphadenopathy:    She has no cervical adenopathy.  Neurological: She is alert and oriented to person, place, and time.  Skin: Skin is warm and dry.  RUE PICC line in place. No biopatch in place but dressing otherwise clean and dry. Site unremarkable w/o erythema, drainage or cording.     Lab Results: Lab Results  Component Value Date   WBC 13.1 (H) 04/26/2017   HGB 8.3 (L) 04/26/2017   HCT 26.0 (L) 04/26/2017   MCV 79.0 04/26/2017   PLT 460 (H) 04/26/2017    Lab Results  Component Value Date   CREATININE 0.73 04/26/2017   BUN 7 04/26/2017  NA 136 04/26/2017   K 3.0 (L) 04/26/2017   CL 101 04/26/2017   CO2 24 04/26/2017    Lab Results  Component Value Date   ALT 10 (L) 04/19/2017   AST 23 04/19/2017   ALKPHOS 106 04/19/2017   BILITOT 1.1 04/19/2017     Assessment & Plan:   Problem List Items Addressed This Visit      Cardiovascular and Mediastinum   Aortic valve endocarditis    Regarding mention in D/C summary about repeating her TEE after antibiotics. We will repeat surveillance blood cultures for her 10 days following completion. Chances are that the vegetation/calcification will still be present to some degree but sterile. Would not recommend to repeat her TEE unless she has signs of worsening valvular dysfunction or return of her fevers following treatment.         Musculoskeletal and Integument   Rheumatoid arthritis ([place])    She mentioned to me today that she is going to start on a "new medication" for her RA. I do worry about recurrence of the labial abscesses/skin abscess for her depending on what she is put on. I discussed this with her and her son today and asked them to discuss further with her rheumatologist.          Other   PICC (peripherally inserted central catheter) in place    Clean and dry. No biopatch in place but site looks unremarkable w/o drainage, erythema or cording. Will have AHC pull after 6/2 dose.       MRSA bacteremia - Primary    It is still a little unclear whether the source of her bacteremia was related to large labial MRSA abscess that required drainage or truly AV endocarditis. Considering the risk of under-treating endocarditis and multiple prosthetic devices in place at risk for seeding, decision was made to treat for 6 weeks. She is now on day 44/48 with vancomycin therapy. She has done well and is feeling much better. Labial abscess has resolved. No signs or indications of heart failure related to AI seen on TEE.   Will continue through planned end date June 2nd. I have asked her to please return 10 days after completing to check surveillance blood cultures here in the clinic. Discussed indications to notify clinic or return to ED (fevers, chills, AMS, HF symptoms etc). Will call her with results of cultures. Follow up with cardiology as well for her underlying AI.       Relevant Orders   Culture, blood (single)   Culture, blood (single)   Medication monitoring encounter    Receiving IV Vancomycin - in review of her labs she has had normalization of WBC count and creatinine has remained stable despite some supratherapeutic vanc troughts (required a few holds of medication). For some reason the ESR/CRP levels have been drawn and are very high likely more related to her underlying RA so not specifically reliable to use for gauging infection clearance.          Janene Madeira, MSN, NP-C [facility] for Infectious [place] Pager: [phone] Office: [phone]  05/29/17  12:56 PM

## 2017-05-29 DIAGNOSIS — Z792 Long term (current) use of antibiotics: Secondary | ICD-10-CM | POA: Diagnosis not present

## 2017-05-29 DIAGNOSIS — R1310 Dysphagia, unspecified: Secondary | ICD-10-CM | POA: Diagnosis not present

## 2017-05-29 DIAGNOSIS — Z87891 Personal history of nicotine dependence: Secondary | ICD-10-CM | POA: Diagnosis not present

## 2017-05-29 DIAGNOSIS — I5032 Chronic diastolic (congestive) heart failure: Secondary | ICD-10-CM | POA: Diagnosis not present

## 2017-05-29 DIAGNOSIS — D509 Iron deficiency anemia, unspecified: Secondary | ICD-10-CM | POA: Diagnosis not present

## 2017-05-29 DIAGNOSIS — A4102 Sepsis due to Methicillin resistant Staphylococcus aureus: Secondary | ICD-10-CM | POA: Diagnosis not present

## 2017-05-29 DIAGNOSIS — I110 Hypertensive heart disease with heart failure: Secondary | ICD-10-CM | POA: Diagnosis not present

## 2017-05-29 DIAGNOSIS — Z5181 Encounter for therapeutic drug level monitoring: Secondary | ICD-10-CM | POA: Insufficient documentation

## 2017-05-29 DIAGNOSIS — Z96653 Presence of artificial knee joint, bilateral: Secondary | ICD-10-CM | POA: Diagnosis not present

## 2017-05-29 DIAGNOSIS — M069 Rheumatoid arthritis, unspecified: Secondary | ICD-10-CM | POA: Diagnosis not present

## 2017-05-29 DIAGNOSIS — J9611 Chronic respiratory failure with hypoxia: Secondary | ICD-10-CM | POA: Diagnosis not present

## 2017-05-29 DIAGNOSIS — I358 Other nonrheumatic aortic valve disorders: Secondary | ICD-10-CM | POA: Diagnosis not present

## 2017-05-29 DIAGNOSIS — Z452 Encounter for adjustment and management of vascular access device: Secondary | ICD-10-CM | POA: Diagnosis not present

## 2017-05-29 DIAGNOSIS — I50810 Right heart failure, unspecified: Secondary | ICD-10-CM | POA: Diagnosis not present

## 2017-05-29 DIAGNOSIS — I480 Paroxysmal atrial fibrillation: Secondary | ICD-10-CM | POA: Diagnosis not present

## 2017-05-29 DIAGNOSIS — K219 Gastro-esophageal reflux disease without esophagitis: Secondary | ICD-10-CM | POA: Diagnosis not present

## 2017-05-29 NOTE — Assessment & Plan Note (Signed)
Receiving IV Vancomycin - in review of her labs she has had normalization of WBC count and creatinine has remained stable despite some supratherapeutic vanc troughts (required a few holds of medication). For some reason the ESR/CRP levels have been drawn and are very high likely more related to her underlying RA so not specifically reliable to use for gauging infection clearance.

## 2017-05-29 NOTE — Assessment & Plan Note (Signed)
Regarding mention in D/C summary about repeating her TEE after antibiotics. We will repeat surveillance blood cultures for her 10 days following completion. Chances are that the vegetation/calcification will still be present to some degree but sterile. Would not recommend to repeat her TEE unless she has signs of worsening valvular dysfunction or return of her fevers following treatment.

## 2017-05-29 NOTE — Assessment & Plan Note (Signed)
She mentioned to me today that she is going to start on a "new medication" for her RA. I do worry about recurrence of the labial abscesses/skin abscess for her depending on what she is put on. I discussed this with her and her son today and asked them to discuss further with her rheumatologist.

## 2017-05-29 NOTE — Assessment & Plan Note (Signed)
Clean and dry. No biopatch in place but site looks unremarkable w/o drainage, erythema or cording. Will have AHC pull after 6/2 dose.

## 2017-05-29 NOTE — Assessment & Plan Note (Signed)
It is still a little unclear whether the source of her bacteremia was related to large labial MRSA abscess that required drainage or truly AV endocarditis. Considering the risk of under-treating endocarditis and multiple prosthetic devices in place at risk for seeding, decision was made to treat for 6 weeks. She is now on day 44/48 with vancomycin therapy. She has done well and is feeling much better. Labial abscess has resolved. No signs or indications of heart failure related to AI seen on TEE.   Will continue through planned end date June 2nd. I have asked her to please return 10 days after completing to check surveillance blood cultures here in the clinic. Discussed indications to notify clinic or return to ED (fevers, chills, AMS, HF symptoms etc). Will call her with results of cultures. Follow up with cardiology as well for her underlying AI.

## 2017-05-30 DIAGNOSIS — Z452 Encounter for adjustment and management of vascular access device: Secondary | ICD-10-CM | POA: Diagnosis not present

## 2017-05-30 DIAGNOSIS — I5032 Chronic diastolic (congestive) heart failure: Secondary | ICD-10-CM | POA: Diagnosis not present

## 2017-05-30 DIAGNOSIS — D509 Iron deficiency anemia, unspecified: Secondary | ICD-10-CM | POA: Diagnosis not present

## 2017-05-30 DIAGNOSIS — J9611 Chronic respiratory failure with hypoxia: Secondary | ICD-10-CM | POA: Diagnosis not present

## 2017-05-30 DIAGNOSIS — I358 Other nonrheumatic aortic valve disorders: Secondary | ICD-10-CM | POA: Diagnosis not present

## 2017-05-30 DIAGNOSIS — Z792 Long term (current) use of antibiotics: Secondary | ICD-10-CM | POA: Diagnosis not present

## 2017-05-30 DIAGNOSIS — K219 Gastro-esophageal reflux disease without esophagitis: Secondary | ICD-10-CM | POA: Diagnosis not present

## 2017-05-30 DIAGNOSIS — R1310 Dysphagia, unspecified: Secondary | ICD-10-CM | POA: Diagnosis not present

## 2017-05-30 DIAGNOSIS — M069 Rheumatoid arthritis, unspecified: Secondary | ICD-10-CM | POA: Diagnosis not present

## 2017-05-30 DIAGNOSIS — I110 Hypertensive heart disease with heart failure: Secondary | ICD-10-CM | POA: Diagnosis not present

## 2017-05-30 DIAGNOSIS — I50810 Right heart failure, unspecified: Secondary | ICD-10-CM | POA: Diagnosis not present

## 2017-05-30 DIAGNOSIS — Z87891 Personal history of nicotine dependence: Secondary | ICD-10-CM | POA: Diagnosis not present

## 2017-05-30 DIAGNOSIS — I480 Paroxysmal atrial fibrillation: Secondary | ICD-10-CM | POA: Diagnosis not present

## 2017-05-30 DIAGNOSIS — Z96653 Presence of artificial knee joint, bilateral: Secondary | ICD-10-CM | POA: Diagnosis not present

## 2017-05-30 DIAGNOSIS — A4102 Sepsis due to Methicillin resistant Staphylococcus aureus: Secondary | ICD-10-CM | POA: Diagnosis not present

## 2017-05-31 ENCOUNTER — Other Ambulatory Visit: Payer: Self-pay | Admitting: Pharmacist

## 2017-05-31 DIAGNOSIS — Z792 Long term (current) use of antibiotics: Secondary | ICD-10-CM | POA: Diagnosis not present

## 2017-05-31 DIAGNOSIS — I358 Other nonrheumatic aortic valve disorders: Secondary | ICD-10-CM | POA: Diagnosis not present

## 2017-05-31 DIAGNOSIS — J9611 Chronic respiratory failure with hypoxia: Secondary | ICD-10-CM | POA: Diagnosis not present

## 2017-05-31 DIAGNOSIS — I110 Hypertensive heart disease with heart failure: Secondary | ICD-10-CM | POA: Diagnosis not present

## 2017-05-31 DIAGNOSIS — I50810 Right heart failure, unspecified: Secondary | ICD-10-CM | POA: Diagnosis not present

## 2017-05-31 DIAGNOSIS — Z452 Encounter for adjustment and management of vascular access device: Secondary | ICD-10-CM | POA: Diagnosis not present

## 2017-05-31 DIAGNOSIS — D509 Iron deficiency anemia, unspecified: Secondary | ICD-10-CM | POA: Diagnosis not present

## 2017-05-31 DIAGNOSIS — Z96653 Presence of artificial knee joint, bilateral: Secondary | ICD-10-CM | POA: Diagnosis not present

## 2017-05-31 DIAGNOSIS — Z87891 Personal history of nicotine dependence: Secondary | ICD-10-CM | POA: Diagnosis not present

## 2017-05-31 DIAGNOSIS — I480 Paroxysmal atrial fibrillation: Secondary | ICD-10-CM | POA: Diagnosis not present

## 2017-05-31 DIAGNOSIS — I5032 Chronic diastolic (congestive) heart failure: Secondary | ICD-10-CM | POA: Diagnosis not present

## 2017-05-31 DIAGNOSIS — R1310 Dysphagia, unspecified: Secondary | ICD-10-CM | POA: Diagnosis not present

## 2017-05-31 DIAGNOSIS — K219 Gastro-esophageal reflux disease without esophagitis: Secondary | ICD-10-CM | POA: Diagnosis not present

## 2017-05-31 DIAGNOSIS — A4102 Sepsis due to Methicillin resistant Staphylococcus aureus: Secondary | ICD-10-CM | POA: Diagnosis not present

## 2017-05-31 DIAGNOSIS — M069 Rheumatoid arthritis, unspecified: Secondary | ICD-10-CM | POA: Diagnosis not present

## 2017-05-31 NOTE — Progress Notes (Signed)
[facility] pharmacy lab review  

## 2017-06-01 DIAGNOSIS — B9562 Methicillin resistant Staphylococcus aureus infection as the cause of diseases classified elsewhere: Secondary | ICD-10-CM | POA: Diagnosis not present

## 2017-06-01 DIAGNOSIS — L989 Disorder of the skin and subcutaneous tissue, unspecified: Secondary | ICD-10-CM | POA: Diagnosis not present

## 2017-06-01 DIAGNOSIS — I38 Endocarditis, valve unspecified: Secondary | ICD-10-CM | POA: Diagnosis not present

## 2017-06-03 ENCOUNTER — Encounter: Payer: Self-pay | Admitting: Infectious Disease

## 2017-06-03 ENCOUNTER — Telehealth: Payer: Self-pay | Admitting: Behavioral Health

## 2017-06-03 DIAGNOSIS — Z87891 Personal history of nicotine dependence: Secondary | ICD-10-CM | POA: Diagnosis not present

## 2017-06-03 DIAGNOSIS — I358 Other nonrheumatic aortic valve disorders: Secondary | ICD-10-CM | POA: Diagnosis not present

## 2017-06-03 DIAGNOSIS — R1310 Dysphagia, unspecified: Secondary | ICD-10-CM | POA: Diagnosis not present

## 2017-06-03 DIAGNOSIS — I110 Hypertensive heart disease with heart failure: Secondary | ICD-10-CM | POA: Diagnosis not present

## 2017-06-03 DIAGNOSIS — I50810 Right heart failure, unspecified: Secondary | ICD-10-CM | POA: Diagnosis not present

## 2017-06-03 DIAGNOSIS — Z96653 Presence of artificial knee joint, bilateral: Secondary | ICD-10-CM | POA: Diagnosis not present

## 2017-06-03 DIAGNOSIS — I5032 Chronic diastolic (congestive) heart failure: Secondary | ICD-10-CM | POA: Diagnosis not present

## 2017-06-03 DIAGNOSIS — Z452 Encounter for adjustment and management of vascular access device: Secondary | ICD-10-CM | POA: Diagnosis not present

## 2017-06-03 DIAGNOSIS — Z792 Long term (current) use of antibiotics: Secondary | ICD-10-CM | POA: Diagnosis not present

## 2017-06-03 DIAGNOSIS — I480 Paroxysmal atrial fibrillation: Secondary | ICD-10-CM | POA: Diagnosis not present

## 2017-06-03 DIAGNOSIS — M069 Rheumatoid arthritis, unspecified: Secondary | ICD-10-CM | POA: Diagnosis not present

## 2017-06-03 DIAGNOSIS — D509 Iron deficiency anemia, unspecified: Secondary | ICD-10-CM | POA: Diagnosis not present

## 2017-06-03 DIAGNOSIS — K219 Gastro-esophageal reflux disease without esophagitis: Secondary | ICD-10-CM | POA: Diagnosis not present

## 2017-06-03 DIAGNOSIS — J9611 Chronic respiratory failure with hypoxia: Secondary | ICD-10-CM | POA: Diagnosis not present

## 2017-06-03 DIAGNOSIS — Z5181 Encounter for therapeutic drug level monitoring: Secondary | ICD-10-CM | POA: Diagnosis not present

## 2017-06-03 DIAGNOSIS — A4102 Sepsis due to Methicillin resistant Staphylococcus aureus: Secondary | ICD-10-CM | POA: Diagnosis not present

## 2017-06-03 NOTE — Telephone Encounter (Signed)
Coretta from [place] called to verify when PICC line could be removed.  She states patient IV antibiotics ended yesterday 06/02/2017.  Gave verbal order per Janene Madeira NP to [place] after last dose of antibiotics 06/02/2017.  Coretta repeated order and verbalized understanding. Pricilla Riffle RN

## 2017-06-03 NOTE — Telephone Encounter (Signed)
Thank you Gabrielle Miller!

## 2017-06-05 DIAGNOSIS — I110 Hypertensive heart disease with heart failure: Secondary | ICD-10-CM | POA: Diagnosis not present

## 2017-06-05 DIAGNOSIS — M069 Rheumatoid arthritis, unspecified: Secondary | ICD-10-CM | POA: Diagnosis not present

## 2017-06-05 DIAGNOSIS — R1310 Dysphagia, unspecified: Secondary | ICD-10-CM | POA: Diagnosis not present

## 2017-06-05 DIAGNOSIS — J9611 Chronic respiratory failure with hypoxia: Secondary | ICD-10-CM | POA: Diagnosis not present

## 2017-06-05 DIAGNOSIS — I480 Paroxysmal atrial fibrillation: Secondary | ICD-10-CM | POA: Diagnosis not present

## 2017-06-05 DIAGNOSIS — I5032 Chronic diastolic (congestive) heart failure: Secondary | ICD-10-CM | POA: Diagnosis not present

## 2017-06-05 DIAGNOSIS — I358 Other nonrheumatic aortic valve disorders: Secondary | ICD-10-CM | POA: Diagnosis not present

## 2017-06-05 DIAGNOSIS — K219 Gastro-esophageal reflux disease without esophagitis: Secondary | ICD-10-CM | POA: Diagnosis not present

## 2017-06-05 DIAGNOSIS — Z452 Encounter for adjustment and management of vascular access device: Secondary | ICD-10-CM | POA: Diagnosis not present

## 2017-06-05 DIAGNOSIS — Z792 Long term (current) use of antibiotics: Secondary | ICD-10-CM | POA: Diagnosis not present

## 2017-06-05 DIAGNOSIS — I50810 Right heart failure, unspecified: Secondary | ICD-10-CM | POA: Diagnosis not present

## 2017-06-05 DIAGNOSIS — Z96653 Presence of artificial knee joint, bilateral: Secondary | ICD-10-CM | POA: Diagnosis not present

## 2017-06-05 DIAGNOSIS — Z87891 Personal history of nicotine dependence: Secondary | ICD-10-CM | POA: Diagnosis not present

## 2017-06-05 DIAGNOSIS — A4102 Sepsis due to Methicillin resistant Staphylococcus aureus: Secondary | ICD-10-CM | POA: Diagnosis not present

## 2017-06-05 DIAGNOSIS — D509 Iron deficiency anemia, unspecified: Secondary | ICD-10-CM | POA: Diagnosis not present

## 2017-06-06 DIAGNOSIS — R1310 Dysphagia, unspecified: Secondary | ICD-10-CM | POA: Diagnosis not present

## 2017-06-06 DIAGNOSIS — I50810 Right heart failure, unspecified: Secondary | ICD-10-CM | POA: Diagnosis not present

## 2017-06-06 DIAGNOSIS — K219 Gastro-esophageal reflux disease without esophagitis: Secondary | ICD-10-CM | POA: Diagnosis not present

## 2017-06-06 DIAGNOSIS — I110 Hypertensive heart disease with heart failure: Secondary | ICD-10-CM | POA: Diagnosis not present

## 2017-06-06 DIAGNOSIS — I358 Other nonrheumatic aortic valve disorders: Secondary | ICD-10-CM | POA: Diagnosis not present

## 2017-06-06 DIAGNOSIS — D509 Iron deficiency anemia, unspecified: Secondary | ICD-10-CM | POA: Diagnosis not present

## 2017-06-06 DIAGNOSIS — Z96653 Presence of artificial knee joint, bilateral: Secondary | ICD-10-CM | POA: Diagnosis not present

## 2017-06-06 DIAGNOSIS — M069 Rheumatoid arthritis, unspecified: Secondary | ICD-10-CM | POA: Diagnosis not present

## 2017-06-06 DIAGNOSIS — Z792 Long term (current) use of antibiotics: Secondary | ICD-10-CM | POA: Diagnosis not present

## 2017-06-06 DIAGNOSIS — Z452 Encounter for adjustment and management of vascular access device: Secondary | ICD-10-CM | POA: Diagnosis not present

## 2017-06-06 DIAGNOSIS — J9611 Chronic respiratory failure with hypoxia: Secondary | ICD-10-CM | POA: Diagnosis not present

## 2017-06-06 DIAGNOSIS — I5032 Chronic diastolic (congestive) heart failure: Secondary | ICD-10-CM | POA: Diagnosis not present

## 2017-06-06 DIAGNOSIS — I480 Paroxysmal atrial fibrillation: Secondary | ICD-10-CM | POA: Diagnosis not present

## 2017-06-06 DIAGNOSIS — A4102 Sepsis due to Methicillin resistant Staphylococcus aureus: Secondary | ICD-10-CM | POA: Diagnosis not present

## 2017-06-06 DIAGNOSIS — Z87891 Personal history of nicotine dependence: Secondary | ICD-10-CM | POA: Diagnosis not present

## 2017-06-07 ENCOUNTER — Other Ambulatory Visit: Payer: Self-pay | Admitting: Pharmacist

## 2017-06-07 NOTE — Progress Notes (Signed)
[facility] pharmacy lab review  

## 2017-06-10 DIAGNOSIS — D509 Iron deficiency anemia, unspecified: Secondary | ICD-10-CM | POA: Diagnosis not present

## 2017-06-10 DIAGNOSIS — M069 Rheumatoid arthritis, unspecified: Secondary | ICD-10-CM | POA: Diagnosis not present

## 2017-06-10 DIAGNOSIS — Z96653 Presence of artificial knee joint, bilateral: Secondary | ICD-10-CM | POA: Diagnosis not present

## 2017-06-10 DIAGNOSIS — I110 Hypertensive heart disease with heart failure: Secondary | ICD-10-CM | POA: Diagnosis not present

## 2017-06-10 DIAGNOSIS — Z792 Long term (current) use of antibiotics: Secondary | ICD-10-CM | POA: Diagnosis not present

## 2017-06-10 DIAGNOSIS — Z452 Encounter for adjustment and management of vascular access device: Secondary | ICD-10-CM | POA: Diagnosis not present

## 2017-06-10 DIAGNOSIS — I50810 Right heart failure, unspecified: Secondary | ICD-10-CM | POA: Diagnosis not present

## 2017-06-10 DIAGNOSIS — I5032 Chronic diastolic (congestive) heart failure: Secondary | ICD-10-CM | POA: Diagnosis not present

## 2017-06-10 DIAGNOSIS — R1310 Dysphagia, unspecified: Secondary | ICD-10-CM | POA: Diagnosis not present

## 2017-06-10 DIAGNOSIS — Z87891 Personal history of nicotine dependence: Secondary | ICD-10-CM | POA: Diagnosis not present

## 2017-06-10 DIAGNOSIS — I480 Paroxysmal atrial fibrillation: Secondary | ICD-10-CM | POA: Diagnosis not present

## 2017-06-10 DIAGNOSIS — A4102 Sepsis due to Methicillin resistant Staphylococcus aureus: Secondary | ICD-10-CM | POA: Diagnosis not present

## 2017-06-10 DIAGNOSIS — J9611 Chronic respiratory failure with hypoxia: Secondary | ICD-10-CM | POA: Diagnosis not present

## 2017-06-10 DIAGNOSIS — I358 Other nonrheumatic aortic valve disorders: Secondary | ICD-10-CM | POA: Diagnosis not present

## 2017-06-10 DIAGNOSIS — K219 Gastro-esophageal reflux disease without esophagitis: Secondary | ICD-10-CM | POA: Diagnosis not present

## 2017-06-11 ENCOUNTER — Other Ambulatory Visit: Payer: Medicare Other

## 2017-06-11 DIAGNOSIS — Z1339 Encounter for screening examination for other mental health and behavioral disorders: Secondary | ICD-10-CM | POA: Diagnosis not present

## 2017-06-11 DIAGNOSIS — R7881 Bacteremia: Secondary | ICD-10-CM

## 2017-06-11 DIAGNOSIS — Z6824 Body mass index (BMI) 24.0-24.9, adult: Secondary | ICD-10-CM | POA: Diagnosis not present

## 2017-06-11 DIAGNOSIS — B9562 Methicillin resistant Staphylococcus aureus infection as the cause of diseases classified elsewhere: Secondary | ICD-10-CM

## 2017-06-11 DIAGNOSIS — M069 Rheumatoid arthritis, unspecified: Secondary | ICD-10-CM | POA: Diagnosis not present

## 2017-06-11 DIAGNOSIS — D500 Iron deficiency anemia secondary to blood loss (chronic): Secondary | ICD-10-CM | POA: Diagnosis not present

## 2017-06-11 DIAGNOSIS — I50810 Right heart failure, unspecified: Secondary | ICD-10-CM | POA: Diagnosis not present

## 2017-06-17 LAB — CULTURE, BLOOD (SINGLE)
MICRO NUMBER: 90699519
MICRO NUMBER:: 90699532
Result:: NO GROWTH
Result:: NO GROWTH
SPECIMEN QUALITY:: ADEQUATE
SPECIMEN QUALITY:: ADEQUATE

## 2017-06-17 NOTE — Progress Notes (Signed)
Please call Ms. Gabrielle Miller with the following results regarding her blood cultures - thank you:  There is no further evidence of bacteria in her blood. I would encourage her to continue to follow up with cardiology for management of her aortic valve. No further follow up here needed at this time.

## 2017-06-18 ENCOUNTER — Telehealth: Payer: Self-pay | Admitting: *Deleted

## 2017-06-18 NOTE — Telephone Encounter (Signed)
-----   Message from [place] Callas, NP sent at 06/17/2017  9:08 AM EDT ----- Please call Gabrielle Miller with the following results regarding her blood cultures - thank you:  There is no further evidence of bacteria in her blood. I would encourage her to continue to follow up with cardiology for management of her aortic valve. No further follow up here needed at this time.

## 2017-06-18 NOTE — Telephone Encounter (Signed)
Per the message from [facility] NP called the patient and got no answer so left message for patient to call the office. Will advise her when she calls back.

## 2017-07-23 DIAGNOSIS — M150 Primary generalized (osteo)arthritis: Secondary | ICD-10-CM | POA: Diagnosis not present

## 2017-07-23 DIAGNOSIS — M0579 Rheumatoid arthritis with rheumatoid factor of multiple sites without organ or systems involvement: Secondary | ICD-10-CM | POA: Diagnosis not present

## 2017-07-23 DIAGNOSIS — I330 Acute and subacute infective endocarditis: Secondary | ICD-10-CM | POA: Diagnosis not present

## 2017-08-12 DIAGNOSIS — Z79899 Other long term (current) drug therapy: Secondary | ICD-10-CM | POA: Diagnosis not present

## 2017-08-12 DIAGNOSIS — E0590 Thyrotoxicosis, unspecified without thyrotoxic crisis or storm: Secondary | ICD-10-CM | POA: Diagnosis not present

## 2017-08-12 DIAGNOSIS — E559 Vitamin D deficiency, unspecified: Secondary | ICD-10-CM | POA: Diagnosis not present

## 2017-08-12 DIAGNOSIS — J449 Chronic obstructive pulmonary disease, unspecified: Secondary | ICD-10-CM | POA: Diagnosis not present

## 2017-08-12 DIAGNOSIS — I50810 Right heart failure, unspecified: Secondary | ICD-10-CM | POA: Diagnosis not present

## 2017-08-12 DIAGNOSIS — I4891 Unspecified atrial fibrillation: Secondary | ICD-10-CM | POA: Diagnosis not present

## 2017-08-12 DIAGNOSIS — M069 Rheumatoid arthritis, unspecified: Secondary | ICD-10-CM | POA: Diagnosis not present

## 2017-08-12 DIAGNOSIS — D500 Iron deficiency anemia secondary to blood loss (chronic): Secondary | ICD-10-CM | POA: Diagnosis not present

## 2017-08-12 DIAGNOSIS — E785 Hyperlipidemia, unspecified: Secondary | ICD-10-CM | POA: Diagnosis not present

## 2017-08-12 DIAGNOSIS — K2970 Gastritis, unspecified, without bleeding: Secondary | ICD-10-CM | POA: Diagnosis not present

## 2017-08-16 DIAGNOSIS — K922 Gastrointestinal hemorrhage, unspecified: Secondary | ICD-10-CM | POA: Diagnosis not present

## 2017-08-16 DIAGNOSIS — I10 Essential (primary) hypertension: Secondary | ICD-10-CM | POA: Diagnosis not present

## 2017-08-16 DIAGNOSIS — E785 Hyperlipidemia, unspecified: Secondary | ICD-10-CM | POA: Diagnosis not present

## 2017-08-16 DIAGNOSIS — I480 Paroxysmal atrial fibrillation: Secondary | ICD-10-CM | POA: Diagnosis not present

## 2017-08-19 DIAGNOSIS — R922 Inconclusive mammogram: Secondary | ICD-10-CM | POA: Diagnosis not present

## 2017-08-19 DIAGNOSIS — R928 Other abnormal and inconclusive findings on diagnostic imaging of breast: Secondary | ICD-10-CM | POA: Diagnosis not present

## 2017-08-23 DIAGNOSIS — D464 Refractory anemia, unspecified: Secondary | ICD-10-CM | POA: Diagnosis not present

## 2017-08-23 DIAGNOSIS — R7989 Other specified abnormal findings of blood chemistry: Secondary | ICD-10-CM | POA: Diagnosis not present

## 2017-08-29 DIAGNOSIS — E538 Deficiency of other specified B group vitamins: Secondary | ICD-10-CM | POA: Diagnosis not present

## 2017-09-03 DIAGNOSIS — N6311 Unspecified lump in the right breast, upper outer quadrant: Secondary | ICD-10-CM | POA: Diagnosis not present

## 2017-09-04 ENCOUNTER — Telehealth: Payer: Self-pay

## 2017-09-04 NOTE — Telephone Encounter (Signed)
Abby, Rn from Dr. Josue Hector office called today to speak with Janene Madeira, NP regarding mutual patient. RN was calling to get clearance for patient to have a localized lumpectomy of the right breast. Patient is currently scheduled to have procedure done on 09/20/17. If okay for patient to proceed with operation can reach Dr. Josue Hector office at [phone]. Will route message to [place] to advise.  [place]

## 2017-09-09 NOTE — Telephone Encounter (Signed)
This is actually Greg's patient and I would prefer for surgical clearance he speak with Dr. Micheal Likens since he is more familiar with her health history - I only saw her briefly in the office to do a breast exam and get her in for a mammogram and ultrasound.  Thank you, kindly.

## 2017-09-09 NOTE — Telephone Encounter (Signed)
Here is the chart per our discussion. Thanks!

## 2017-09-09 NOTE — Telephone Encounter (Signed)
Abby called for update. Please advise.

## 2017-09-09 NOTE — Telephone Encounter (Signed)
Will send to [facility]. Thanks!

## 2017-09-10 NOTE — Telephone Encounter (Signed)
Discussed with physician - I am skeptical if she truly had endocarditis vs calcifications. She also had alternative explanation for bacteremia with impressive labial abscess. From ID standpoint she had cleared post treatment blood cultures and I have no knowledge from her that she has had any further issue since May when I last saw her. Would likely need to follow up with cardiology team to ensure there are no concerns from their perspective, however I have none and recommend proceeding with procedure.

## 2017-09-11 DIAGNOSIS — D509 Iron deficiency anemia, unspecified: Secondary | ICD-10-CM | POA: Diagnosis not present

## 2017-09-18 DIAGNOSIS — D509 Iron deficiency anemia, unspecified: Secondary | ICD-10-CM | POA: Diagnosis not present

## 2017-09-20 DIAGNOSIS — M069 Rheumatoid arthritis, unspecified: Secondary | ICD-10-CM | POA: Diagnosis not present

## 2017-09-20 DIAGNOSIS — J45909 Unspecified asthma, uncomplicated: Secondary | ICD-10-CM | POA: Diagnosis not present

## 2017-09-20 DIAGNOSIS — E785 Hyperlipidemia, unspecified: Secondary | ICD-10-CM | POA: Diagnosis not present

## 2017-09-20 DIAGNOSIS — E559 Vitamin D deficiency, unspecified: Secondary | ICD-10-CM | POA: Diagnosis not present

## 2017-09-20 DIAGNOSIS — D0501 Lobular carcinoma in situ of right breast: Secondary | ICD-10-CM | POA: Diagnosis not present

## 2017-09-20 DIAGNOSIS — Z87891 Personal history of nicotine dependence: Secondary | ICD-10-CM | POA: Diagnosis not present

## 2017-09-20 DIAGNOSIS — I110 Hypertensive heart disease with heart failure: Secondary | ICD-10-CM | POA: Diagnosis not present

## 2017-09-20 DIAGNOSIS — E079 Disorder of thyroid, unspecified: Secondary | ICD-10-CM | POA: Diagnosis not present

## 2017-09-20 DIAGNOSIS — Z79899 Other long term (current) drug therapy: Secondary | ICD-10-CM | POA: Diagnosis not present

## 2017-09-20 DIAGNOSIS — C50911 Malignant neoplasm of unspecified site of right female breast: Secondary | ICD-10-CM | POA: Diagnosis not present

## 2017-09-20 DIAGNOSIS — N6313 Unspecified lump in the right breast, lower outer quadrant: Secondary | ICD-10-CM | POA: Diagnosis not present

## 2017-09-20 DIAGNOSIS — Z7951 Long term (current) use of inhaled steroids: Secondary | ICD-10-CM | POA: Diagnosis not present

## 2017-09-20 DIAGNOSIS — D0502 Lobular carcinoma in situ of left breast: Secondary | ICD-10-CM | POA: Diagnosis not present

## 2017-09-20 DIAGNOSIS — I480 Paroxysmal atrial fibrillation: Secondary | ICD-10-CM | POA: Diagnosis not present

## 2017-09-20 DIAGNOSIS — E538 Deficiency of other specified B group vitamins: Secondary | ICD-10-CM | POA: Diagnosis not present

## 2017-09-20 DIAGNOSIS — I10 Essential (primary) hypertension: Secondary | ICD-10-CM | POA: Diagnosis not present

## 2017-09-20 DIAGNOSIS — D241 Benign neoplasm of right breast: Secondary | ICD-10-CM | POA: Diagnosis not present

## 2017-09-20 DIAGNOSIS — I50810 Right heart failure, unspecified: Secondary | ICD-10-CM | POA: Diagnosis not present

## 2017-09-20 DIAGNOSIS — I4891 Unspecified atrial fibrillation: Secondary | ICD-10-CM | POA: Diagnosis not present

## 2017-09-20 DIAGNOSIS — N6311 Unspecified lump in the right breast, upper outer quadrant: Secondary | ICD-10-CM | POA: Diagnosis not present

## 2017-09-30 DIAGNOSIS — E538 Deficiency of other specified B group vitamins: Secondary | ICD-10-CM | POA: Diagnosis not present

## 2017-10-14 DIAGNOSIS — M0579 Rheumatoid arthritis with rheumatoid factor of multiple sites without organ or systems involvement: Secondary | ICD-10-CM | POA: Diagnosis not present

## 2017-10-14 DIAGNOSIS — M150 Primary generalized (osteo)arthritis: Secondary | ICD-10-CM | POA: Diagnosis not present

## 2017-10-14 DIAGNOSIS — I330 Acute and subacute infective endocarditis: Secondary | ICD-10-CM | POA: Diagnosis not present

## 2017-11-11 DIAGNOSIS — E538 Deficiency of other specified B group vitamins: Secondary | ICD-10-CM | POA: Diagnosis not present

## 2017-11-11 DIAGNOSIS — M069 Rheumatoid arthritis, unspecified: Secondary | ICD-10-CM | POA: Diagnosis not present

## 2017-11-11 DIAGNOSIS — E0590 Thyrotoxicosis, unspecified without thyrotoxic crisis or storm: Secondary | ICD-10-CM | POA: Diagnosis not present

## 2017-11-11 DIAGNOSIS — E785 Hyperlipidemia, unspecified: Secondary | ICD-10-CM | POA: Diagnosis not present

## 2017-11-11 DIAGNOSIS — I10 Essential (primary) hypertension: Secondary | ICD-10-CM | POA: Diagnosis not present

## 2017-11-11 DIAGNOSIS — I4891 Unspecified atrial fibrillation: Secondary | ICD-10-CM | POA: Diagnosis not present

## 2017-11-18 DIAGNOSIS — D509 Iron deficiency anemia, unspecified: Secondary | ICD-10-CM | POA: Diagnosis not present

## 2017-11-19 DIAGNOSIS — D509 Iron deficiency anemia, unspecified: Secondary | ICD-10-CM | POA: Diagnosis not present

## 2018-01-14 DIAGNOSIS — M0579 Rheumatoid arthritis with rheumatoid factor of multiple sites without organ or systems involvement: Secondary | ICD-10-CM | POA: Diagnosis not present

## 2018-01-14 DIAGNOSIS — I330 Acute and subacute infective endocarditis: Secondary | ICD-10-CM | POA: Diagnosis not present

## 2018-01-14 DIAGNOSIS — M150 Primary generalized (osteo)arthritis: Secondary | ICD-10-CM | POA: Diagnosis not present

## 2018-02-13 DIAGNOSIS — I50810 Right heart failure, unspecified: Secondary | ICD-10-CM | POA: Diagnosis not present

## 2018-02-13 DIAGNOSIS — J449 Chronic obstructive pulmonary disease, unspecified: Secondary | ICD-10-CM | POA: Diagnosis not present

## 2018-02-13 DIAGNOSIS — E0590 Thyrotoxicosis, unspecified without thyrotoxic crisis or storm: Secondary | ICD-10-CM | POA: Diagnosis not present

## 2018-02-13 DIAGNOSIS — Z79899 Other long term (current) drug therapy: Secondary | ICD-10-CM | POA: Diagnosis not present

## 2018-02-13 DIAGNOSIS — I10 Essential (primary) hypertension: Secondary | ICD-10-CM | POA: Diagnosis not present

## 2018-02-13 DIAGNOSIS — E785 Hyperlipidemia, unspecified: Secondary | ICD-10-CM | POA: Diagnosis not present

## 2018-02-13 DIAGNOSIS — M069 Rheumatoid arthritis, unspecified: Secondary | ICD-10-CM | POA: Diagnosis not present

## 2018-03-14 DIAGNOSIS — E785 Hyperlipidemia, unspecified: Secondary | ICD-10-CM | POA: Diagnosis not present

## 2018-03-14 DIAGNOSIS — I10 Essential (primary) hypertension: Secondary | ICD-10-CM | POA: Diagnosis not present

## 2018-03-14 DIAGNOSIS — K922 Gastrointestinal hemorrhage, unspecified: Secondary | ICD-10-CM | POA: Diagnosis not present

## 2018-03-14 DIAGNOSIS — I480 Paroxysmal atrial fibrillation: Secondary | ICD-10-CM | POA: Diagnosis not present

## 2018-03-20 DIAGNOSIS — D509 Iron deficiency anemia, unspecified: Secondary | ICD-10-CM | POA: Diagnosis not present

## 2018-05-19 DIAGNOSIS — E785 Hyperlipidemia, unspecified: Secondary | ICD-10-CM | POA: Diagnosis not present

## 2018-05-19 DIAGNOSIS — I4891 Unspecified atrial fibrillation: Secondary | ICD-10-CM | POA: Diagnosis not present

## 2018-05-19 DIAGNOSIS — M7989 Other specified soft tissue disorders: Secondary | ICD-10-CM | POA: Diagnosis not present

## 2018-05-19 DIAGNOSIS — I10 Essential (primary) hypertension: Secondary | ICD-10-CM | POA: Diagnosis not present

## 2018-05-19 DIAGNOSIS — K2970 Gastritis, unspecified, without bleeding: Secondary | ICD-10-CM | POA: Diagnosis not present

## 2018-06-09 DIAGNOSIS — I330 Acute and subacute infective endocarditis: Secondary | ICD-10-CM | POA: Diagnosis not present

## 2018-06-09 DIAGNOSIS — M150 Primary generalized (osteo)arthritis: Secondary | ICD-10-CM | POA: Diagnosis not present

## 2018-06-09 DIAGNOSIS — M0579 Rheumatoid arthritis with rheumatoid factor of multiple sites without organ or systems involvement: Secondary | ICD-10-CM | POA: Diagnosis not present

## 2018-08-21 DIAGNOSIS — R922 Inconclusive mammogram: Secondary | ICD-10-CM | POA: Diagnosis not present

## 2018-08-21 DIAGNOSIS — D0501 Lobular carcinoma in situ of right breast: Secondary | ICD-10-CM | POA: Diagnosis not present

## 2018-09-01 DIAGNOSIS — D0501 Lobular carcinoma in situ of right breast: Secondary | ICD-10-CM | POA: Diagnosis not present

## 2018-09-15 DIAGNOSIS — M150 Primary generalized (osteo)arthritis: Secondary | ICD-10-CM | POA: Diagnosis not present

## 2018-09-15 DIAGNOSIS — M0579 Rheumatoid arthritis with rheumatoid factor of multiple sites without organ or systems involvement: Secondary | ICD-10-CM | POA: Diagnosis not present

## 2018-09-22 DIAGNOSIS — D509 Iron deficiency anemia, unspecified: Secondary | ICD-10-CM | POA: Diagnosis not present

## 2018-09-22 DIAGNOSIS — D649 Anemia, unspecified: Secondary | ICD-10-CM | POA: Diagnosis not present

## 2018-11-07 DIAGNOSIS — I10 Essential (primary) hypertension: Secondary | ICD-10-CM | POA: Diagnosis not present

## 2018-11-07 DIAGNOSIS — D649 Anemia, unspecified: Secondary | ICD-10-CM | POA: Diagnosis not present

## 2018-11-07 DIAGNOSIS — E785 Hyperlipidemia, unspecified: Secondary | ICD-10-CM | POA: Diagnosis not present

## 2018-11-07 DIAGNOSIS — K922 Gastrointestinal hemorrhage, unspecified: Secondary | ICD-10-CM | POA: Diagnosis not present

## 2018-11-07 DIAGNOSIS — I480 Paroxysmal atrial fibrillation: Secondary | ICD-10-CM | POA: Diagnosis not present

## 2018-11-25 DIAGNOSIS — I10 Essential (primary) hypertension: Secondary | ICD-10-CM | POA: Diagnosis not present

## 2018-11-25 DIAGNOSIS — E785 Hyperlipidemia, unspecified: Secondary | ICD-10-CM | POA: Diagnosis not present

## 2018-11-25 DIAGNOSIS — Z9181 History of falling: Secondary | ICD-10-CM | POA: Diagnosis not present

## 2018-11-25 DIAGNOSIS — E0590 Thyrotoxicosis, unspecified without thyrotoxic crisis or storm: Secondary | ICD-10-CM | POA: Diagnosis not present

## 2018-12-18 DIAGNOSIS — M0579 Rheumatoid arthritis with rheumatoid factor of multiple sites without organ or systems involvement: Secondary | ICD-10-CM | POA: Diagnosis not present

## 2018-12-18 DIAGNOSIS — M150 Primary generalized (osteo)arthritis: Secondary | ICD-10-CM | POA: Diagnosis not present

## 2019-02-25 DIAGNOSIS — K2970 Gastritis, unspecified, without bleeding: Secondary | ICD-10-CM | POA: Diagnosis not present

## 2019-02-25 DIAGNOSIS — I4891 Unspecified atrial fibrillation: Secondary | ICD-10-CM | POA: Diagnosis not present

## 2019-02-25 DIAGNOSIS — M069 Rheumatoid arthritis, unspecified: Secondary | ICD-10-CM | POA: Diagnosis not present

## 2019-02-25 DIAGNOSIS — I10 Essential (primary) hypertension: Secondary | ICD-10-CM | POA: Diagnosis not present

## 2019-02-25 DIAGNOSIS — Z139 Encounter for screening, unspecified: Secondary | ICD-10-CM | POA: Diagnosis not present

## 2019-03-06 DIAGNOSIS — E785 Hyperlipidemia, unspecified: Secondary | ICD-10-CM | POA: Diagnosis not present

## 2019-03-06 DIAGNOSIS — E0590 Thyrotoxicosis, unspecified without thyrotoxic crisis or storm: Secondary | ICD-10-CM | POA: Diagnosis not present

## 2019-03-23 DIAGNOSIS — D509 Iron deficiency anemia, unspecified: Secondary | ICD-10-CM | POA: Diagnosis not present

## 2019-03-24 DIAGNOSIS — M150 Primary generalized (osteo)arthritis: Secondary | ICD-10-CM | POA: Diagnosis not present

## 2019-03-24 DIAGNOSIS — M0579 Rheumatoid arthritis with rheumatoid factor of multiple sites without organ or systems involvement: Secondary | ICD-10-CM | POA: Diagnosis not present

## 2019-04-13 IMAGING — MR MR LUMBAR SPINE WO/W CM
4 of 7 series · 21 of 48 positions shown · IV contrast (multihance)
Comparison: None.

CLINICAL DATA: Initial evaluation for low back pain, bacteremia.

EXAM:
MRI LUMBAR SPINE WITHOUT AND WITH CONTRAST
TECHNIQUE: Multiplanar and multiecho pulse sequences of the lumbar spine were
obtained without and with intravenous contrast.
CONTRAST:  15mL MULTIHANCE GADOBENATE DIMEGLUMINE 529 MG/ML IV SOLN

[Series 3: T2 · sagittal · 4.0mm · 0.55mm/px · 5 of 13 slices shown (1 of 2)]
[im 1/13]
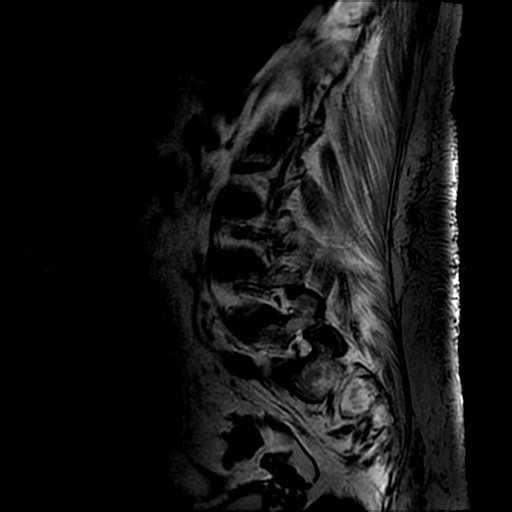
[im 4/13]
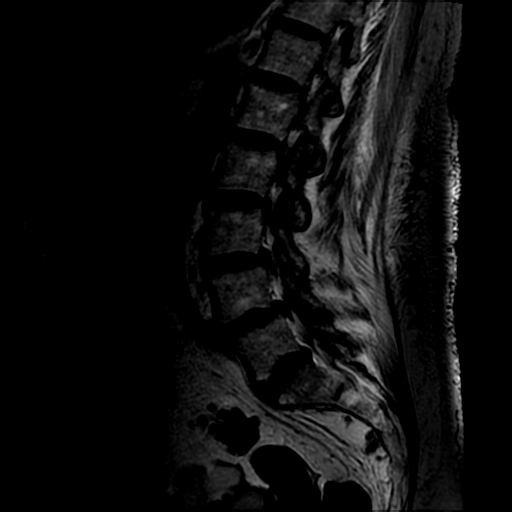
[im 7/13]
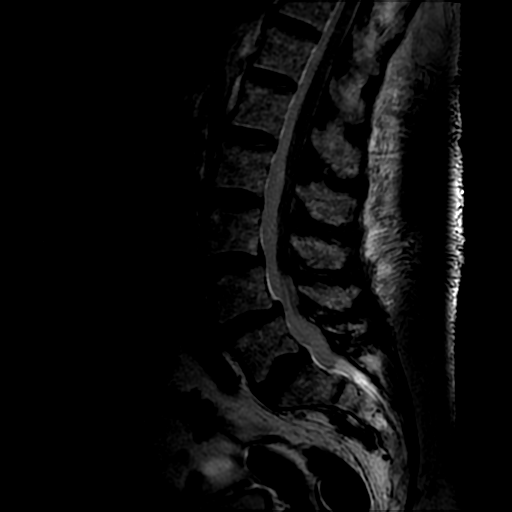
[im 10/13]
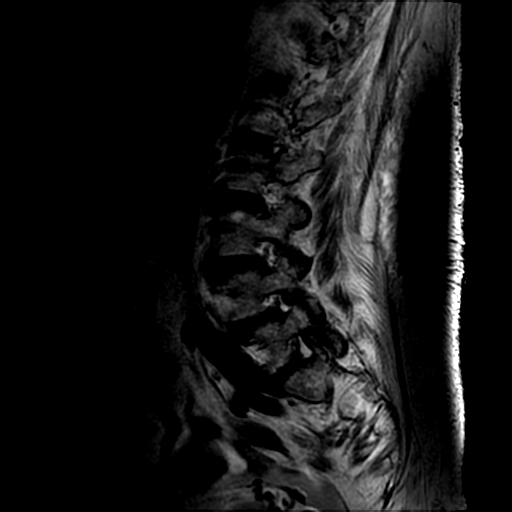
[im 13/13]
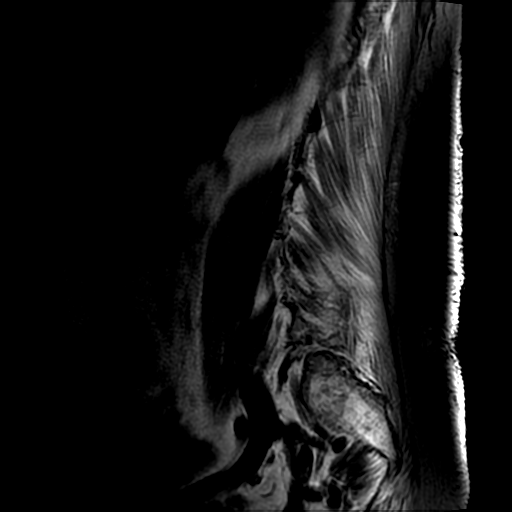

[Series 5: T1 · sagittal · 4.0mm · 0.55mm/px · 4 of 13 slices shown (1 of 2)]
[im 1/13]
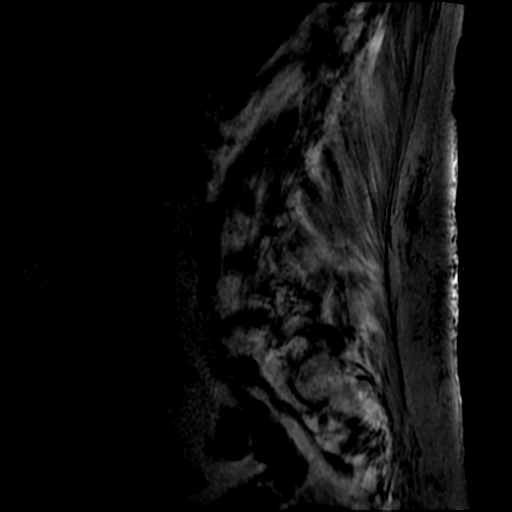
[im 5/13]
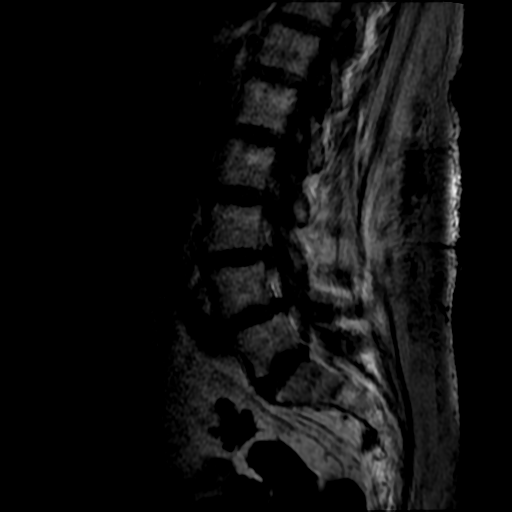
[im 9/13]
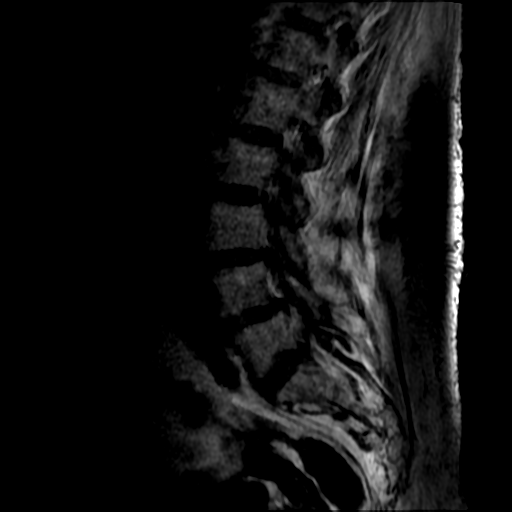
[im 13/13]
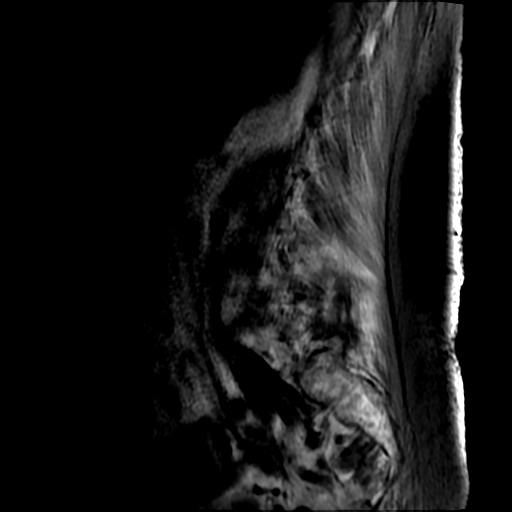

[Series 6: T2 · axial · 4.0mm · 0.39mm/px · z∈[-80,+110]mm · 8 of 33 slices shown (2 of 2)]
[im 1/33]
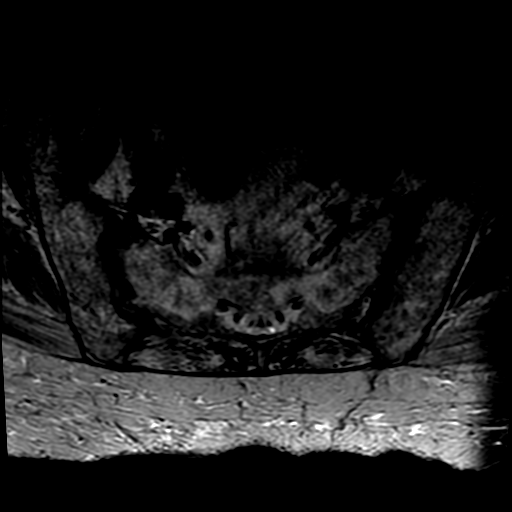
[im 4/33]
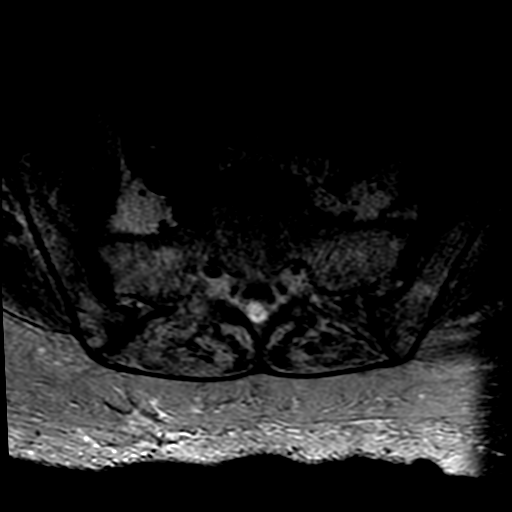
[im 11/33]
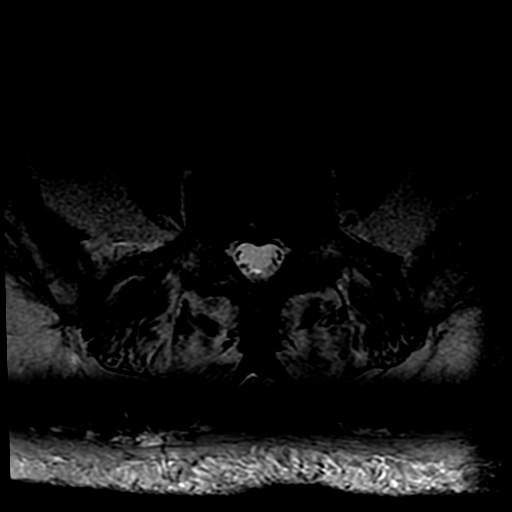
[im 15/33]
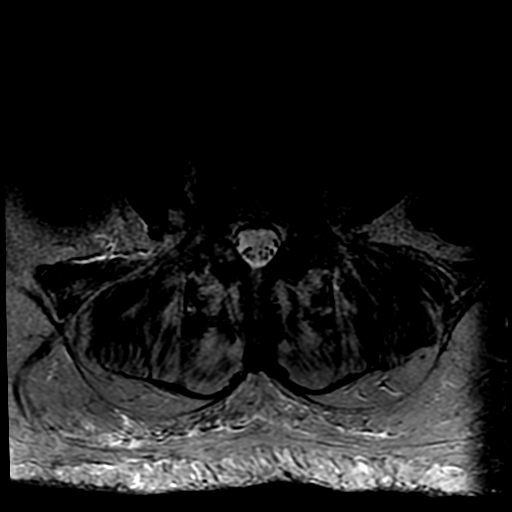
[im 18/33]
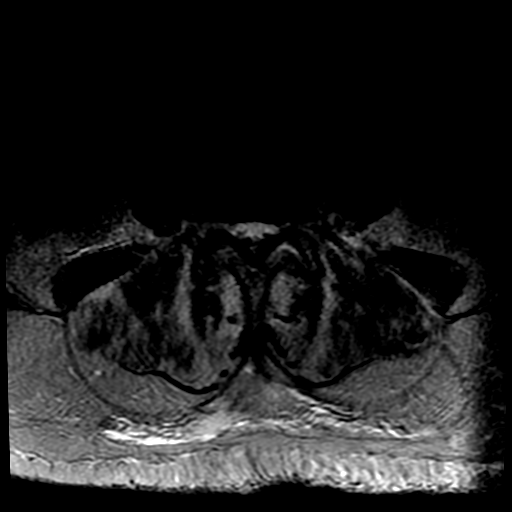
[im 22/33]
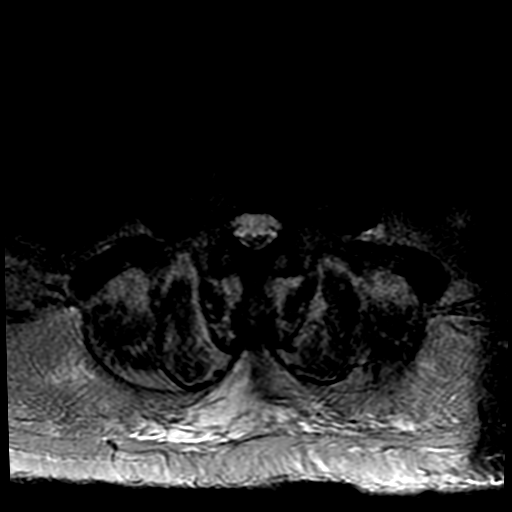
[im 29/33]
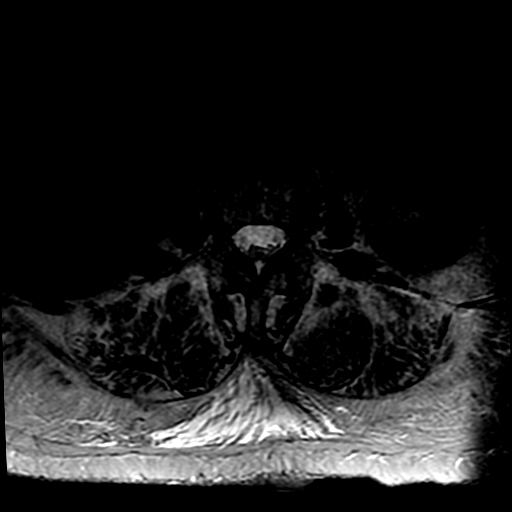
[im 33/33]
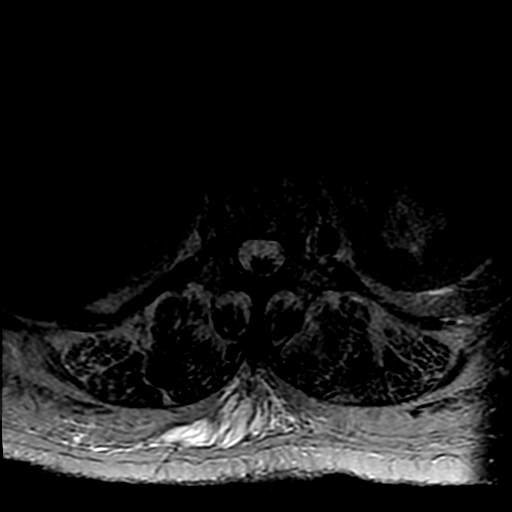

[Series 7: T1 · axial · 4.0mm · 0.78mm/px · z∈[-80,+90]mm · 4 of 33 slices shown (2 of 2)]
[im 1/33]
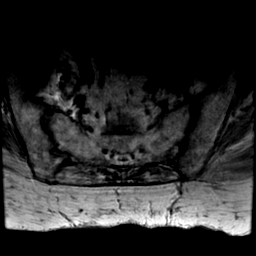
[im 4/33]
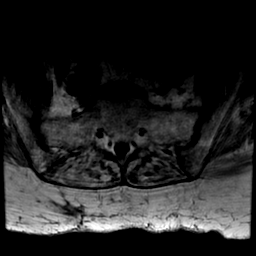
[im 18/33]
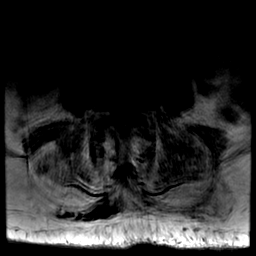
[im 29/33]
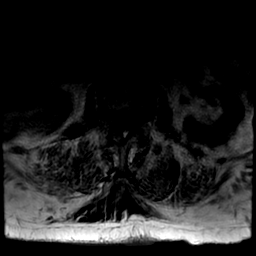

[21 of 48 positions shown; findings below may reference images not displayed]

FINDINGS: Segmentation:  Study moderately degraded by motion artifact.

Normal segmentation. Lowest well-formed disc labeled the L5-S1
level.

Alignment: 3 mm anterolisthesis of L3 on L4 and L4 on L5. Vertebral
bodies otherwise normally aligned with preservation of the normal
lumbar lordosis.

Vertebrae: Vertebral body heights maintained without evidence for
acute or chronic fracture. Bone marrow signal intensity within
normal limits. No discrete or worrisome osseous lesions. No findings
to suggest osteomyelitis discitis no significant inflammatory
changes seen about the facets.

Conus medullaris and cauda equina: Conus extends to the L2 level.
Conus and cauda equina appear normal.

Paraspinal and other soft tissues: No definite acute paraspinous
soft tissue abnormality identified. Mildly elevated STIR signal
intensity without associated enhancement within the posterior
paraspinous musculature favored to be artifactual. T2 hyperintense
cyst noted within the left kidney. Visualized visceral structures
otherwise unremarkable.

Disc levels:

L1-2:  Unremarkable.

L2-3: Mild disc desiccation with mild disc bulge. No significant
canal or foraminal stenosis.

L3-4: Mild anterolisthesis. Mild diffuse disc bulge with
intervertebral disc space narrowing. Mild facet and ligament flavum
hypertrophy. Possible subtle changes related to Baastrup's disease.
Mild spinal stenosis. Very mild bilateral foraminal narrowing.

L4-5: Anterolisthesis. Mild diffuse disc bulge with intervertebral
disc space narrowing. Mild to moderate facet and ligament flavum
hypertrophy. Resultant mild canal with bilateral lateral recess
stenosis. Mild bilateral foraminal narrowing, slightly worse on the
right. Possible subtle changes related to Baastrup's disease.

L5-S1: Shallow posterior disc bulge with disc desiccation. Mild
facet hypertrophy. Possible subtle changes related to Baastrup's
disease. No significant stenosis.
IMPRESSION: 1. No acute abnormality within the lumbar spine. No findings to
suggest osteomyelitis/discitis or other intraspinal infection.
2. 3 mm anterolisthesis of L3 on L4 and L4 on L5 with resultant mild
canal and bilateral foraminal stenosis.
3. Possible subtle changes related to Baastrup's disease at L3-4
through L5-S1.

## 2019-06-24 DIAGNOSIS — M0579 Rheumatoid arthritis with rheumatoid factor of multiple sites without organ or systems involvement: Secondary | ICD-10-CM | POA: Diagnosis not present

## 2019-06-24 DIAGNOSIS — M150 Primary generalized (osteo)arthritis: Secondary | ICD-10-CM | POA: Diagnosis not present

## 2019-10-29 DIAGNOSIS — D509 Iron deficiency anemia, unspecified: Secondary | ICD-10-CM | POA: Diagnosis not present

## 2020-04-26 ENCOUNTER — Telehealth: Payer: Self-pay | Admitting: Oncology

## 2020-04-26 NOTE — Telephone Encounter (Signed)
Patient rescheduled from 4/28 to [place] 9:45 am - Follow Up 10:15 due to working in [facility].  Ok per [employer]

## 2020-04-28 ENCOUNTER — Inpatient Hospital Stay: Payer: Medicare HMO

## 2020-04-28 ENCOUNTER — Inpatient Hospital Stay: Payer: Medicare HMO | Admitting: Oncology

## 2020-05-01 ENCOUNTER — Other Ambulatory Visit: Payer: Self-pay | Admitting: Oncology

## 2020-05-01 DIAGNOSIS — N189 Chronic kidney disease, unspecified: Secondary | ICD-10-CM

## 2020-05-01 DIAGNOSIS — D631 Anemia in chronic kidney disease: Secondary | ICD-10-CM

## 2020-05-01 NOTE — Progress Notes (Signed)
[facility]  [address] [place],  [place]  [address] [phone]  Clinic Day:  05/02/2020  Referring physician: Ocie Doyne., MD   HISTORY OF PRESENT ILLNESS:  The patient is a 80 y.o. female with a history of anemia secondary to chronic disease.   In the past, she also had iron deficiency anemia where IV Feraheme was very effective in replenishing her iron stores.  She comes in today to reassess her hemoglobin.  Since her last visit, the patient claims to be doing fairly well.  She denies having increased fatigue or any overt forms of blood loss which concern her for progressive anemia.  Of note, she continues to take methotrexate for her rheumatoid arthritis.  PHYSICAL EXAM:  Blood pressure (!) 213/103, pulse 67, temperature 98.5 F (36.9 C), resp. rate 14, height 5\' 4"  (1.626 m), weight 178 lb 12.8 oz (81.1 kg), SpO2 95 %. Wt Readings from Last 3 Encounters:  05/02/20 178 lb 12.8 oz (81.1 kg)  05/28/17 150 lb (68 kg)  04/23/17 171 lb (77.6 kg)   Body mass index is 30.69 kg/m. Performance status (ECOG): 1 Physical Exam Constitutional:      Appearance: Normal appearance. She is not ill-appearing.  HENT:     Mouth/Throat:     Mouth: Mucous membranes are moist.     Pharynx: Oropharynx is clear. No oropharyngeal exudate or posterior oropharyngeal erythema.  Cardiovascular:     Rate and Rhythm: Normal rate and regular rhythm.     Heart sounds: No murmur heard. No friction rub. No gallop.   Pulmonary:     Effort: Pulmonary effort is normal. No respiratory distress.     Breath sounds: Normal breath sounds. No wheezing, rhonchi or rales.  Chest:  Breasts:     Right: No axillary adenopathy or supraclavicular adenopathy.     Left: No axillary adenopathy or supraclavicular adenopathy.    Abdominal:     General: Bowel sounds are normal. There is no distension.     Palpations: Abdomen is soft. There is no mass.     Tenderness: There is no  abdominal tenderness.  Musculoskeletal:        General: No swelling.     Right lower leg: No edema.     Left lower leg: No edema.  Lymphadenopathy:     Cervical: No cervical adenopathy.     Upper Body:     Right upper body: No supraclavicular or axillary adenopathy.     Left upper body: No supraclavicular or axillary adenopathy.     Lower Body: No right inguinal adenopathy. No left inguinal adenopathy.  Skin:    General: Skin is warm.     Coloration: Skin is not jaundiced.     Findings: No lesion or rash.  Neurological:     General: No focal deficit present.     Mental Status: She is alert and oriented to person, place, and time. Mental status is at baseline.     Cranial Nerves: Cranial nerves are intact.  Psychiatric:        Mood and Affect: Mood normal.        Behavior: Behavior normal.        Thought Content: Thought content normal.    LABS:   CBC Latest Ref Rng & Units 05/02/2020 04/26/2017 04/25/2017  WBC - 6.8 13.1(H) 13.6(H)  Hemoglobin 12.0 - 16.0 10.5(A) 8.3(L) 9.2(L)  Hematocrit 36 - 46 33(A) 26.0(L) 29.3(L)  Platelets 150 - 399 319 460(H)  493(H)   CMP Latest Ref Rng & Units 05/02/2020 04/26/2017 04/25/2017  Glucose 65 - 99 mg/dL - 95 107(H)  BUN 4 - 21 13 7 9   Creatinine 0.5 - 1.1 0.6 0.73 0.70  Sodium 137 - 147 138 136 135  Potassium 3.4 - 5.3 4.1 3.0(L) 3.5  Chloride 99 - 108 104 101 103  CO2 13 - 22 28(A) 24 23  Calcium 8.7 - 10.7 8.7 8.4(L) 8.7(L)  Total Protein 6.5 - 8.1 g/dL - - -  Total Bilirubin 0.3 - 1.2 mg/dL - - -  Alkaline Phos 25 - 125 163(A) - -  AST 13 - 35 18 - -  ALT 7 - 35 9 - -    Ref. Range 05/02/2020 09:57  Iron Latest Ref Range: 28 - 170 ug/dL 29  UIBC Latest Units: ug/dL 281  TIBC Latest Ref Range: 250 - 450 ug/dL 310  Saturation Ratios Latest Ref Range: 10.4 - 31.8 % 9 (L)  Ferritin Latest Ref Range: 11 - 307 ng/mL 98     ASSESSMENT & PLAN:  Assessment/Plan:  A 80 y.o. female with anemia of chronic disease.  I am pleased as her  hemoglobin remains above 10.  When evaluating her iron parameters, her iron panel remains consistent with anemia of chronic disease, not iron deficiency anemia.  As mentioned previously, this patient has long-standing rheumatoid arthritis and is on methotrexate, which likely explains the pattern of results from her iron panel today.  I remain very concerned with her poorly controlled blood pressure.  She understands that if it remains this poorly controlled, she runs the risk of developing a cardiovascular insult.  Otherwise, I will see her back in 6 months for repeat clinical assessment.  The patient understands all the plans discussed today and is in agreement with them.  Glynis Hunsucker Macarthur Critchley, MD

## 2020-05-02 ENCOUNTER — Other Ambulatory Visit: Payer: Self-pay | Admitting: Hematology and Oncology

## 2020-05-02 ENCOUNTER — Telehealth: Payer: Self-pay | Admitting: Oncology

## 2020-05-02 ENCOUNTER — Inpatient Hospital Stay: Payer: Medicare Other | Attending: Oncology

## 2020-05-02 ENCOUNTER — Other Ambulatory Visit: Payer: Self-pay

## 2020-05-02 ENCOUNTER — Other Ambulatory Visit: Payer: Self-pay | Admitting: Oncology

## 2020-05-02 ENCOUNTER — Inpatient Hospital Stay (INDEPENDENT_AMBULATORY_CARE_PROVIDER_SITE_OTHER): Payer: Medicare Other | Admitting: Oncology

## 2020-05-02 DIAGNOSIS — D631 Anemia in chronic kidney disease: Secondary | ICD-10-CM

## 2020-05-02 DIAGNOSIS — M069 Rheumatoid arthritis, unspecified: Secondary | ICD-10-CM | POA: Diagnosis not present

## 2020-05-02 DIAGNOSIS — D638 Anemia in other chronic diseases classified elsewhere: Secondary | ICD-10-CM

## 2020-05-02 DIAGNOSIS — N189 Chronic kidney disease, unspecified: Secondary | ICD-10-CM

## 2020-05-02 DIAGNOSIS — D508 Other iron deficiency anemias: Secondary | ICD-10-CM | POA: Diagnosis not present

## 2020-05-02 LAB — CBC AND DIFFERENTIAL
HCT: 33 — AB (ref 36–46)
Hemoglobin: 10.5 — AB (ref 12.0–16.0)
Neutrophils Absolute: 4.56
Platelets: 319 (ref 150–399)
WBC: 6.8

## 2020-05-02 LAB — HEPATIC FUNCTION PANEL
ALT: 9 (ref 7–35)
AST: 18 (ref 13–35)
Alkaline Phosphatase: 163 — AB (ref 25–125)
Bilirubin, Total: 0.6

## 2020-05-02 LAB — CBC
MCV: 88 (ref 80–94)
RBC: 3.75 — AB (ref 3.87–5.11)

## 2020-05-02 LAB — BASIC METABOLIC PANEL
BUN: 13 (ref 4–21)
CO2: 28 — AB (ref 13–22)
Chloride: 104 (ref 99–108)
Creatinine: 0.6 (ref 0.5–1.1)
Glucose: 91
Potassium: 4.1 (ref 3.4–5.3)
Sodium: 138 (ref 137–147)

## 2020-05-02 LAB — COMPREHENSIVE METABOLIC PANEL
Albumin: 3.9 (ref 3.5–5.0)
Calcium: 8.7 (ref 8.7–10.7)

## 2020-05-02 LAB — IRON AND TIBC
Iron: 29 ug/dL (ref 28–170)
Saturation Ratios: 9 % — ABNORMAL LOW (ref 10.4–31.8)
TIBC: 310 ug/dL (ref 250–450)
UIBC: 281 ug/dL

## 2020-05-02 LAB — FERRITIN: Ferritin: 98 ng/mL (ref 11–307)

## 2020-05-02 NOTE — Telephone Encounter (Signed)
Patient scheduled for 6 month Labs, Follow Up.  Gave patient Appt Summary

## 2020-10-27 NOTE — Progress Notes (Incomplete)
[facility]  [address] [place],  [place]  [address] [phone]  Clinic Day:  10/27/2020  Referring physician: Penelope Coop, FNP  This document serves as a record of services personally performed by Marice Potter, MD. It was created on their behalf by [facility] E, a trained medical scribe. The creation of this record is based on the scribe's personal observations and the provider's statements to them.  HISTORY OF PRESENT ILLNESS:  The patient is a 80 y.o. female with a history of anemia secondary to chronic disease.   In the past, she also had iron deficiency anemia where IV Feraheme was very effective in replenishing her iron stores.  She comes in today to reassess her hemoglobin.  Since her last visit, the patient claims to be doing fairly well.  She denies having increased fatigue or any overt forms of blood loss which concern her for progressive anemia.  Of note, she continues to take methotrexate for her rheumatoid arthritis.  PHYSICAL EXAM:  There were no vitals taken for this visit. Wt Readings from Last 3 Encounters:  05/02/20 178 lb 12.8 oz (81.1 kg)  05/28/17 150 lb (68 kg)  04/23/17 171 lb (77.6 kg)   There is no height or weight on file to calculate BMI. Performance status (ECOG): 1 Physical Exam Constitutional:      Appearance: Normal appearance. She is not ill-appearing.  HENT:     Mouth/Throat:     Mouth: Mucous membranes are moist.     Pharynx: Oropharynx is clear. No oropharyngeal exudate or posterior oropharyngeal erythema.  Cardiovascular:     Rate and Rhythm: Normal rate and regular rhythm.     Heart sounds: No murmur heard.   No friction rub. No gallop.  Pulmonary:     Effort: Pulmonary effort is normal. No respiratory distress.     Breath sounds: Normal breath sounds. No wheezing, rhonchi or rales.  Abdominal:     General: Bowel sounds are normal. There is no distension.     Palpations: Abdomen is soft.  There is no mass.     Tenderness: There is no abdominal tenderness.  Musculoskeletal:        General: No swelling.     Right lower leg: No edema.     Left lower leg: No edema.  Lymphadenopathy:     Cervical: No cervical adenopathy.     Upper Body:     Right upper body: No supraclavicular or axillary adenopathy.     Left upper body: No supraclavicular or axillary adenopathy.     Lower Body: No right inguinal adenopathy. No left inguinal adenopathy.  Skin:    General: Skin is warm.     Coloration: Skin is not jaundiced.     Findings: No lesion or rash.  Neurological:     General: No focal deficit present.     Mental Status: She is alert and oriented to person, place, and time. Mental status is at baseline.  Psychiatric:        Mood and Affect: Mood normal.        Behavior: Behavior normal.        Thought Content: Thought content normal.   LABS:   CBC Latest Ref Rng & Units 05/02/2020 04/26/2017 04/25/2017  WBC - 6.8 13.1(H) 13.6(H)  Hemoglobin 12.0 - 16.0 10.5(A) 8.3(L) 9.2(L)  Hematocrit 36 - 46 33(A) 26.0(L) 29.3(L)  Platelets 150 - 399 319 460(H) 493(H)   CMP Latest Ref Rng &  Units 05/02/2020 04/26/2017 04/25/2017  Glucose 65 - 99 mg/dL - 95 107(H)  BUN 4 - 21 13 7 9   Creatinine 0.5 - 1.1 0.6 0.73 0.70  Sodium 137 - 147 138 136 135  Potassium 3.4 - 5.3 4.1 3.0(L) 3.5  Chloride 99 - 108 104 101 103  CO2 13 - 22 28(A) 24 23  Calcium 8.7 - 10.7 8.7 8.4(L) 8.7(L)  Total Protein 6.5 - 8.1 g/dL - - -  Total Bilirubin 0.3 - 1.2 mg/dL - - -  Alkaline Phos 25 - 125 163(A) - -  AST 13 - 35 18 - -  ALT 7 - 35 9 - -    Ref. Range 05/02/2020 09:57  Iron Latest Ref Range: 28 - 170 ug/dL 29  UIBC Latest Units: ug/dL 281  TIBC Latest Ref Range: 250 - 450 ug/dL 310  Saturation Ratios Latest Ref Range: 10.4 - 31.8 % 9 (L)  Ferritin Latest Ref Range: 11 - 307 ng/mL 98     ASSESSMENT & PLAN:  Assessment/Plan:  A 80 y.o. female with anemia of chronic disease.  I am pleased as her  hemoglobin remains above 10.  When evaluating her iron parameters, her iron panel remains consistent with anemia of chronic disease, not iron deficiency anemia.  As mentioned previously, this patient has long-standing rheumatoid arthritis and is on methotrexate, which likely explains the pattern of results from her iron panel today. I will see her back in 6 months for repeat clinical assessment.  The patient understands all the plans discussed today and is in agreement with them.   I, Rita Ohara, am acting as scribe for Marice Potter, MD    I have reviewed this report as typed by the medical scribe, and it is complete and accurate.  Dequincy Macarthur Critchley, MD

## 2020-11-02 ENCOUNTER — Other Ambulatory Visit: Payer: Medicare Other

## 2020-11-02 ENCOUNTER — Ambulatory Visit: Payer: Medicare Other | Admitting: Oncology

## 2020-12-12 NOTE — Progress Notes (Incomplete)
[facility]  [address] [place],  [place]  [address] [phone]  Clinic Day:  12/12/2020  Referring physician: Penelope Coop, FNP  This document serves as a record of services personally performed by Marice Potter, MD. It was created on their behalf by Curry,Lauren E, a trained medical scribe. The creation of this record is based on the scribe's personal observations and the provider's statements to them.  HISTORY OF PRESENT ILLNESS:  The patient is a 80 y.o. female with a history of anemia secondary to chronic disease.  In the past, she also had iron deficiency anemia where IV Feraheme was very effective in replenishing her iron stores.  She comes in today to reassess her hemoglobin.  Since her last visit, the patient claims to be doing fairly well.  She denies having increased fatigue or any overt forms of blood loss which concern her for progressive anemia.  Of note, she continues to take methotrexate for her rheumatoid arthritis.  PHYSICAL EXAM:  There were no vitals taken for this visit. Wt Readings from Last 3 Encounters:  05/02/20 178 lb 12.8 oz (81.1 kg)  05/28/17 150 lb (68 kg)  04/23/17 171 lb (77.6 kg)   There is no height or weight on file to calculate BMI. Performance status (ECOG): 1 Physical Exam Constitutional:      Appearance: Normal appearance. She is not ill-appearing.  HENT:     Mouth/Throat:     Mouth: Mucous membranes are moist.     Pharynx: Oropharynx is clear. No oropharyngeal exudate or posterior oropharyngeal erythema.  Cardiovascular:     Rate and Rhythm: Normal rate and regular rhythm.     Heart sounds: No murmur heard.   No friction rub. No gallop.  Pulmonary:     Effort: Pulmonary effort is normal. No respiratory distress.     Breath sounds: Normal breath sounds. No wheezing, rhonchi or rales.  Abdominal:     General: Bowel sounds are normal. There is no distension.     Palpations: Abdomen is soft. There  is no mass.     Tenderness: There is no abdominal tenderness.  Musculoskeletal:        General: No swelling.     Right lower leg: No edema.     Left lower leg: No edema.  Lymphadenopathy:     Cervical: No cervical adenopathy.     Upper Body:     Right upper body: No supraclavicular or axillary adenopathy.     Left upper body: No supraclavicular or axillary adenopathy.     Lower Body: No right inguinal adenopathy. No left inguinal adenopathy.  Skin:    General: Skin is warm.     Coloration: Skin is not jaundiced.     Findings: No lesion or rash.  Neurological:     General: No focal deficit present.     Mental Status: She is alert and oriented to person, place, and time. Mental status is at baseline.  Psychiatric:        Mood and Affect: Mood normal.        Behavior: Behavior normal.        Thought Content: Thought content normal.   LABS:   CBC Latest Ref Rng & Units 05/02/2020 04/26/2017 04/25/2017  WBC - 6.8 13.1(H) 13.6(H)  Hemoglobin 12.0 - 16.0 10.5(A) 8.3(L) 9.2(L)  Hematocrit 36 - 46 33(A) 26.0(L) 29.3(L)  Platelets 150 - 399 319 460(H) 493(H)   CMP Latest Ref Rng & Units  05/02/2020 04/26/2017 04/25/2017  Glucose 65 - 99 mg/dL - 95 107(H)  BUN 4 - 21 13 7 9   Creatinine 0.5 - 1.1 0.6 0.73 0.70  Sodium 137 - 147 138 136 135  Potassium 3.4 - 5.3 4.1 3.0(L) 3.5  Chloride 99 - 108 104 101 103  CO2 13 - 22 28(A) 24 23  Calcium 8.7 - 10.7 8.7 8.4(L) 8.7(L)  Total Protein 6.5 - 8.1 g/dL - - -  Total Bilirubin 0.3 - 1.2 mg/dL - - -  Alkaline Phos 25 - 125 163(A) - -  AST 13 - 35 18 - -  ALT 7 - 35 9 - -    Ref. Range 05/02/2020 09:57  Iron Latest Ref Range: 28 - 170 ug/dL 29  UIBC Latest Units: ug/dL 281  TIBC Latest Ref Range: 250 - 450 ug/dL 310  Saturation Ratios Latest Ref Range: 10.4 - 31.8 % 9 (L)  Ferritin Latest Ref Range: 11 - 307 ng/mL 98     ASSESSMENT & PLAN:  Assessment/Plan:  A 80 y.o. female with anemia of chronic disease.  I am pleased as her hemoglobin  remains above 10.  When evaluating her iron parameters, her iron panel remains consistent with anemia of chronic disease, not iron deficiency anemia.  As mentioned previously, this patient has long-standing rheumatoid arthritis and is on methotrexate, which likely explains the pattern of results from her iron panel today.  I remain very concerned with her poorly controlled blood pressure.  She understands that if it remains this poorly controlled, she runs the risk of developing a cardiovascular insult.  Otherwise, I will see her back in 6 months for repeat clinical assessment.  The patient understands all the plans discussed today and is in agreement with them.  I, Rita Ohara, am acting as scribe for Marice Potter, MD    I have reviewed this report as typed by the medical scribe, and it is complete and accurate.  Dequincy Macarthur Critchley, MD

## 2020-12-16 ENCOUNTER — Telehealth: Payer: Self-pay | Admitting: Oncology

## 2020-12-16 NOTE — Telephone Encounter (Signed)
Patient rescheduled from 12/20 to 12/22 Labs, Follow Up beginning at 2:15 pm.

## 2020-12-20 ENCOUNTER — Ambulatory Visit: Payer: Medicare Other | Admitting: Oncology

## 2020-12-20 ENCOUNTER — Other Ambulatory Visit: Payer: Medicare Other

## 2020-12-21 NOTE — Progress Notes (Signed)
[facility]  [address] [place],  [place]  [address] [phone]  Clinic Day:  12/22/2020  Referring physician: Penelope Coop, FNP  This document serves as a record of services personally performed by Marice Potter, MD. It was created on their behalf by Curry,Lauren E, a trained medical scribe. The creation of this record is based on the scribe's personal observations and the provider's statements to them.  HISTORY OF PRESENT ILLNESS:  The patient is an 80 y.o. female with a history of anemia secondary to chronic disease.   In the past, she also had iron deficiency anemia where IV Feraheme was very effective in replenishing her iron stores.  She comes in today to reassess her hemoglobin.  Since her last visit, the patient claims to be doing fairly well.  She denies having increased fatigue or any overt forms of blood loss which concern her for progressive anemia.  Of note, she continues to take methotrexate for her rheumatoid arthritis.  PHYSICAL EXAM:  Blood pressure (!) 163/74, pulse 76, temperature 98.4 F (36.9 C), resp. rate 16, height 5\' 4"  (1.626 m), weight 167 lb 11.2 oz (76.1 kg), SpO2 91 %. Wt Readings from Last 3 Encounters:  12/22/20 167 lb 11.2 oz (76.1 kg)  05/02/20 178 lb 12.8 oz (81.1 kg)  05/28/17 150 lb (68 kg)   Body mass index is 28.79 kg/m. Performance status (ECOG): 1 Physical Exam Constitutional:      Appearance: Normal appearance. She is not ill-appearing.  HENT:     Mouth/Throat:     Mouth: Mucous membranes are moist.     Pharynx: Oropharynx is clear. No oropharyngeal exudate or posterior oropharyngeal erythema.  Cardiovascular:     Rate and Rhythm: Normal rate and regular rhythm.     Heart sounds: No murmur heard.   No friction rub. No gallop.  Pulmonary:     Effort: Pulmonary effort is normal. No respiratory distress.     Breath sounds: Normal breath sounds. No wheezing, rhonchi or rales.  Abdominal:      General: Bowel sounds are normal. There is no distension.     Palpations: Abdomen is soft. There is no mass.     Tenderness: There is no abdominal tenderness.  Musculoskeletal:        General: No swelling.     Right lower leg: No edema.     Left lower leg: No edema.  Lymphadenopathy:     Cervical: No cervical adenopathy.     Upper Body:     Right upper body: No supraclavicular or axillary adenopathy.     Left upper body: No supraclavicular or axillary adenopathy.     Lower Body: No right inguinal adenopathy. No left inguinal adenopathy.  Skin:    General: Skin is warm.     Coloration: Skin is not jaundiced.     Findings: No lesion or rash.  Neurological:     General: No focal deficit present.     Mental Status: She is alert and oriented to person, place, and time. Mental status is at baseline.  Psychiatric:        Mood and Affect: Mood normal.        Behavior: Behavior normal.        Thought Content: Thought content normal.   LABS:   CBC Latest Ref Rng & Units 12/22/2020 05/02/2020 04/26/2017  WBC - 7.6 6.8 13.1(H)  Hemoglobin 12.0 - 16.0 11.0(A) 10.5(A) 8.3(L)  Hematocrit 36 - 46  35(A) 33(A) 26.0(L)  Platelets 150 - 399 354 319 460(H)   CMP Latest Ref Rng & Units 12/22/2020 05/02/2020 04/26/2017  Glucose 65 - 99 mg/dL - - 95  BUN 4 - 21 15 13 7   Creatinine 0.5 - 1.1 0.9 0.6 0.73  Sodium 137 - 147 139 138 136  Potassium 3.4 - 5.3 4.0 4.1 3.0(L)  Chloride 99 - 108 99 104 101  CO2 13 - 22 31(A) 28(A) 24  Calcium 8.7 - 10.7 8.9 8.7 8.4(L)  Total Protein 6.5 - 8.1 g/dL - - -  Total Bilirubin 0.3 - 1.2 mg/dL - - -  Alkaline Phos 25 - 125 143(A) 163(A) -  AST 13 - 35 25 18 -  ALT 7 - 35 12 9 -    Latest Reference Range & Units 12/22/20 14:13  Iron 28 - 170 ug/dL 49  UIBC ug/dL 231  TIBC 250 - 450 ug/dL 280  Saturation Ratios 10.4 - 31.8 % 17  Ferritin 11 - 307 ng/mL 157   ASSESSMENT & PLAN:  Assessment/Plan:  A 80 y.o. female with anemia of chronic disease.  I am pleased  as her hemoglobin remains above 10.  In fact, her hemoglobin of 11 today is one of the highest levels she has had in numerous years.  From a hematologic standpoint, the patient is doing well.  Based upon this, I will see her back in 6 months for repeat clinical assessment.  The patient understands all the plans discussed today and is in agreement with them.   I, Rita Ohara, am acting as scribe for Marice Potter, MD    I have reviewed this report as typed by the medical scribe, and it is complete and accurate.  Dequincy Macarthur Critchley, MD

## 2020-12-22 ENCOUNTER — Other Ambulatory Visit: Payer: Self-pay | Admitting: Oncology

## 2020-12-22 ENCOUNTER — Telehealth: Payer: Self-pay | Admitting: Oncology

## 2020-12-22 ENCOUNTER — Inpatient Hospital Stay (INDEPENDENT_AMBULATORY_CARE_PROVIDER_SITE_OTHER): Payer: Medicare Other | Admitting: Oncology

## 2020-12-22 ENCOUNTER — Encounter: Payer: Self-pay | Admitting: Oncology

## 2020-12-22 ENCOUNTER — Inpatient Hospital Stay: Payer: Medicare Other | Attending: Oncology

## 2020-12-22 VITALS — BP 163/74 | HR 76 | Temp 98.4°F | Resp 16 | Ht 64.0 in | Wt 167.7 lb

## 2020-12-22 DIAGNOSIS — D638 Anemia in other chronic diseases classified elsewhere: Secondary | ICD-10-CM

## 2020-12-22 DIAGNOSIS — D631 Anemia in chronic kidney disease: Secondary | ICD-10-CM

## 2020-12-22 DIAGNOSIS — N189 Chronic kidney disease, unspecified: Secondary | ICD-10-CM

## 2020-12-22 DIAGNOSIS — D649 Anemia, unspecified: Secondary | ICD-10-CM | POA: Diagnosis present

## 2020-12-22 DIAGNOSIS — D509 Iron deficiency anemia, unspecified: Secondary | ICD-10-CM

## 2020-12-22 LAB — HEPATIC FUNCTION PANEL
ALT: 12 (ref 7–35)
AST: 25 (ref 13–35)
Alkaline Phosphatase: 143 — AB (ref 25–125)
Bilirubin, Total: 0.7

## 2020-12-22 LAB — BASIC METABOLIC PANEL
BUN: 15 (ref 4–21)
CO2: 31 — AB (ref 13–22)
Chloride: 99 (ref 99–108)
Creatinine: 0.9 (ref 0.5–1.1)
Glucose: 102
Potassium: 4 (ref 3.4–5.3)
Sodium: 139 (ref 137–147)

## 2020-12-22 LAB — IRON AND TIBC
Iron: 49 ug/dL (ref 28–170)
Saturation Ratios: 17 % (ref 10.4–31.8)
TIBC: 280 ug/dL (ref 250–450)
UIBC: 231 ug/dL

## 2020-12-22 LAB — COMPREHENSIVE METABOLIC PANEL
Albumin: 4.2 (ref 3.5–5.0)
Calcium: 8.9 (ref 8.7–10.7)

## 2020-12-22 LAB — CBC AND DIFFERENTIAL
HCT: 35 — AB (ref 36–46)
Hemoglobin: 11 — AB (ref 12.0–16.0)
Neutrophils Absolute: 5.09
Platelets: 354 (ref 150–399)
WBC: 7.6

## 2020-12-22 LAB — FERRITIN: Ferritin: 157 ng/mL (ref 11–307)

## 2020-12-22 LAB — CBC: RBC: 3.98 (ref 3.87–5.11)

## 2020-12-22 NOTE — Telephone Encounter (Signed)
Per 12/22 LOS, patient scheduled for June 2023 Appt's.  Gave patient Appt Summary

## 2021-02-24 DIAGNOSIS — E785 Hyperlipidemia, unspecified: Secondary | ICD-10-CM | POA: Diagnosis not present

## 2021-02-24 DIAGNOSIS — I10 Essential (primary) hypertension: Secondary | ICD-10-CM | POA: Diagnosis not present

## 2021-02-24 DIAGNOSIS — K922 Gastrointestinal hemorrhage, unspecified: Secondary | ICD-10-CM | POA: Diagnosis not present

## 2021-02-24 DIAGNOSIS — I480 Paroxysmal atrial fibrillation: Secondary | ICD-10-CM | POA: Diagnosis not present

## 2021-02-24 DIAGNOSIS — D649 Anemia, unspecified: Secondary | ICD-10-CM | POA: Diagnosis not present

## 2021-03-01 DIAGNOSIS — K2100 Gastro-esophageal reflux disease with esophagitis, without bleeding: Secondary | ICD-10-CM | POA: Diagnosis not present

## 2021-03-01 DIAGNOSIS — I4891 Unspecified atrial fibrillation: Secondary | ICD-10-CM | POA: Diagnosis not present

## 2021-03-01 DIAGNOSIS — E538 Deficiency of other specified B group vitamins: Secondary | ICD-10-CM | POA: Diagnosis not present

## 2021-03-01 DIAGNOSIS — E0590 Thyrotoxicosis, unspecified without thyrotoxic crisis or storm: Secondary | ICD-10-CM | POA: Diagnosis not present

## 2021-03-01 DIAGNOSIS — I10 Essential (primary) hypertension: Secondary | ICD-10-CM | POA: Diagnosis not present

## 2021-03-01 DIAGNOSIS — I50810 Right heart failure, unspecified: Secondary | ICD-10-CM | POA: Diagnosis not present

## 2021-03-01 DIAGNOSIS — Z79899 Other long term (current) drug therapy: Secondary | ICD-10-CM | POA: Diagnosis not present

## 2021-03-01 DIAGNOSIS — E559 Vitamin D deficiency, unspecified: Secondary | ICD-10-CM | POA: Diagnosis not present

## 2021-03-01 DIAGNOSIS — J449 Chronic obstructive pulmonary disease, unspecified: Secondary | ICD-10-CM | POA: Diagnosis not present

## 2021-03-01 DIAGNOSIS — M069 Rheumatoid arthritis, unspecified: Secondary | ICD-10-CM | POA: Diagnosis not present

## 2021-03-01 DIAGNOSIS — E785 Hyperlipidemia, unspecified: Secondary | ICD-10-CM | POA: Diagnosis not present

## 2021-04-11 DIAGNOSIS — M0579 Rheumatoid arthritis with rheumatoid factor of multiple sites without organ or systems involvement: Secondary | ICD-10-CM | POA: Diagnosis not present

## 2021-04-11 DIAGNOSIS — M25561 Pain in right knee: Secondary | ICD-10-CM | POA: Diagnosis not present

## 2021-04-11 DIAGNOSIS — Z6827 Body mass index (BMI) 27.0-27.9, adult: Secondary | ICD-10-CM | POA: Diagnosis not present

## 2021-04-11 DIAGNOSIS — E663 Overweight: Secondary | ICD-10-CM | POA: Diagnosis not present

## 2021-04-11 DIAGNOSIS — M1991 Primary osteoarthritis, unspecified site: Secondary | ICD-10-CM | POA: Diagnosis not present

## 2021-06-21 NOTE — Progress Notes (Signed)
[facility]  [address] [place],  [place]  [address] [phone]  Clinic Day:  06/22/2021  Referring physician: Penelope Coop, FNP  HISTORY OF PRESENT ILLNESS:  The patient is an 81 y.o. female with a history of anemia secondary to chronic disease.   In the past, she also had iron deficiency anemia where IV Feraheme was very effective in replenishing her iron stores.  She comes in today to reassess her hemoglobin.  Since her last visit, the patient claims to be doing fairly well.  She denies having increased fatigue or any overt forms of blood loss which concern her for progressive anemia.  Of note, she continues to take methotrexate for her rheumatoid arthritis.  PHYSICAL EXAM:  Blood pressure (!) 208/93, pulse 80, temperature 98.3 F (36.8 C), resp. rate 16, height '5\' 4"'$  (1.626 m), weight 171 lb (77.6 kg), SpO2 90 %. Wt Readings from Last 3 Encounters:  07/03/21 169 lb 12 oz (77 kg)  06/22/21 171 lb (77.6 kg)  12/22/20 167 lb 11.2 oz (76.1 kg)   Body mass index is 29.35 kg/m. Performance status (ECOG): 1 Physical Exam Constitutional:      Appearance: Normal appearance. She is not ill-appearing.  HENT:     Mouth/Throat:     Mouth: Mucous membranes are moist.     Pharynx: Oropharynx is clear. No oropharyngeal exudate or posterior oropharyngeal erythema.  Cardiovascular:     Rate and Rhythm: Normal rate and regular rhythm.     Heart sounds: No murmur heard.    No friction rub. No gallop.  Pulmonary:     Effort: Pulmonary effort is normal. No respiratory distress.     Breath sounds: Normal breath sounds. No wheezing, rhonchi or rales.  Abdominal:     General: Bowel sounds are normal. There is no distension.     Palpations: Abdomen is soft. There is no mass.     Tenderness: There is no abdominal tenderness.  Musculoskeletal:        General: No swelling.     Right lower leg: No edema.     Left lower leg: No edema.  Lymphadenopathy:      Cervical: No cervical adenopathy.     Upper Body:     Right upper body: No supraclavicular or axillary adenopathy.     Left upper body: No supraclavicular or axillary adenopathy.     Lower Body: No right inguinal adenopathy. No left inguinal adenopathy.  Skin:    General: Skin is warm.     Coloration: Skin is not jaundiced.     Findings: No lesion or rash.  Neurological:     General: No focal deficit present.     Mental Status: She is alert and oriented to person, place, and time. Mental status is at baseline.  Psychiatric:        Mood and Affect: Mood normal.        Behavior: Behavior normal.        Thought Content: Thought content normal.    LABS:      Latest Ref Rng & Units 06/22/2021   12:00 AM 12/22/2020   12:00 AM 05/02/2020   12:00 AM  CBC  WBC  7.5     7.6  6.8      Hemoglobin 12.0 - 16.0 9.9     11.0  10.5      Hematocrit 36 - 46 31     35  33  Platelets 150 - 400 K/uL 288     354  319         This result is from an external source.      Latest Ref Rng & Units 06/22/2021   12:00 AM 12/22/2020   12:00 AM 05/02/2020   12:00 AM  CMP  BUN 4 - '21 14     15  13      '$ Creatinine 0.5 - 1.1 0.6     0.9  0.6      Sodium 137 - 147 136     139  138      Potassium 3.5 - 5.1 mEq/L 3.9     4.0  4.1      Chloride 99 - 108 22     99  104      CO2 13 - '22 22     31  28      '$ Calcium 8.7 - 10.7 8.6     8.9  8.7      Alkaline Phos 25 - 125 124     143  163      AST 13 - 35 '21     25  18      '$ ALT 7 - 35 U/L '10     12  9         '$ This result is from an external source.    Latest Reference Range & Units 06/22/21 10:00 06/22/21 11:36  Iron 28 - 170 ug/dL 32   UIBC ug/dL 257   TIBC 250 - 450 ug/dL 289   Saturation Ratios 10.4 - 31.8 % 11   Ferritin 11 - 307 ng/mL  109  Folate >5.9 ng/mL  27.0    Latest Reference Range & Units 06/22/21 11:36  Vitamin B12 180 - 914 pg/mL 190   ASSESSMENT & PLAN:  Assessment/Plan:  A 81 y.o. female with anemia of chronic disease.  Since her  last visit, the patient's hemoglobin has mildly fallen.  When evaluating all of her labs today, her vitamin B12 is borderline low.  Based upon this, I will arrange for her to receive weekly B12 injections for 4 weeks, followed by monthly B12 injections over time.  The hope is these shots will lead to an improvement in her baseline hemoglobin over time.  I will see her back in 3 months to reassess her anemia.  The patient understands all the plans discussed today and is in agreement with them.   Abdulahad Mederos Macarthur Critchley, MD

## 2021-06-22 ENCOUNTER — Inpatient Hospital Stay: Payer: Medicare HMO | Admitting: Oncology

## 2021-06-22 ENCOUNTER — Inpatient Hospital Stay: Payer: Medicare HMO | Attending: Oncology

## 2021-06-22 ENCOUNTER — Other Ambulatory Visit: Payer: Self-pay | Admitting: Oncology

## 2021-06-22 ENCOUNTER — Telehealth: Payer: Self-pay | Admitting: Oncology

## 2021-06-22 DIAGNOSIS — M069 Rheumatoid arthritis, unspecified: Secondary | ICD-10-CM | POA: Insufficient documentation

## 2021-06-22 DIAGNOSIS — E538 Deficiency of other specified B group vitamins: Secondary | ICD-10-CM | POA: Diagnosis not present

## 2021-06-22 DIAGNOSIS — D649 Anemia, unspecified: Secondary | ICD-10-CM | POA: Diagnosis not present

## 2021-06-22 DIAGNOSIS — D631 Anemia in chronic kidney disease: Secondary | ICD-10-CM | POA: Diagnosis not present

## 2021-06-22 DIAGNOSIS — D509 Iron deficiency anemia, unspecified: Secondary | ICD-10-CM | POA: Diagnosis not present

## 2021-06-22 DIAGNOSIS — N189 Chronic kidney disease, unspecified: Secondary | ICD-10-CM | POA: Diagnosis not present

## 2021-06-22 LAB — BASIC METABOLIC PANEL
BUN: 14 (ref 4–21)
CO2: 22 (ref 13–22)
Chloride: 22 — AB (ref 99–108)
Creatinine: 0.6 (ref 0.5–1.1)
Glucose: 94
Potassium: 3.9 mEq/L (ref 3.5–5.1)
Sodium: 136 — AB (ref 137–147)

## 2021-06-22 LAB — IRON AND TIBC
Iron: 32 ug/dL (ref 28–170)
Saturation Ratios: 11 % (ref 10.4–31.8)
TIBC: 289 ug/dL (ref 250–450)
UIBC: 257 ug/dL

## 2021-06-22 LAB — CBC AND DIFFERENTIAL
HCT: 31 — AB (ref 36–46)
Hemoglobin: 9.9 — AB (ref 12.0–16.0)
Neutrophils Absolute: 5.4
Platelets: 288 10*3/uL (ref 150–400)
WBC: 7.5

## 2021-06-22 LAB — HEPATIC FUNCTION PANEL
ALT: 10 U/L (ref 7–35)
AST: 21 (ref 13–35)
Alkaline Phosphatase: 124 (ref 25–125)
Bilirubin, Total: 0.9

## 2021-06-22 LAB — COMPREHENSIVE METABOLIC PANEL
Albumin: 4.1 (ref 3.5–5.0)
Calcium: 8.6 — AB (ref 8.7–10.7)

## 2021-06-22 LAB — FERRITIN: Ferritin: 109 ng/mL (ref 11–307)

## 2021-06-22 LAB — CBC: RBC: 3.57 — AB (ref 3.87–5.11)

## 2021-06-22 LAB — FOLATE: Folate: 27 ng/mL (ref >5.9)

## 2021-06-22 LAB — VITAMIN B12: Vitamin B-12: 190 pg/mL (ref 180–914)

## 2021-06-22 NOTE — Telephone Encounter (Signed)
Per 06/22/21 los next appt scheduled and confirmed with patient 

## 2021-07-03 ENCOUNTER — Other Ambulatory Visit: Payer: Self-pay | Admitting: Pharmacist

## 2021-07-03 ENCOUNTER — Inpatient Hospital Stay: Payer: Medicare HMO | Attending: Oncology

## 2021-07-03 VITALS — BP 166/76 | HR 66 | Temp 98.0°F | Resp 18 | Ht 64.0 in | Wt 169.8 lb

## 2021-07-03 DIAGNOSIS — E538 Deficiency of other specified B group vitamins: Secondary | ICD-10-CM

## 2021-07-03 MED ORDER — CYANOCOBALAMIN 1000 MCG/ML IJ SOLN
1000.0000 ug | Freq: Once | INTRAMUSCULAR | Status: AC
  Administered 2021-07-03: 1000 ug via INTRAMUSCULAR
  Filled 2021-07-03: qty 1

## 2021-07-03 NOTE — Patient Instructions (Signed)
Vitamin B12 Injection What is this medication? Vitamin B12 (VAHY tuh min B12) prevents and treats low vitamin B12 levels in your body. It is used in people who do not get enough vitamin B12 from their diet or when their digestive tract does not absorb enough. Vitamin B12 plays an important role in maintaining the health of your nervous system and red blood cells. This medicine may be used for other purposes; ask your health care provider or pharmacist if you have questions. COMMON BRAND NAME(S): B-12 Compliance Kit, B-12 Injection Kit, Cyomin, Dodex, LA-12, Nutri-Twelve, Physicians EZ Use B-12, Primabalt What should I tell my care team before I take this medication? They need to know if you have any of these conditions: Kidney disease Leber's disease Megaloblastic anemia An unusual or allergic reaction to cyanocobalamin, cobalt, other medications, foods, dyes, or preservatives Pregnant or trying to get pregnant Breast-feeding How should I use this medication? This medication is injected into a muscle or deeply under the skin. It is usually given in a clinic or care team's office. However, your care team may teach you how to inject yourself. Follow all instructions. Talk to your care team about the use of this medication in children. Special care may be needed. Overdosage: If you think you have taken too much of this medicine contact a poison control center or emergency room at once. NOTE: This medicine is only for you. Do not share this medicine with others. What if I miss a dose? If you are given your dose at a clinic or care team's office, call to reschedule your appointment. If you give your own injections, and you miss a dose, take it as soon as you can. If it is almost time for your next dose, take only that dose. Do not take double or extra doses. What may interact with this medication? Alcohol Colchicine This list may not describe all possible interactions. Give your health care  provider a list of all the medicines, herbs, non-prescription drugs, or dietary supplements you use. Also tell them if you smoke, drink alcohol, or use illegal drugs. Some items may interact with your medicine. What should I watch for while using this medication? Visit your care team regularly. You may need blood work done while you are taking this medication. You may need to follow a special diet. Talk to your care team. Limit your alcohol intake and avoid smoking to get the best benefit. What side effects may I notice from receiving this medication? Side effects that you should report to your care team as soon as possible: Allergic reactions--skin rash, itching, hives, swelling of the face, lips, tongue, or throat Swelling of the ankles, hands, or feet Trouble breathing Side effects that usually do not require medical attention (report to your care team if they continue or are bothersome): Diarrhea This list may not describe all possible side effects. Call your doctor for medical advice about side effects. You may report side effects to FDA at 1-800-FDA-1088. Where should I keep my medication? Keep out of the reach of children. Store at room temperature between 15 and 30 degrees C (59 and 85 degrees F). Protect from light. Throw away any unused medication after the expiration date. NOTE: This sheet is a summary. It may not cover all possible information. If you have questions about this medicine, talk to your doctor, pharmacist, or health care provider.  2023 Elsevier/Gold Standard (2020-08-30 00:00:00)

## 2021-07-05 ENCOUNTER — Encounter: Payer: Self-pay | Admitting: Oncology

## 2021-07-10 ENCOUNTER — Inpatient Hospital Stay: Payer: Medicare HMO

## 2021-07-10 VITALS — BP 195/75 | HR 58 | Resp 18 | Ht 64.0 in | Wt 167.8 lb

## 2021-07-10 DIAGNOSIS — E538 Deficiency of other specified B group vitamins: Secondary | ICD-10-CM | POA: Diagnosis not present

## 2021-07-10 MED ORDER — CYANOCOBALAMIN 1000 MCG/ML IJ SOLN
1000.0000 ug | Freq: Once | INTRAMUSCULAR | Status: AC
  Administered 2021-07-10: 1000 ug via INTRAMUSCULAR
  Filled 2021-07-10: qty 1

## 2021-07-10 NOTE — Patient Instructions (Signed)
Vitamin B12 Injection What is this medication? Vitamin B12 (VAHY tuh min B12) prevents and treats low vitamin B12 levels in your body. It is used in people who do not get enough vitamin B12 from their diet or when their digestive tract does not absorb enough. Vitamin B12 plays an important role in maintaining the health of your nervous system and red blood cells. This medicine may be used for other purposes; ask your health care provider or pharmacist if you have questions. COMMON BRAND NAME(S): B-12 Compliance Kit, B-12 Injection Kit, Cyomin, Dodex, LA-12, Nutri-Twelve, Physicians EZ Use B-12, Primabalt What should I tell my care team before I take this medication? They need to know if you have any of these conditions: Kidney disease Leber's disease Megaloblastic anemia An unusual or allergic reaction to cyanocobalamin, cobalt, other medications, foods, dyes, or preservatives Pregnant or trying to get pregnant Breast-feeding How should I use this medication? This medication is injected into a muscle or deeply under the skin. It is usually given in a clinic or care team's office. However, your care team may teach you how to inject yourself. Follow all instructions. Talk to your care team about the use of this medication in children. Special care may be needed. Overdosage: If you think you have taken too much of this medicine contact a poison control center or emergency room at once. NOTE: This medicine is only for you. Do not share this medicine with others. What if I miss a dose? If you are given your dose at a clinic or care team's office, call to reschedule your appointment. If you give your own injections, and you miss a dose, take it as soon as you can. If it is almost time for your next dose, take only that dose. Do not take double or extra doses. What may interact with this medication? Alcohol Colchicine This list may not describe all possible interactions. Give your health care  provider a list of all the medicines, herbs, non-prescription drugs, or dietary supplements you use. Also tell them if you smoke, drink alcohol, or use illegal drugs. Some items may interact with your medicine. What should I watch for while using this medication? Visit your care team regularly. You may need blood work done while you are taking this medication. You may need to follow a special diet. Talk to your care team. Limit your alcohol intake and avoid smoking to get the best benefit. What side effects may I notice from receiving this medication? Side effects that you should report to your care team as soon as possible: Allergic reactions--skin rash, itching, hives, swelling of the face, lips, tongue, or throat Swelling of the ankles, hands, or feet Trouble breathing Side effects that usually do not require medical attention (report to your care team if they continue or are bothersome): Diarrhea This list may not describe all possible side effects. Call your doctor for medical advice about side effects. You may report side effects to FDA at 1-800-FDA-1088. Where should I keep my medication? Keep out of the reach of children. Store at room temperature between 15 and 30 degrees C (59 and 85 degrees F). Protect from light. Throw away any unused medication after the expiration date. NOTE: This sheet is a summary. It may not cover all possible information. If you have questions about this medicine, talk to your doctor, pharmacist, or health care provider.  2023 Elsevier/Gold Standard (2020-08-30 00:00:00)

## 2021-07-10 NOTE — Progress Notes (Signed)
Pt does not have current list of medication and is unsure of the meds she is taking.  States her sister usually takes care of that for her.  Sister was in waiting room.  Discussed with sister at discharge about bringing a current list of pt's meds at next visit.  Sister verbalizes understanding.

## 2021-07-11 DIAGNOSIS — M1990 Unspecified osteoarthritis, unspecified site: Secondary | ICD-10-CM | POA: Diagnosis not present

## 2021-07-11 DIAGNOSIS — Z79631 Long term (current) use of antimetabolite agent: Secondary | ICD-10-CM | POA: Diagnosis not present

## 2021-07-11 DIAGNOSIS — R32 Unspecified urinary incontinence: Secondary | ICD-10-CM | POA: Diagnosis not present

## 2021-07-11 DIAGNOSIS — E785 Hyperlipidemia, unspecified: Secondary | ICD-10-CM | POA: Diagnosis not present

## 2021-07-11 DIAGNOSIS — J45909 Unspecified asthma, uncomplicated: Secondary | ICD-10-CM | POA: Diagnosis not present

## 2021-07-11 DIAGNOSIS — I4891 Unspecified atrial fibrillation: Secondary | ICD-10-CM | POA: Diagnosis not present

## 2021-07-11 DIAGNOSIS — K219 Gastro-esophageal reflux disease without esophagitis: Secondary | ICD-10-CM | POA: Diagnosis not present

## 2021-07-11 DIAGNOSIS — M069 Rheumatoid arthritis, unspecified: Secondary | ICD-10-CM | POA: Diagnosis not present

## 2021-07-11 DIAGNOSIS — Z9181 History of falling: Secondary | ICD-10-CM | POA: Diagnosis not present

## 2021-07-11 DIAGNOSIS — I10 Essential (primary) hypertension: Secondary | ICD-10-CM | POA: Diagnosis not present

## 2021-07-17 ENCOUNTER — Inpatient Hospital Stay: Payer: Medicare HMO

## 2021-07-17 VITALS — BP 180/90 | HR 63 | Temp 97.4°F | Resp 20 | Wt 179.0 lb

## 2021-07-17 DIAGNOSIS — E538 Deficiency of other specified B group vitamins: Secondary | ICD-10-CM

## 2021-07-17 MED ORDER — CYANOCOBALAMIN 1000 MCG/ML IJ SOLN
1000.0000 ug | Freq: Once | INTRAMUSCULAR | Status: AC
  Administered 2021-07-17: 1000 ug via INTRAMUSCULAR
  Filled 2021-07-17: qty 1

## 2021-07-17 NOTE — Patient Instructions (Signed)
Vitamin B12 Injection What is this medication? Vitamin B12 (VAHY tuh min B12) prevents and treats low vitamin B12 levels in your body. It is used in people who do not get enough vitamin B12 from their diet or when their digestive tract does not absorb enough. Vitamin B12 plays an important role in maintaining the health of your nervous system and red blood cells. This medicine may be used for other purposes; ask your health care provider or pharmacist if you have questions. COMMON BRAND NAME(S): B-12 Compliance Kit, B-12 Injection Kit, Cyomin, Dodex, LA-12, Nutri-Twelve, Physicians EZ Use B-12, Primabalt What should I tell my care team before I take this medication? They need to know if you have any of these conditions: Kidney disease Leber's disease Megaloblastic anemia An unusual or allergic reaction to cyanocobalamin, cobalt, other medications, foods, dyes, or preservatives Pregnant or trying to get pregnant Breast-feeding How should I use this medication? This medication is injected into a muscle or deeply under the skin. It is usually given in a clinic or care team's office. However, your care team may teach you how to inject yourself. Follow all instructions. Talk to your care team about the use of this medication in children. Special care may be needed. Overdosage: If you think you have taken too much of this medicine contact a poison control center or emergency room at once. NOTE: This medicine is only for you. Do not share this medicine with others. What if I miss a dose? If you are given your dose at a clinic or care team's office, call to reschedule your appointment. If you give your own injections, and you miss a dose, take it as soon as you can. If it is almost time for your next dose, take only that dose. Do not take double or extra doses. What may interact with this medication? Alcohol Colchicine This list may not describe all possible interactions. Give your health care  provider a list of all the medicines, herbs, non-prescription drugs, or dietary supplements you use. Also tell them if you smoke, drink alcohol, or use illegal drugs. Some items may interact with your medicine. What should I watch for while using this medication? Visit your care team regularly. You may need blood work done while you are taking this medication. You may need to follow a special diet. Talk to your care team. Limit your alcohol intake and avoid smoking to get the best benefit. What side effects may I notice from receiving this medication? Side effects that you should report to your care team as soon as possible: Allergic reactions--skin rash, itching, hives, swelling of the face, lips, tongue, or throat Swelling of the ankles, hands, or feet Trouble breathing Side effects that usually do not require medical attention (report to your care team if they continue or are bothersome): Diarrhea This list may not describe all possible side effects. Call your doctor for medical advice about side effects. You may report side effects to FDA at 1-800-FDA-1088. Where should I keep my medication? Keep out of the reach of children. Store at room temperature between 15 and 30 degrees C (59 and 85 degrees F). Protect from light. Throw away any unused medication after the expiration date. NOTE: This sheet is a summary. It may not cover all possible information. If you have questions about this medicine, talk to your doctor, pharmacist, or health care provider.  2023 Elsevier/Gold Standard (2020-08-30 00:00:00)

## 2021-07-24 ENCOUNTER — Inpatient Hospital Stay: Payer: Medicare HMO

## 2021-07-24 VITALS — BP 172/82 | HR 62 | Temp 98.2°F | Resp 18 | Ht 64.0 in | Wt 171.0 lb

## 2021-07-24 DIAGNOSIS — E538 Deficiency of other specified B group vitamins: Secondary | ICD-10-CM

## 2021-07-24 MED ORDER — CYANOCOBALAMIN 1000 MCG/ML IJ SOLN
1000.0000 ug | Freq: Once | INTRAMUSCULAR | Status: AC
  Administered 2021-07-24: 1000 ug via INTRAMUSCULAR
  Filled 2021-07-24: qty 1

## 2021-07-24 NOTE — Patient Instructions (Signed)
Vitamin B12 Deficiency Vitamin B12 deficiency occurs when the body does not have enough of this important vitamin. The body needs this vitamin: To make red blood cells. To make DNA. This is the genetic material inside cells. To help the nerves work properly so they can carry messages from the brain to the body. Vitamin B12 deficiency can cause health problems, such as not having enough red blood cells in the blood (anemia). This can lead to nerve damage if untreated. What are the causes? This condition may be caused by: Not eating enough foods that contain vitamin B12. Not having enough stomach acid and digestive fluids to properly absorb vitamin B12 from the food that you eat. Having certain diseases that make it hard to absorb vitamin B12. These diseases include Crohn's disease, chronic pancreatitis, and cystic fibrosis. An autoimmune disorder in which the body does not make enough of a protein (intrinsic factor) within the stomach, resulting in not enough absorption of vitamin B12. Having a surgery in which part of the stomach or small intestine is removed. Taking certain medicines that make it hard for the body to absorb vitamin B12. These include: Heartburn medicines, such as antacids and proton pump inhibitors. Some medicines that are used to treat diabetes. What increases the risk? The following factors may make you more likely to develop a vitamin B12 deficiency: Being an older adult. Eating a vegetarian or vegan diet that does not include any foods that come from animals. Eating a poor diet while you are pregnant. Taking certain medicines. Having alcoholism. What are the signs or symptoms? In some cases, there are no symptoms of this condition. If the condition leads to anemia or nerve damage, various symptoms may occur, such as: Weakness. Tiredness (fatigue). Loss of appetite. Numbness or tingling in your hands and feet. Redness and burning of the tongue. Depression,  confusion, or memory problems. Trouble walking. If anemia is severe, symptoms can include: Shortness of breath. Dizziness. Rapid heart rate. How is this diagnosed? This condition may be diagnosed with a blood test to measure the level of vitamin B12 in your blood. You may also have other tests, including: A group of tests that measure certain characteristics of blood cells (complete blood count, CBC). A blood test to measure intrinsic factor. A procedure where a thin tube with a camera on the end is used to look into your stomach or intestines (endoscopy). Other tests may be needed to discover the cause of the deficiency. How is this treated? Treatment for this condition depends on the cause. This condition may be treated by: Changing your eating and drinking habits, such as: Eating more foods that contain vitamin B12. Drinking less alcohol or no alcohol. Getting vitamin B12 injections. Taking vitamin B12 supplements by mouth (orally). Your health care provider will tell you which dose is best for you. Follow these instructions at home: Eating and drinking  Include foods in your diet that come from animals and contain a lot of vitamin B12. These include: Meats and poultry. This includes beef, pork, chicken, turkey, and organ meats, such as liver. Seafood. This includes clams, rainbow trout, salmon, tuna, and haddock. Eggs. Dairy foods such as milk, yogurt, and cheese. Eat foods that have vitamin B12 added to them (are fortified), such as ready-to-eat breakfast cereals. Check the label on the package to see if a food is fortified. The items listed above may not be a complete list of foods and beverages you can eat and drink. Contact a dietitian for   more information. Alcohol use Do not drink alcohol if: Your health care provider tells you not to drink. You are pregnant, may be pregnant, or are planning to become pregnant. If you drink alcohol: Limit how much you have to: 0-1 drink a  day for women. 0-2 drinks a day for men. Know how much alcohol is in your drink. In the [place] one drink equals one 12 oz bottle of beer (355 mL), one 5 oz glass of wine (148 mL), or one 1 oz glass of hard liquor (44 mL). General instructions Get vitamin B12 injections if told to by your health care provider. Take supplements only as told by your health care provider. Follow the directions carefully. Keep all follow-up visits. This is important. Contact a health care provider if: Your symptoms come back. Your symptoms get worse or do not improve with treatment. Get help right away: You develop shortness of breath. You have a rapid heart rate. You have chest pain. You become dizzy or you faint. These symptoms may be an emergency. Get help right away. Call 911. Do not wait to see if the symptoms will go away. Do not drive yourself to the hospital. Summary Vitamin B12 deficiency occurs when the body does not have enough of this important vitamin. Common causes include not eating enough foods that contain vitamin B12, not being able to absorb vitamin B12 from the food that you eat, having a surgery in which part of the stomach or small intestine is removed, or taking certain medicines. Eat foods that have vitamin B12 in them. Treatment may include making a change in the way you eat and drink, getting vitamin B12 injections, or taking vitamin B12 supplements. This information is not intended to replace advice given to you by your health care provider. Make sure you discuss any questions you have with your health care provider. Document Revised: 08/12/2020 Document Reviewed: 08/12/2020 Elsevier Patient Education  2023 Elsevier Inc.  

## 2021-08-21 ENCOUNTER — Inpatient Hospital Stay: Payer: Medicare HMO | Attending: Oncology

## 2021-08-25 DIAGNOSIS — Z1231 Encounter for screening mammogram for malignant neoplasm of breast: Secondary | ICD-10-CM | POA: Diagnosis not present

## 2021-09-05 DIAGNOSIS — D0501 Lobular carcinoma in situ of right breast: Secondary | ICD-10-CM | POA: Diagnosis not present

## 2021-09-15 ENCOUNTER — Other Ambulatory Visit: Payer: Self-pay | Admitting: Pharmacist

## 2021-09-18 ENCOUNTER — Ambulatory Visit: Payer: Medicare HMO

## 2021-09-21 ENCOUNTER — Encounter: Payer: Self-pay | Admitting: Oncology

## 2021-09-21 NOTE — Progress Notes (Signed)
[facility]  [address] [place],  [place]  [address] [phone]  Clinic Day:  09/22/2021  Referring physician: Earlyne Iba, NP  HISTORY OF PRESENT ILLNESS:  The patient is an 81 y.o. female with a history of anemia secondary to chronic disease.   In the past, she also had iron deficiency anemia where IV Feraheme was very effective in replenishing her iron stores.  She comes in today to reassess her hemoglobin.  Since her last visit, the patient claims to be doing fairly well.  She denies having increased fatigue or any overt forms of blood loss which concern her for progressive anemia.  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  PHYSICAL EXAM:  Blood pressure (!) 185/85, pulse 74, temperature 97.9 F (36.6 C), resp. rate 20, height '5\' 4"'$  (1.626 m), weight 167 lb 4.8 oz (75.9 kg), SpO2 90 %. Wt Readings from Last 3 Encounters:  09/22/21 167 lb 4.8 oz (75.9 kg)  07/24/21 171 lb (77.6 kg)  07/17/21 179 lb 0.6 oz (81.2 kg)   Body mass index is 28.72 kg/m. Performance status (ECOG): 1 Physical Exam Constitutional:      Appearance: Normal appearance. She is not ill-appearing.  HENT:     Mouth/Throat:     Mouth: Mucous membranes are moist.     Pharynx: Oropharynx is clear. No oropharyngeal exudate or posterior oropharyngeal erythema.  Cardiovascular:     Rate and Rhythm: Normal rate and regular rhythm.     Heart sounds: No murmur heard.    No friction rub. No gallop.  Pulmonary:     Effort: Pulmonary effort is normal. No respiratory distress.     Breath sounds: Normal breath sounds. No wheezing, rhonchi or rales.  Abdominal:     General: Bowel sounds are normal. There is no distension.     Palpations: Abdomen is soft. There is no mass.     Tenderness: There is no abdominal tenderness.  Musculoskeletal:        General: No swelling.     Right lower leg: No edema.     Left lower leg: No edema.   Lymphadenopathy:     Cervical: No cervical adenopathy.     Upper Body:     Right upper body: No supraclavicular or axillary adenopathy.     Left upper body: No supraclavicular or axillary adenopathy.     Lower Body: No right inguinal adenopathy. No left inguinal adenopathy.  Skin:    General: Skin is warm.     Coloration: Skin is not jaundiced.     Findings: No lesion or rash.  Neurological:     General: No focal deficit present.     Mental Status: She is alert and oriented to person, place, and time. Mental status is at baseline.  Psychiatric:        Mood and Affect: Mood normal.        Behavior: Behavior normal.        Thought Content: Thought content normal.   LABS:      Latest Ref Rng & Units 09/22/2021   12:00 AM 06/22/2021   12:00 AM 12/22/2020   12:00 AM  CBC  WBC  7.8     7.5     7.6   Hemoglobin 12.0 - 16.0 8.9     9.9     11.0   Hematocrit 36 - 46 27     31     35  Platelets 150 - 400 K/uL 385     288     354      This result is from an external source.      Latest Ref Rng & Units 09/22/2021   12:00 AM 06/22/2021   12:00 AM 12/22/2020   12:00 AM  CMP  BUN 4 - '21 12     14     15   '$ Creatinine 0.5 - 1.1 0.7     0.6     0.9   Sodium 137 - 147 139     136     139   Potassium 3.5 - 5.1 mEq/L 3.8     3.9     4.0   Chloride 99 - 108 106     22     99   CO2 13 - '22 24     22     31   '$ Calcium 8.7 - 10.7 8.9     8.6     8.9   Alkaline Phos 25 - 125 109     124     143   AST 13 - 35 '24     21     25   '$ ALT 7 - 35 U/L '14     10     12      '$ This result is from an external source.    Latest Reference Range & Units 09/22/21 10:12 09/22/21 11:05  Iron 28 - 170 ug/dL 34   UIBC ug/dL 260   TIBC 250 - 450 ug/dL 294   Saturation Ratios 10.4 - 31.8 % 12   Ferritin 11 - 307 ng/mL 129   Folate >5.9 ng/mL  33.4  Vitamin B12 180 - 914 pg/mL  367   ASSESSMENT & PLAN:  Assessment/Plan:  A 81 y.o. female with anemia of chronic disease.  Her hemoglobin of 8.9 is lower than  what it has been previously.  Furthermore, none of her nutritional labs is particularly low.  She has had vitamin B12 deficiency in the past, but it has normalized since receiving her B12 injections.  As her hemoglobin is significantly below 10, I will arrange for her to receive Retacrit 20,000 units on a monthly basis.  I will see her back in 3 months to see how well she responded to her upcoming monthly Retacrit injections.  The patient understands all the plans discussed today and is in agreement with them.   Sanyiah Kanzler Macarthur Critchley, MD

## 2021-09-22 ENCOUNTER — Inpatient Hospital Stay: Payer: Medicare HMO | Attending: Oncology | Admitting: Oncology

## 2021-09-22 ENCOUNTER — Telehealth: Payer: Self-pay

## 2021-09-22 ENCOUNTER — Ambulatory Visit: Payer: Medicare HMO

## 2021-09-22 ENCOUNTER — Other Ambulatory Visit: Payer: Self-pay | Admitting: Oncology

## 2021-09-22 ENCOUNTER — Inpatient Hospital Stay: Payer: Medicare HMO

## 2021-09-22 VITALS — BP 185/85 | HR 74 | Temp 97.9°F | Resp 20 | Ht 64.0 in | Wt 167.3 lb

## 2021-09-22 DIAGNOSIS — E538 Deficiency of other specified B group vitamins: Secondary | ICD-10-CM | POA: Diagnosis not present

## 2021-09-22 DIAGNOSIS — D638 Anemia in other chronic diseases classified elsewhere: Secondary | ICD-10-CM

## 2021-09-22 DIAGNOSIS — D509 Iron deficiency anemia, unspecified: Secondary | ICD-10-CM | POA: Diagnosis not present

## 2021-09-22 DIAGNOSIS — D649 Anemia, unspecified: Secondary | ICD-10-CM | POA: Diagnosis not present

## 2021-09-22 DIAGNOSIS — R718 Other abnormality of red blood cells: Secondary | ICD-10-CM

## 2021-09-22 DIAGNOSIS — D631 Anemia in chronic kidney disease: Secondary | ICD-10-CM

## 2021-09-22 LAB — CBC AND DIFFERENTIAL
HCT: 27 — AB (ref 36–46)
Hemoglobin: 8.9 — AB (ref 12.0–16.0)
Neutrophils Absolute: 5.54
Platelets: 385 10*3/uL (ref 150–400)
WBC: 7.8

## 2021-09-22 LAB — BASIC METABOLIC PANEL
BUN: 12 (ref 4–21)
CO2: 24 — AB (ref 13–22)
Chloride: 106 (ref 99–108)
Creatinine: 0.7 (ref 0.5–1.1)
Glucose: 102
Potassium: 3.8 mEq/L (ref 3.5–5.1)
Sodium: 139 (ref 137–147)

## 2021-09-22 LAB — FOLATE: Folate: 33.4 ng/mL (ref >5.9)

## 2021-09-22 LAB — HEPATIC FUNCTION PANEL
ALT: 14 U/L (ref 7–35)
AST: 24 (ref 13–35)
Alkaline Phosphatase: 109 (ref 25–125)
Bilirubin, Total: 0.8

## 2021-09-22 LAB — VITAMIN B12: Vitamin B-12: 367 pg/mL (ref 180–914)

## 2021-09-22 LAB — COMPREHENSIVE METABOLIC PANEL
Albumin: 3.9 (ref 3.5–5.0)
Calcium: 8.9 (ref 8.7–10.7)

## 2021-09-22 LAB — IRON AND TIBC
Iron: 34 ug/dL (ref 28–170)
Saturation Ratios: 12 % (ref 10.4–31.8)
TIBC: 294 ug/dL (ref 250–450)
UIBC: 260 ug/dL

## 2021-09-22 LAB — CBC: RBC: 3.11 — AB (ref 3.87–5.11)

## 2021-09-22 LAB — FERRITIN: Ferritin: 129 ng/mL (ref 11–307)

## 2021-09-22 NOTE — Telephone Encounter (Signed)
Dr Bobby Rumpf reviewed labs and states that, "all labs look good. She will ned to get back on red shot therapy".

## 2021-09-23 ENCOUNTER — Encounter: Payer: Self-pay | Admitting: Oncology

## 2021-09-23 ENCOUNTER — Other Ambulatory Visit: Payer: Self-pay | Admitting: Oncology

## 2021-09-23 DIAGNOSIS — D631 Anemia in chronic kidney disease: Secondary | ICD-10-CM

## 2021-09-25 ENCOUNTER — Telehealth: Payer: Self-pay | Admitting: Oncology

## 2021-09-25 NOTE — Telephone Encounter (Signed)
Contacted pt to schedule follow up per 09/25/21 los but vm box was full so I was unable to leave a msg on [phone].

## 2021-09-29 DIAGNOSIS — Z6827 Body mass index (BMI) 27.0-27.9, adult: Secondary | ICD-10-CM | POA: Diagnosis not present

## 2021-09-29 DIAGNOSIS — Z139 Encounter for screening, unspecified: Secondary | ICD-10-CM | POA: Diagnosis not present

## 2021-09-29 DIAGNOSIS — M069 Rheumatoid arthritis, unspecified: Secondary | ICD-10-CM | POA: Diagnosis not present

## 2021-09-29 DIAGNOSIS — Z9181 History of falling: Secondary | ICD-10-CM | POA: Diagnosis not present

## 2021-10-10 DIAGNOSIS — Z6827 Body mass index (BMI) 27.0-27.9, adult: Secondary | ICD-10-CM | POA: Diagnosis not present

## 2021-10-10 DIAGNOSIS — R296 Repeated falls: Secondary | ICD-10-CM | POA: Diagnosis not present

## 2021-10-10 DIAGNOSIS — M0579 Rheumatoid arthritis with rheumatoid factor of multiple sites without organ or systems involvement: Secondary | ICD-10-CM | POA: Diagnosis not present

## 2021-10-10 DIAGNOSIS — E663 Overweight: Secondary | ICD-10-CM | POA: Diagnosis not present

## 2021-10-10 DIAGNOSIS — M1991 Primary osteoarthritis, unspecified site: Secondary | ICD-10-CM | POA: Diagnosis not present

## 2021-10-18 DIAGNOSIS — Z6828 Body mass index (BMI) 28.0-28.9, adult: Secondary | ICD-10-CM | POA: Diagnosis not present

## 2021-10-18 DIAGNOSIS — E785 Hyperlipidemia, unspecified: Secondary | ICD-10-CM | POA: Diagnosis not present

## 2021-10-18 DIAGNOSIS — M069 Rheumatoid arthritis, unspecified: Secondary | ICD-10-CM | POA: Diagnosis not present

## 2021-10-18 DIAGNOSIS — I4891 Unspecified atrial fibrillation: Secondary | ICD-10-CM | POA: Diagnosis not present

## 2021-10-18 DIAGNOSIS — I50810 Right heart failure, unspecified: Secondary | ICD-10-CM | POA: Diagnosis not present

## 2021-10-18 DIAGNOSIS — I10 Essential (primary) hypertension: Secondary | ICD-10-CM | POA: Diagnosis not present

## 2021-10-18 DIAGNOSIS — E538 Deficiency of other specified B group vitamins: Secondary | ICD-10-CM | POA: Diagnosis not present

## 2021-10-18 DIAGNOSIS — E0590 Thyrotoxicosis, unspecified without thyrotoxic crisis or storm: Secondary | ICD-10-CM | POA: Diagnosis not present

## 2021-10-18 DIAGNOSIS — E559 Vitamin D deficiency, unspecified: Secondary | ICD-10-CM | POA: Diagnosis not present

## 2021-10-18 DIAGNOSIS — J449 Chronic obstructive pulmonary disease, unspecified: Secondary | ICD-10-CM | POA: Diagnosis not present

## 2021-10-24 ENCOUNTER — Encounter: Payer: Self-pay | Admitting: Oncology

## 2021-10-25 ENCOUNTER — Inpatient Hospital Stay: Payer: Medicare HMO

## 2021-10-25 ENCOUNTER — Inpatient Hospital Stay: Payer: Medicare HMO | Attending: Oncology

## 2021-10-25 VITALS — BP 177/80 | HR 68 | Temp 98.1°F | Resp 20 | Ht 64.0 in | Wt 167.8 lb

## 2021-10-25 DIAGNOSIS — M069 Rheumatoid arthritis, unspecified: Secondary | ICD-10-CM | POA: Diagnosis not present

## 2021-10-25 DIAGNOSIS — D638 Anemia in other chronic diseases classified elsewhere: Secondary | ICD-10-CM | POA: Diagnosis not present

## 2021-10-25 DIAGNOSIS — E538 Deficiency of other specified B group vitamins: Secondary | ICD-10-CM

## 2021-10-25 DIAGNOSIS — D649 Anemia, unspecified: Secondary | ICD-10-CM | POA: Diagnosis not present

## 2021-10-25 DIAGNOSIS — D631 Anemia in chronic kidney disease: Secondary | ICD-10-CM

## 2021-10-25 DIAGNOSIS — N189 Chronic kidney disease, unspecified: Secondary | ICD-10-CM | POA: Diagnosis not present

## 2021-10-25 LAB — IRON AND TIBC
Iron: 55 ug/dL (ref 28–170)
Saturation Ratios: 16 % (ref 10.4–31.8)
TIBC: 343 ug/dL (ref 250–450)
UIBC: 288 ug/dL

## 2021-10-25 LAB — HEPATIC FUNCTION PANEL
ALT: 11 U/L (ref 7–35)
AST: 23 (ref 13–35)
Alkaline Phosphatase: 107 (ref 25–125)
Bilirubin, Total: 0.9

## 2021-10-25 LAB — COMPREHENSIVE METABOLIC PANEL
Albumin: 4.1 (ref 3.5–5.0)
Calcium: 8.8 (ref 8.7–10.7)

## 2021-10-25 LAB — CBC AND DIFFERENTIAL
HCT: 27 — AB (ref 36–46)
Hemoglobin: 8.8 — AB (ref 12.0–16.0)
Neutrophils Absolute: 6.64
Platelets: 333 10*3/uL (ref 150–400)
WBC: 9.1

## 2021-10-25 LAB — BASIC METABOLIC PANEL
BUN: 9 (ref 4–21)
CO2: 26 — AB (ref 13–22)
Chloride: 103 (ref 99–108)
Creatinine: 0.6 (ref 0.5–1.1)
Glucose: 95
Potassium: 4.2 mEq/L (ref 3.5–5.1)
Sodium: 134 — AB (ref 137–147)

## 2021-10-25 LAB — CBC: RBC: 3.06 — AB (ref 3.87–5.11)

## 2021-10-25 LAB — FERRITIN: Ferritin: 89 ng/mL (ref 11–307)

## 2021-10-25 MED ORDER — EPOETIN ALFA-EPBX 20000 UNIT/ML IJ SOLN
20000.0000 [IU] | Freq: Once | INTRAMUSCULAR | Status: AC
  Administered 2021-10-25: 20000 [IU] via SUBCUTANEOUS
  Filled 2021-10-25: qty 1

## 2021-10-25 MED ORDER — CYANOCOBALAMIN 1000 MCG/ML IJ SOLN
1000.0000 ug | Freq: Once | INTRAMUSCULAR | Status: AC
  Administered 2021-10-25: 1000 ug via INTRAMUSCULAR
  Filled 2021-10-25: qty 1

## 2021-10-25 NOTE — Patient Instructions (Signed)
Vitamin B12 InjectioEpoetin Alfa Injection What is this medication? EPOETIN ALFA (e POE e tin AL fa) treats low levels of red blood cells (anemia) caused by kidney disease, chemotherapy, or HIV medications. It can also be used in people who are at risk for blood loss during surgery. It works by Building control surveyor make more red blood cells, which reduces the need for blood transfusions. This medicine may be used for other purposes; ask your health care provider or pharmacist if you have questions. COMMON BRAND NAME(S): Epogen, Procrit, Retacrit What should I tell my care team before I take this medication? They need to know if you have any of these conditions: Blood clots Cancer Heart disease High blood pressure On dialysis Seizures Stroke An unusual or allergic reaction to epoetin alfa, albumin, benzyl alcohol, other medications, foods, dyes, or preservatives Pregnant or trying to get pregnant Breast-feeding How should I use this medication? This medication is injected into a vein or under the skin. It is usually given by your care team in a hospital or clinic setting. It may also be given at home. If you get this medication at home, you will be taught how to prepare and give it. Use exactly as directed. Take it as directed on the prescription label at the same time every day. Keep taking it unless your care team tells you to stop. It is important that you put your used needles and syringes in a special sharps container. Do not put them in a trash can. If you do not have a sharps container, call your pharmacist or care team to get one. A special MedGuide will be given to you by the pharmacist with each prescription and refill. Be sure to read this information carefully each time. Talk to your care team about the use of this medication in children. While this medication may be used in children as young as 72 month of age for selected conditions, precautions do apply. Overdosage: If you think you  have taken too much of this medicine contact a poison control center or emergency room at once. NOTE: This medicine is only for you. Do not share this medicine with others. What if I miss a dose? If you miss a dose, take it as soon as you can. If it is almost time for your next dose, take only that dose. Do not take double or extra doses. What may interact with this medication? Darbepoetin alfa Methoxy polyethylene glycol-epoetin beta This list may not describe all possible interactions. Give your health care provider a list of all the medicines, herbs, non-prescription drugs, or dietary supplements you use. Also tell them if you smoke, drink alcohol, or use illegal drugs. Some items may interact with your medicine. What should I watch for while using this medication? Visit your care team for regular checks on your progress. Check your blood pressure as directed. Know what your blood pressure should be and when to contact your care team. Your condition will be monitored carefully while you are receiving this medication. You may need blood work while taking this medication. What side effects may I notice from receiving this medication? Side effects that you should report to your care team as soon as possible: Allergic reactions--skin rash, itching, hives, swelling of the face, lips, tongue, or throat Blood clot--pain, swelling, or warmth in the leg, shortness of breath, chest pain Heart attack--pain or tightness in the chest, shoulders, arms, or jaw, nausea, shortness of breath, cold or clammy skin, feeling faint or  lightheaded Increase in blood pressure Rash, fever, and swollen lymph nodes Redness, blistering, peeling, or loosening of the skin, including inside the mouth Seizures Stroke--sudden numbness or weakness of the face, arm, or leg, trouble speaking, confusion, trouble walking, loss of balance or coordination, dizziness, severe headache, change in vision Side effects that usually do not  require medical attention (report to your care team if they continue or are bothersome): Bone, joint, or muscle pain Cough Headache Nausea Pain, redness, or irritation at injection site This list may not describe all possible side effects. Call your doctor for medical advice about side effects. You may report side effects to FDA at 1-800-FDA-1088. Where should I keep my medication? Keep out of the reach of children and pets. Store in a refrigerator. Do not freeze. Do not shake. Protect from light. Keep this medication in the original container until you are ready to take it. See product for storage information. Get rid of any unused medication after the expiration date. To get rid of medications that are no longer needed or have expired: Take the medication to a medication take-back program. Check with your pharmacy or law enforcement to find a location. If you cannot return the medication, ask your pharmacist or care team how to get rid of the medication safely. NOTE: This sheet is a summary. It may not cover all possible information. If you have questions about this medicine, talk to your doctor, pharmacist, or health care provider.  2023 Elsevier/Gold Standard (2021-03-28 00:00:00) n What is this medication? Vitamin B12 (VAHY tuh min B12) prevents and treats low vitamin B12 levels in your body. It is used in people who do not get enough vitamin B12 from their diet or when their digestive tract does not absorb enough. Vitamin B12 plays an important role in maintaining the health of your nervous system and red blood cells. This medicine may be used for other purposes; ask your health care provider or pharmacist if you have questions. COMMON BRAND NAME(S): B-12 Compliance Kit, B-12 Injection Kit, Cyomin, Dodex, LA-12, Nutri-Twelve, Physicians EZ Use B-12, Primabalt What should I tell my care team before I take this medication? They need to know if you have any of these conditions: Kidney  disease Leber's disease Megaloblastic anemia An unusual or allergic reaction to cyanocobalamin, cobalt, other medications, foods, dyes, or preservatives Pregnant or trying to get pregnant Breast-feeding How should I use this medication? This medication is injected into a muscle or deeply under the skin. It is usually given in a clinic or care team's office. However, your care team may teach you how to inject yourself. Follow all instructions. Talk to your care team about the use of this medication in children. Special care may be needed. Overdosage: If you think you have taken too much of this medicine contact a poison control center or emergency room at once. NOTE: This medicine is only for you. Do not share this medicine with others. What if I miss a dose? If you are given your dose at a clinic or care team's office, call to reschedule your appointment. If you give your own injections, and you miss a dose, take it as soon as you can. If it is almost time for your next dose, take only that dose. Do not take double or extra doses. What may interact with this medication? Alcohol Colchicine This list may not describe all possible interactions. Give your health care provider a list of all the medicines, herbs, non-prescription drugs, or dietary supplements  you use. Also tell them if you smoke, drink alcohol, or use illegal drugs. Some items may interact with your medicine. What should I watch for while using this medication? Visit your care team regularly. You may need blood work done while you are taking this medication. You may need to follow a special diet. Talk to your care team. Limit your alcohol intake and avoid smoking to get the best benefit. What side effects may I notice from receiving this medication? Side effects that you should report to your care team as soon as possible: Allergic reactions--skin rash, itching, hives, swelling of the face, lips, tongue, or throat Swelling of the  ankles, hands, or feet Trouble breathing Side effects that usually do not require medical attention (report to your care team if they continue or are bothersome): Diarrhea This list may not describe all possible side effects. Call your doctor for medical advice about side effects. You may report side effects to FDA at 1-800-FDA-1088. Where should I keep my medication? Keep out of the reach of children. Store at room temperature between 15 and 30 degrees C (59 and 85 degrees F). Protect from light. Throw away any unused medication after the expiration date. NOTE: This sheet is a summary. It may not cover all possible information. If you have questions about this medicine, talk to your doctor, pharmacist, or health care provider.  2023 Elsevier/Gold Standard (2007-02-08 00:00:00)  

## 2021-10-27 DIAGNOSIS — I10 Essential (primary) hypertension: Secondary | ICD-10-CM | POA: Diagnosis not present

## 2021-10-27 DIAGNOSIS — K922 Gastrointestinal hemorrhage, unspecified: Secondary | ICD-10-CM | POA: Diagnosis not present

## 2021-10-27 DIAGNOSIS — D649 Anemia, unspecified: Secondary | ICD-10-CM | POA: Diagnosis not present

## 2021-10-27 DIAGNOSIS — I480 Paroxysmal atrial fibrillation: Secondary | ICD-10-CM | POA: Diagnosis not present

## 2021-10-27 DIAGNOSIS — E785 Hyperlipidemia, unspecified: Secondary | ICD-10-CM | POA: Diagnosis not present

## 2021-11-21 ENCOUNTER — Encounter: Payer: Self-pay | Admitting: Oncology

## 2021-11-21 ENCOUNTER — Inpatient Hospital Stay: Payer: Medicare HMO | Attending: Oncology

## 2021-11-21 DIAGNOSIS — E538 Deficiency of other specified B group vitamins: Secondary | ICD-10-CM | POA: Diagnosis not present

## 2021-11-21 DIAGNOSIS — D509 Iron deficiency anemia, unspecified: Secondary | ICD-10-CM | POA: Insufficient documentation

## 2021-11-21 LAB — CBC WITH DIFFERENTIAL (CANCER CENTER ONLY)
Abs Immature Granulocytes: 0.08 10*3/uL — ABNORMAL HIGH (ref 0.00–0.07)
Basophils Absolute: 0.1 10*3/uL (ref 0.0–0.1)
Basophils Relative: 1 %
Eosinophils Absolute: 0.3 10*3/uL (ref 0.0–0.5)
Eosinophils Relative: 4 %
HCT: 33.6 % — ABNORMAL LOW (ref 36.0–46.0)
Hemoglobin: 10.3 g/dL — ABNORMAL LOW (ref 12.0–15.0)
Immature Granulocytes: 1 %
Lymphocytes Relative: 16 %
Lymphs Abs: 1.5 10*3/uL (ref 0.7–4.0)
MCH: 27 pg (ref 26.0–34.0)
MCHC: 30.7 g/dL (ref 30.0–36.0)
MCV: 88.2 fL (ref 80.0–100.0)
Monocytes Absolute: 1 10*3/uL (ref 0.1–1.0)
Monocytes Relative: 11 %
Neutro Abs: 6.2 10*3/uL (ref 1.7–7.7)
Neutrophils Relative %: 67 %
Platelet Count: 427 10*3/uL — ABNORMAL HIGH (ref 150–400)
RBC: 3.81 MIL/uL — ABNORMAL LOW (ref 3.87–5.11)
RDW: 15.5 % (ref 11.5–15.5)
WBC Count: 9.2 10*3/uL (ref 4.0–10.5)
nRBC: 0 % (ref 0.0–0.2)

## 2021-11-21 LAB — CMP (CANCER CENTER ONLY)
ALT: 9 U/L (ref 0–44)
AST: 14 U/L — ABNORMAL LOW (ref 15–41)
Albumin: 3.9 g/dL (ref 3.5–5.0)
Alkaline Phosphatase: 95 U/L (ref 38–126)
Anion gap: 9 (ref 5–15)
BUN: 18 mg/dL (ref 8–23)
CO2: 27 mmol/L (ref 22–32)
Calcium: 9.1 mg/dL (ref 8.9–10.3)
Chloride: 102 mmol/L (ref 98–111)
Creatinine: 0.89 mg/dL (ref 0.44–1.00)
GFR, Estimated: >60 mL/min (ref >60)
Glucose, Bld: 77 mg/dL (ref 70–99)
Potassium: 4 mmol/L (ref 3.5–5.1)
Sodium: 138 mmol/L (ref 135–145)
Total Bilirubin: 0.6 mg/dL (ref 0.3–1.2)
Total Protein: 7.8 g/dL (ref 6.5–8.1)

## 2021-11-21 LAB — IRON AND TIBC
Iron: 26 ug/dL — ABNORMAL LOW (ref 28–170)
Saturation Ratios: 7 % — ABNORMAL LOW (ref 10.4–31.8)
TIBC: 358 ug/dL (ref 250–450)
UIBC: 332 ug/dL

## 2021-11-21 LAB — FOLATE: Folate: >40 ng/mL (ref >5.9)

## 2021-11-21 LAB — VITAMIN B12: Vitamin B-12: 437 pg/mL (ref 180–914)

## 2021-11-21 LAB — FERRITIN: Ferritin: 52 ng/mL (ref 11–307)

## 2021-11-22 ENCOUNTER — Inpatient Hospital Stay: Payer: Medicare HMO

## 2021-11-22 ENCOUNTER — Other Ambulatory Visit: Payer: Medicare HMO

## 2021-11-22 ENCOUNTER — Encounter: Payer: Self-pay | Admitting: Oncology

## 2021-11-22 VITALS — BP 155/71 | HR 81 | Temp 98.2°F | Resp 18 | Ht 64.0 in | Wt 167.0 lb

## 2021-11-22 DIAGNOSIS — E538 Deficiency of other specified B group vitamins: Secondary | ICD-10-CM

## 2021-11-22 DIAGNOSIS — D509 Iron deficiency anemia, unspecified: Secondary | ICD-10-CM | POA: Diagnosis not present

## 2021-11-22 MED ORDER — CYANOCOBALAMIN 1000 MCG/ML IJ SOLN
1000.0000 ug | Freq: Once | INTRAMUSCULAR | Status: AC
  Administered 2021-11-22: 1000 ug via INTRAMUSCULAR
  Filled 2021-11-22: qty 1

## 2021-11-22 NOTE — Patient Instructions (Signed)
Vitamin B12 Deficiency Vitamin B12 deficiency occurs when the body does not have enough of this important vitamin. The body needs this vitamin: To make red blood cells. To make DNA. This is the genetic material inside cells. To help the nerves work properly so they can carry messages from the brain to the body. Vitamin B12 deficiency can cause health problems, such as not having enough red blood cells in the blood (anemia). This can lead to nerve damage if untreated. What are the causes? This condition may be caused by: Not eating enough foods that contain vitamin B12. Not having enough stomach acid and digestive fluids to properly absorb vitamin B12 from the food that you eat. Having certain diseases that make it hard to absorb vitamin B12. These diseases include Crohn's disease, chronic pancreatitis, and cystic fibrosis. An autoimmune disorder in which the body does not make enough of a protein (intrinsic factor) within the stomach, resulting in not enough absorption of vitamin B12. Having a surgery in which part of the stomach or small intestine is removed. Taking certain medicines that make it hard for the body to absorb vitamin B12. These include: Heartburn medicines, such as antacids and proton pump inhibitors. Some medicines that are used to treat diabetes. What increases the risk? The following factors may make you more likely to develop a vitamin B12 deficiency: Being an older adult. Eating a vegetarian or vegan diet that does not include any foods that come from animals. Eating a poor diet while you are pregnant. Taking certain medicines. Having alcoholism. What are the signs or symptoms? In some cases, there are no symptoms of this condition. If the condition leads to anemia or nerve damage, various symptoms may occur, such as: Weakness. Tiredness (fatigue). Loss of appetite. Numbness or tingling in your hands and feet. Redness and burning of the tongue. Depression,  confusion, or memory problems. Trouble walking. If anemia is severe, symptoms can include: Shortness of breath. Dizziness. Rapid heart rate. How is this diagnosed? This condition may be diagnosed with a blood test to measure the level of vitamin B12 in your blood. You may also have other tests, including: A group of tests that measure certain characteristics of blood cells (complete blood count, CBC). A blood test to measure intrinsic factor. A procedure where a thin tube with a camera on the end is used to look into your stomach or intestines (endoscopy). Other tests may be needed to discover the cause of the deficiency. How is this treated? Treatment for this condition depends on the cause. This condition may be treated by: Changing your eating and drinking habits, such as: Eating more foods that contain vitamin B12. Drinking less alcohol or no alcohol. Getting vitamin B12 injections. Taking vitamin B12 supplements by mouth (orally). Your health care provider will tell you which dose is best for you. Follow these instructions at home: Eating and drinking  Include foods in your diet that come from animals and contain a lot of vitamin B12. These include: Meats and poultry. This includes beef, pork, chicken, turkey, and organ meats, such as liver. Seafood. This includes clams, rainbow trout, salmon, tuna, and haddock. Eggs. Dairy foods such as milk, yogurt, and cheese. Eat foods that have vitamin B12 added to them (are fortified), such as ready-to-eat breakfast cereals. Check the label on the package to see if a food is fortified. The items listed above may not be a complete list of foods and beverages you can eat and drink. Contact a dietitian for   more information. Alcohol use Do not drink alcohol if: Your health care provider tells you not to drink. You are pregnant, may be pregnant, or are planning to become pregnant. If you drink alcohol: Limit how much you have to: 0-1 drink a  day for women. 0-2 drinks a day for men. Know how much alcohol is in your drink. In the [place] one drink equals one 12 oz bottle of beer (355 mL), one 5 oz glass of wine (148 mL), or one 1 oz glass of hard liquor (44 mL). General instructions Get vitamin B12 injections if told to by your health care provider. Take supplements only as told by your health care provider. Follow the directions carefully. Keep all follow-up visits. This is important. Contact a health care provider if: Your symptoms come back. Your symptoms get worse or do not improve with treatment. Get help right away: You develop shortness of breath. You have a rapid heart rate. You have chest pain. You become dizzy or you faint. These symptoms may be an emergency. Get help right away. Call 911. Do not wait to see if the symptoms will go away. Do not drive yourself to the hospital. Summary Vitamin B12 deficiency occurs when the body does not have enough of this important vitamin. Common causes include not eating enough foods that contain vitamin B12, not being able to absorb vitamin B12 from the food that you eat, having a surgery in which part of the stomach or small intestine is removed, or taking certain medicines. Eat foods that have vitamin B12 in them. Treatment may include making a change in the way you eat and drink, getting vitamin B12 injections, or taking vitamin B12 supplements. This information is not intended to replace advice given to you by your health care provider. Make sure you discuss any questions you have with your health care provider. Document Revised: 08/12/2020 Document Reviewed: 08/12/2020 Elsevier Patient Education  2023 Elsevier Inc.  

## 2021-11-27 ENCOUNTER — Telehealth: Payer: Self-pay | Admitting: Oncology

## 2021-11-27 NOTE — Telephone Encounter (Signed)
Patient has been scheduled. Pt's sister is aware of appt dates and times.   Scheduling Message Entered by Juanetta Beets on 11/22/2021 at 10:03 AM Priority: Routine <No visit type provided>  Department: [facility]  Provider:  Appointment Notes:  Please schedule pt for 5 doses of venofer  Scheduling Notes:

## 2021-12-04 ENCOUNTER — Telehealth: Payer: Self-pay | Admitting: Oncology

## 2021-12-04 MED FILL — Iron Sucrose Inj 20 MG/ML (Fe Equiv): INTRAVENOUS | Qty: 10 | Status: AC

## 2021-12-04 NOTE — Telephone Encounter (Signed)
Pt is currently sick and Stanton Kidney wishes all IV Iron appts be cancelled until Gabrielle Miller is feeling better. They will call the office back to R/S

## 2021-12-05 ENCOUNTER — Inpatient Hospital Stay: Payer: Medicare HMO

## 2021-12-08 ENCOUNTER — Inpatient Hospital Stay: Payer: Medicare HMO

## 2021-12-11 ENCOUNTER — Ambulatory Visit: Payer: Medicare HMO

## 2021-12-12 ENCOUNTER — Encounter: Payer: Self-pay | Admitting: Oncology

## 2021-12-12 MED FILL — Iron Sucrose Inj 20 MG/ML (Fe Equiv): INTRAVENOUS | Qty: 10 | Status: AC

## 2021-12-13 ENCOUNTER — Inpatient Hospital Stay: Payer: Medicare HMO | Attending: Oncology

## 2021-12-13 VITALS — BP 148/67 | HR 83 | Resp 20 | Ht 64.0 in | Wt 165.8 lb

## 2021-12-13 DIAGNOSIS — D509 Iron deficiency anemia, unspecified: Secondary | ICD-10-CM | POA: Diagnosis not present

## 2021-12-13 DIAGNOSIS — E538 Deficiency of other specified B group vitamins: Secondary | ICD-10-CM | POA: Diagnosis not present

## 2021-12-13 MED ORDER — SODIUM CHLORIDE 0.9 % IV SOLN
200.0000 mg | Freq: Once | INTRAVENOUS | Status: AC
  Administered 2021-12-13: 200 mg via INTRAVENOUS
  Filled 2021-12-13: qty 10

## 2021-12-13 MED ORDER — SODIUM CHLORIDE 0.9 % IV SOLN: Freq: Once | INTRAVENOUS | Status: AC

## 2021-12-13 NOTE — Patient Instructions (Signed)
Iron Deficiency Anemia, Adult  Iron deficiency anemia is when you do not have enough red blood cells or hemoglobin in your blood. This happens because you have too little iron in your body. Hemoglobin carries oxygen to parts of the body. Anemia can cause your body to not get enough oxygen. What are the causes? Not eating enough foods that have iron in them. The body not being able to take in iron well. Blood loss. What increases the risk? Having menstrual periods. Being pregnant. What are the signs or symptoms? Pale skin, lips, and nails. Weakness, dizziness, and getting tired easily. Feeling like you cannot breathe well when moving (shortness of breath). Cold hands and feet. Mild anemia may not cause any symptoms. How is this treated? This condition is treated by finding out why you do not have enough iron and then getting more iron. It may include: Adding foods to your diet that have a lot of iron. Taking iron pills (supplements). If you are pregnant or breastfeeding, you may need to take extra iron. Your diet often does not provide the amount of iron that you need. Getting more vitamin C in your diet. Vitamin C helps your body take in iron. You may need to take iron pills with a glass of orange juice or vitamin C pills. Medicines to make heavy menstrual periods lighter. Surgery or testing procedures to find what is causing the condition. You may need blood tests to see if treatment is working. If the treatment does not seem to be working, you may need more tests. Follow these instructions at home: Medicines Take over-the-counter and prescription medicines only as told by your doctor. This includes iron pills and vitamins. Taking them as told is important because too much iron can be harmful. Take iron pills when your stomach is empty. If you cannot handle this, take them with food. Do not drink milk or take antacids at the same time as your iron pills. Iron pills may turn your poop  (stool)black. If you cannot handle taking iron pills by mouth, ask your doctor about getting iron through: An IV tube. A shot (injection) into a muscle. Eating and drinking Talk with your doctor before changing the foods you eat. Your doctor may tell you to eat foods that have a lot of iron, such as: Liver. Low-fat (lean) beef. Breads and cereals that have iron added to them. Eggs. Dried fruit. Dark green, leafy vegetables. Eat fresh fruits and vegetables that are high in vitamin C. They help your body use iron. Foods with a lot of vitamin C include: Oranges. Peppers. Tomatoes. Mangoes. Managing constipation If you are taking iron pills, they may cause trouble pooping (constipation). To prevent or treat this, you may need to: Drink enough fluid to keep your pee (urine) pale yellow. Take over-the-counter or prescription medicines. Eat foods that are high in fiber. These include beans, whole grains, and fresh fruits and vegetables. Limit foods that are high in fat and sugar. These include fried or sweet foods. General instructions Return to your normal activities when your doctor says that it is safe. Keep all follow-up visits. Contact a doctor if: You feel like you may vomit (nauseous), or you vomit. You feel weak. You get light-headed when getting up from sitting or lying down. You are sweating for no reason. You have trouble pooping. You have worse breathing with physical activity. You have heaviness in your chest. Get help right away if: You faint. If this happens, do not drive yourself   to the hospital. You have a fast heartbeat, or a heartbeat that does not feel regular. Summary Iron deficiency anemia happens when you have too little iron in your body. This condition is treated by finding out why you do not have enough iron in your body and then getting more iron. Take over-the-counter and prescription medicines only as told by your doctor. Eat fresh fruits and vegetables  that are high in vitamin C. Contact a doctor if you have trouble pooping or feel weak. This information is not intended to replace advice given to you by your health care provider. Make sure you discuss any questions you have with your health care provider. Document Revised: 01/26/2021 Document Reviewed: 01/26/2021 Elsevier Patient Education  [place]. Iron-Rich Diet  Iron is a mineral that helps your body produce hemoglobin. Hemoglobin is a protein in red blood cells that carries oxygen to your body's tissues. Eating too little iron may cause you to feel weak and tired, and it can increase your risk of infection. Iron is naturally found in many foods, and many foods have iron added to them (are iron-fortified). You may need to follow an iron-rich diet if you do not have enough iron in your body due to certain medical conditions. The amount of iron that you need each day depends on your age, your sex, and any medical conditions you have. Follow instructions from your health care provider or a dietitian about how much iron you should eat each day. What are tips for following this plan? Reading food labels Check food labels to see how many milligrams (mg) of iron are in each serving. Cooking Cook foods in pots and pans that are made from iron. Take these steps to make it easier for your body to absorb iron from certain foods: Soak beans overnight before cooking. Soak whole grains overnight and drain them before using. Ferment flours before baking, such as by using yeast in bread dough. Meal planning When you eat foods that contain iron, you should eat them with foods that are high in vitamin C. These include oranges, peppers, tomatoes, potatoes, and mangoes. Vitamin C helps your body absorb iron. Certain foods and drinks prevent your body from absorbing iron properly. Avoid eating these foods in the same meal as iron-rich foods or with iron supplements. These foods include: Coffee, black  tea, and red wine. Milk, dairy products, and foods that are high in calcium. Beans and soybeans. Whole grains. General information Take iron supplements only as told by your health care provider. An overdose of iron can be life-threatening. If you were prescribed iron supplements, take them with orange juice or a vitamin C supplement. When you eat iron-fortified foods or take an iron supplement, you should also eat foods that naturally contain iron, such as meat, poultry, and fish. Eating naturally iron-rich foods helps your body absorb the iron that is added to other foods or contained in a supplement. Iron from animal sources is better absorbed than iron from plant sources. What foods should I eat? Fruits Prunes. Raisins. Eat fruits high in vitamin C, such as oranges, grapefruits, and strawberries, with iron-rich foods. Vegetables Spinach (cooked). Green peas. Broccoli. Fermented vegetables. Eat vegetables high in vitamin C, such as leafy greens, potatoes, bell peppers, and tomatoes, with iron-rich foods. Grains Iron-fortified breakfast cereal. Iron-fortified whole-wheat bread. Enriched rice. Sprouted grains. Meats and other proteins Beef liver. Beef. [place]. Chicken. Oysters. Shrimp. [place]. Sardines. Chickpeas. Nuts. Tofu. Pumpkin seeds. Beverages Tomato juice. Fresh orange juice.  Prune juice. Hibiscus tea. Iron-fortified instant breakfast shakes. Sweets and desserts Blackstrap molasses. Seasonings and condiments Tahini. Fermented soy sauce. Other foods Wheat germ. The items listed above may not be a complete list of recommended foods and beverages. Contact a dietitian for more information. What foods should I limit? These are foods that should be limited while eating iron-rich foods as they can reduce the absorption of iron in your body. Grains Whole grains. Bran cereal. Bran flour. Meats and other proteins Soybeans. Products made from soy protein. Black beans. Lentils. Mung  beans. Split peas. Dairy Milk. Cream. Cheese. Yogurt. Cottage cheese. Beverages Coffee. Black tea. Red wine. Sweets and desserts Cocoa. Chocolate. Ice cream. Seasonings and condiments Basil. Oregano. Large amounts of parsley. The items listed above may not be a complete list of foods and beverages you should limit. Contact a dietitian for more information. Summary Iron is a mineral that helps your body produce hemoglobin. Hemoglobin is a protein in red blood cells that carries oxygen to your body's tissues. Iron is naturally found in many foods, and many foods have iron added to them (are iron-fortified). When you eat foods that contain iron, you should eat them with foods that are high in vitamin C. Vitamin C helps your body absorb iron. Certain foods and drinks prevent your body from absorbing iron properly, such as whole grains and dairy products. You should avoid eating these foods in the same meal as iron-rich foods or with iron supplements. This information is not intended to replace advice given to you by your health care provider. Make sure you discuss any questions you have with your health care provider. Document Revised: 11/30/2019 Document Reviewed: 11/30/2019 Elsevier Patient Education  [place].

## 2021-12-14 ENCOUNTER — Ambulatory Visit: Payer: Medicare HMO

## 2021-12-15 ENCOUNTER — Inpatient Hospital Stay: Payer: Medicare HMO

## 2021-12-15 MED FILL — Iron Sucrose Inj 20 MG/ML (Fe Equiv): INTRAVENOUS | Qty: 10 | Status: AC

## 2021-12-18 ENCOUNTER — Inpatient Hospital Stay: Payer: Medicare HMO

## 2021-12-18 VITALS — BP 149/72 | HR 67 | Temp 98.7°F | Resp 20 | Ht 64.0 in | Wt 167.1 lb

## 2021-12-18 DIAGNOSIS — E538 Deficiency of other specified B group vitamins: Secondary | ICD-10-CM | POA: Diagnosis not present

## 2021-12-18 DIAGNOSIS — D509 Iron deficiency anemia, unspecified: Secondary | ICD-10-CM | POA: Diagnosis not present

## 2021-12-18 MED ORDER — SODIUM CHLORIDE 0.9 % IV SOLN
200.0000 mg | Freq: Once | INTRAVENOUS | Status: AC
  Administered 2021-12-18: 200 mg via INTRAVENOUS
  Filled 2021-12-18: qty 200

## 2021-12-18 MED ORDER — SODIUM CHLORIDE 0.9 % IV SOLN: Freq: Once | INTRAVENOUS | Status: AC

## 2021-12-18 NOTE — Patient Instructions (Signed)

## 2021-12-19 ENCOUNTER — Ambulatory Visit: Payer: Medicare HMO

## 2021-12-19 MED FILL — Iron Sucrose Inj 20 MG/ML (Fe Equiv): INTRAVENOUS | Qty: 10 | Status: AC

## 2021-12-20 ENCOUNTER — Inpatient Hospital Stay: Payer: Medicare HMO

## 2021-12-20 VITALS — BP 144/71 | HR 72 | Temp 98.0°F | Resp 18 | Ht 64.0 in | Wt 167.0 lb

## 2021-12-20 DIAGNOSIS — E538 Deficiency of other specified B group vitamins: Secondary | ICD-10-CM | POA: Diagnosis not present

## 2021-12-20 DIAGNOSIS — D509 Iron deficiency anemia, unspecified: Secondary | ICD-10-CM | POA: Diagnosis not present

## 2021-12-20 MED ORDER — SODIUM CHLORIDE 0.9 % IV SOLN
200.0000 mg | Freq: Once | INTRAVENOUS | Status: AC
  Administered 2021-12-20: 200 mg via INTRAVENOUS
  Filled 2021-12-20: qty 200

## 2021-12-20 MED ORDER — SODIUM CHLORIDE 0.9 % IV SOLN: Freq: Once | INTRAVENOUS | Status: AC

## 2021-12-20 NOTE — Patient Instructions (Signed)

## 2021-12-20 NOTE — Progress Notes (Signed)
Gabrielle Miller  [address] [place],  [place]  [address] [phone]  Clinic Day:  12/21/2021  Referring physician: Earlyne Iba, NP  HISTORY OF PRESENT ILLNESS:  The patient is an 81 y.o. female with a history of anemia secondary to chronic disease.   However, recent labs also showed her to be iron deficient for which she is in the process of receiving a course of IV iron.  She also has a history of vitamin B12 deficiency, for which she receives monthly B12 injections to maintain her cobalamin stores.  She comes in today to reassess her hemoglobin.  Since her last visit, the patient claims to be doing fairly well.  She denies having increased fatigue or any overt forms of blood loss which concern her for progressive anemia.  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  PHYSICAL EXAM:  Blood pressure (!) 169/81, pulse 81, temperature 97.8 F (36.6 C), resp. rate 16, height '5\' 4"'$  (1.626 m), weight 167 lb 1.6 oz (75.8 kg), SpO2 90 %. Wt Readings from Last 3 Encounters:  12/29/21 165 lb (74.8 kg)  12/27/21 170 lb 1.3 oz (77.1 kg)  12/21/21 167 lb 1.6 oz (75.8 kg)   Body mass index is 28.68 kg/m. Performance status (ECOG): 1 Physical Exam Constitutional:      Appearance: Normal appearance. She is not ill-appearing.  HENT:     Mouth/Throat:     Mouth: Mucous membranes are moist.     Pharynx: Oropharynx is clear. No oropharyngeal exudate or posterior oropharyngeal erythema.  Cardiovascular:     Rate and Rhythm: Normal rate and regular rhythm.     Heart sounds: No murmur heard.    No friction rub. No gallop.  Pulmonary:     Effort: Pulmonary effort is normal. No respiratory distress.     Breath sounds: Normal breath sounds. No wheezing, rhonchi or rales.  Abdominal:     General: Bowel sounds are normal. There is no distension.     Palpations: Abdomen is soft. There is no mass.     Tenderness: There is no  abdominal tenderness.  Musculoskeletal:        General: No swelling.     Right lower leg: No edema.     Left lower leg: No edema.  Lymphadenopathy:     Cervical: No cervical adenopathy.     Upper Body:     Right upper body: No supraclavicular or axillary adenopathy.     Left upper body: No supraclavicular or axillary adenopathy.     Lower Body: No right inguinal adenopathy. No left inguinal adenopathy.  Skin:    General: Skin is warm.     Coloration: Skin is not jaundiced.     Findings: No lesion or rash.  Neurological:     General: No focal deficit present.     Mental Status: She is alert and oriented to person, place, and time. Mental status is at baseline.  Psychiatric:        Mood and Affect: Mood normal.        Behavior: Behavior normal.        Thought Content: Thought content normal.    LABS:      Latest Ref Rng & Units 12/21/2021   12:00 AM 11/21/2021   11:26 AM 10/25/2021   12:00 AM  CBC  WBC  7.6     9.2  9.1      Hemoglobin 12.0 - 16.0 9.4  10.3  8.8      Hematocrit 36 - 46 29     33.6  27      Platelets 150 - 400 K/uL 398     427  333         This result is from an external source.      Latest Ref Rng & Units 11/21/2021   11:26 AM 10/25/2021   12:00 AM 09/22/2021   12:00 AM  CMP  Glucose 70 - 99 mg/dL 77     BUN 8 - 23 mg/dL '18  9     12      '$ Creatinine 0.44 - 1.00 mg/dL 0.89  0.6     0.7      Sodium 135 - 145 mmol/L 138  134     139      Potassium 3.5 - 5.1 mmol/L 4.0  4.2     3.8      Chloride 98 - 111 mmol/L 102  103     106      CO2 22 - 32 mmol/L '27  26     24      '$ Calcium 8.9 - 10.3 mg/dL 9.1  8.8     8.9      Total Protein 6.5 - 8.1 g/dL 7.8     Total Bilirubin 0.3 - 1.2 mg/dL 0.6     Alkaline Phos 38 - 126 U/L 95  107     109      AST 15 - 41 U/L '14  23     24      '$ ALT 0 - 44 U/L '9  11     14         '$ This result is from an external source.    Latest Reference Range & Units 11/21/21 11:26 12/21/21 15:08  Iron 28 - 170 ug/dL 26 (L) 118   UIBC ug/dL 332 168  TIBC 250 - 450 ug/dL 358 286  Saturation Ratios 10.4 - 31.8 % 7 (L) 41 (H)  Ferritin 11 - 307 ng/mL 52 349 (H)  (L): Data is abnormally low (H): Data is abnormally high  ASSESSMENT & PLAN:  Assessment/Plan:  A 81 y.o. female with anemia of chronic disease, as well as iron and B12 deficiency.  Since the patient has received IV iron, there has been improvement in both her iron and hemoglobin levels.  She will continue receiving her current course of IV iron.  She will also continue receiving her monthly B12 injections for her history of vitamin B12 deficiency.  I will see her back in 3 months to reassess her anemia.  The patient understands all the plans discussed today and is in agreement with them.   Sabrine Patchen Macarthur Critchley, MD

## 2021-12-21 ENCOUNTER — Telehealth: Payer: Self-pay

## 2021-12-21 ENCOUNTER — Inpatient Hospital Stay: Payer: Medicare HMO | Admitting: Oncology

## 2021-12-21 ENCOUNTER — Inpatient Hospital Stay: Payer: Medicare HMO

## 2021-12-21 ENCOUNTER — Ambulatory Visit: Payer: Medicare HMO

## 2021-12-21 ENCOUNTER — Other Ambulatory Visit: Payer: Self-pay | Admitting: Oncology

## 2021-12-21 VITALS — BP 169/81 | HR 81 | Temp 97.8°F | Resp 16 | Ht 64.0 in | Wt 167.1 lb

## 2021-12-21 DIAGNOSIS — E538 Deficiency of other specified B group vitamins: Secondary | ICD-10-CM | POA: Diagnosis not present

## 2021-12-21 DIAGNOSIS — D631 Anemia in chronic kidney disease: Secondary | ICD-10-CM

## 2021-12-21 DIAGNOSIS — D509 Iron deficiency anemia, unspecified: Secondary | ICD-10-CM | POA: Diagnosis not present

## 2021-12-21 DIAGNOSIS — D519 Vitamin B12 deficiency anemia, unspecified: Secondary | ICD-10-CM | POA: Diagnosis not present

## 2021-12-21 DIAGNOSIS — D638 Anemia in other chronic diseases classified elsewhere: Secondary | ICD-10-CM

## 2021-12-21 DIAGNOSIS — D649 Anemia, unspecified: Secondary | ICD-10-CM | POA: Diagnosis not present

## 2021-12-21 LAB — IRON AND TIBC
Iron: 118 ug/dL (ref 28–170)
Saturation Ratios: 41 % — ABNORMAL HIGH (ref 10.4–31.8)
TIBC: 286 ug/dL (ref 250–450)
UIBC: 168 ug/dL

## 2021-12-21 LAB — CBC AND DIFFERENTIAL
HCT: 29 — AB (ref 36–46)
Hemoglobin: 9.4 — AB (ref 12.0–16.0)
Neutrophils Absolute: 5.17
Platelets: 398 10*3/uL (ref 150–400)
WBC: 7.6

## 2021-12-21 LAB — CBC: RBC: 3.44 — AB (ref 3.87–5.11)

## 2021-12-21 LAB — FERRITIN: Ferritin: 349 ng/mL — ABNORMAL HIGH (ref 11–307)

## 2021-12-21 NOTE — Telephone Encounter (Addendum)
12/26/21  Mrs. Rodena Piety notified of Dr Bobby Rumpf assessment and plan. I confirmed next appt, which is 315p tomorrow. They request an earlier appt if at all possible. I will send message to infusion nurse.   Latest Reference Range & Units 09/22/21 10:12 09/22/21 11:05  Iron 28 - 170 ug/dL 34    UIBC ug/dL 260    TIBC 250 - 450 ug/dL 294    Saturation Ratios 10.4 - 31.8 % 12    Ferritin 11 - 307 ng/mL 129    Folate >5.9 ng/mL   33.4  Vitamin B12 180 - 914 pg/mL   367    ASSESSMENT & PLAN:  Assessment/Plan:  A 81 y.o. female with anemia of chronic disease.  Her hemoglobin of 8.9 is lower than what it has been previously.  Furthermore, none of her nutritional labs is particularly low.  She has had vitamin B12 deficiency in the past, but it has normalized since receiving her B12 injections.  As her hemoglobin is significantly below 10, I will arrange for her to receive Retacrit 20,000 units on a monthly basis.  I will see her back in 3 months to see how well she responded to her upcoming monthly Retacrit injections.  The patient understands all the plans discussed today and is in agreement with them.     Marice Potter, MD     12/21/2021 - Lab results are not back as of yet (1508). I notified Stanton Kidney that I would call her back in the morning. She verbalized understanding.

## 2021-12-22 ENCOUNTER — Inpatient Hospital Stay: Payer: Medicare HMO

## 2021-12-22 ENCOUNTER — Ambulatory Visit: Payer: Medicare HMO

## 2021-12-26 ENCOUNTER — Encounter: Payer: Self-pay | Admitting: Oncology

## 2021-12-26 MED FILL — Iron Sucrose Inj 20 MG/ML (Fe Equiv): INTRAVENOUS | Qty: 10 | Status: AC

## 2021-12-27 ENCOUNTER — Ambulatory Visit: Payer: Medicare HMO

## 2021-12-27 ENCOUNTER — Inpatient Hospital Stay: Payer: Medicare HMO

## 2021-12-27 VITALS — BP 158/64 | HR 84 | Temp 98.4°F | Resp 20 | Ht 64.0 in | Wt 170.1 lb

## 2021-12-27 DIAGNOSIS — E538 Deficiency of other specified B group vitamins: Secondary | ICD-10-CM | POA: Diagnosis not present

## 2021-12-27 DIAGNOSIS — D509 Iron deficiency anemia, unspecified: Secondary | ICD-10-CM | POA: Diagnosis not present

## 2021-12-27 MED ORDER — SODIUM CHLORIDE 0.9 % IV SOLN
200.0000 mg | Freq: Once | INTRAVENOUS | Status: AC
  Administered 2021-12-27: 200 mg via INTRAVENOUS
  Filled 2021-12-27: qty 200

## 2021-12-27 MED ORDER — CYANOCOBALAMIN 1000 MCG/ML IJ SOLN
1000.0000 ug | Freq: Once | INTRAMUSCULAR | Status: AC
  Administered 2021-12-27: 1000 ug via INTRAMUSCULAR
  Filled 2021-12-27: qty 1

## 2021-12-27 MED ORDER — SODIUM CHLORIDE 0.9 % IV SOLN: Freq: Once | INTRAVENOUS | Status: AC

## 2021-12-27 NOTE — Patient Instructions (Signed)
Iron Sucrose Injection What is this medication? IRON SUCROSE (EYE ern SOO krose) treats low levels of iron (iron deficiency anemia) in people with kidney disease. Iron is a mineral that plays an important role in making red blood cells, which carry oxygen from your lungs to the rest of your body. This medicine may be used for other purposes; ask your health care provider or pharmacist if you have questions. COMMON BRAND NAME(S): Venofer What should I tell my care team before I take this medication? They need to know if you have any of these conditions: Anemia not caused by low iron levels Heart disease High levels of iron in the blood Kidney disease Liver disease An unusual or allergic reaction to iron, other medications, foods, dyes, or preservatives Pregnant or trying to get pregnant Breastfeeding How should I use this medication? This medication is for infusion into a vein. It is given in a hospital or clinic setting. Talk to your care team about the use of this medication in children. While this medication may be prescribed for children as young as 2 years for selected conditions, precautions do apply. Overdosage: If you think you have taken too much of this medicine contact a poison control center or emergency room at once. NOTE: This medicine is only for you. Do not share this medicine with others. What if I miss a dose? Keep appointments for follow-up doses. It is important not to miss your dose. Call your care team if you are unable to keep an appointment. What may interact with this medication? Do not take this medication with any of the following: Deferoxamine Dimercaprol Other iron products This medication may also interact with the following: Chloramphenicol Deferasirox This list may not describe all possible interactions. Give your health care provider a list of all the medicines, herbs, non-prescription drugs, or dietary supplements you use. Also tell them if you smoke,  drink alcohol, or use illegal drugs. Some items may interact with your medicine. What should I watch for while using this medication? Visit your care team regularly. Tell your care team if your symptoms do not start to get better or if they get worse. You may need blood work done while you are taking this medication. You may need to follow a special diet. Talk to your care team. Foods that contain iron include: whole grains/cereals, dried fruits, beans, or peas, leafy green vegetables, and organ meats (liver, kidney). What side effects may I notice from receiving this medication? Side effects that you should report to your care team as soon as possible: Allergic reactions--skin rash, itching, hives, swelling of the face, lips, tongue, or throat Low blood pressure--dizziness, feeling faint or lightheaded, blurry vision Shortness of breath Side effects that usually do not require medical attention (report to your care team if they continue or are bothersome): Flushing Headache Joint pain Muscle pain Nausea Pain, redness, or irritation at injection site This list may not describe all possible side effects. Call your doctor for medical advice about side effects. You may report side effects to FDA at 1-800-FDA-1088. Where should I keep my medication? This medication is given in a hospital or clinic and will not be stored at home. NOTE: This sheet is a summary. It may not cover all possible information. If you have questions about this medicine, talk to your doctor, pharmacist, or health care provider.  2023 Elsevier/Gold Standard (2020-03-31 00:00:00) Vitamin B12 Injection What is this medication? Vitamin B12 (VAHY tuh min B12) prevents and treats low vitamin  B12 levels in your body. It is used in people who do not get enough vitamin B12 from their diet or when their digestive tract does not absorb enough. Vitamin B12 plays an important role in maintaining the health of your nervous system and red  blood cells. This medicine may be used for other purposes; ask your health care provider or pharmacist if you have questions. COMMON BRAND NAME(S): B-12 Compliance Kit, B-12 Injection Kit, Cyomin, Dodex, LA-12, Nutri-Twelve, Physicians EZ Use B-12, Primabalt What should I tell my care team before I take this medication? They need to know if you have any of these conditions: Kidney disease Leber's disease Megaloblastic anemia An unusual or allergic reaction to cyanocobalamin, cobalt, other medications, foods, dyes, or preservatives Pregnant or trying to get pregnant Breast-feeding How should I use this medication? This medication is injected into a muscle or deeply under the skin. It is usually given in a clinic or care team's office. However, your care team may teach you how to inject yourself. Follow all instructions. Talk to your care team about the use of this medication in children. Special care may be needed. Overdosage: If you think you have taken too much of this medicine contact a poison control center or emergency room at once. NOTE: This medicine is only for you. Do not share this medicine with others. What if I miss a dose? If you are given your dose at a clinic or care team's office, call to reschedule your appointment. If you give your own injections, and you miss a dose, take it as soon as you can. If it is almost time for your next dose, take only that dose. Do not take double or extra doses. What may interact with this medication? Alcohol Colchicine This list may not describe all possible interactions. Give your health care provider a list of all the medicines, herbs, non-prescription drugs, or dietary supplements you use. Also tell them if you smoke, drink alcohol, or use illegal drugs. Some items may interact with your medicine. What should I watch for while using this medication? Visit your care team regularly. You may need blood work done while you are taking this  medication. You may need to follow a special diet. Talk to your care team. Limit your alcohol intake and avoid smoking to get the best benefit. What side effects may I notice from receiving this medication? Side effects that you should report to your care team as soon as possible: Allergic reactions--skin rash, itching, hives, swelling of the face, lips, tongue, or throat Swelling of the ankles, hands, or feet Trouble breathing Side effects that usually do not require medical attention (report to your care team if they continue or are bothersome): Diarrhea This list may not describe all possible side effects. Call your doctor for medical advice about side effects. You may report side effects to FDA at 1-800-FDA-1088. Where should I keep my medication? Keep out of the reach of children. Store at room temperature between 15 and 30 degrees C (59 and 85 degrees F). Protect from light. Throw away any unused medication after the expiration date. NOTE: This sheet is a summary. It may not cover all possible information. If you have questions about this medicine, talk to your doctor, pharmacist, or health care provider.  2023 Elsevier/Gold Standard (2007-02-08 00:00:00)

## 2021-12-28 ENCOUNTER — Encounter: Payer: Self-pay | Admitting: Oncology

## 2021-12-28 MED FILL — Iron Sucrose Inj 20 MG/ML (Fe Equiv): INTRAVENOUS | Qty: 10 | Status: AC

## 2021-12-29 ENCOUNTER — Inpatient Hospital Stay: Payer: Medicare HMO

## 2021-12-29 VITALS — BP 156/69 | HR 74 | Temp 98.1°F | Resp 18 | Ht 64.0 in | Wt 165.0 lb

## 2021-12-29 DIAGNOSIS — D509 Iron deficiency anemia, unspecified: Secondary | ICD-10-CM | POA: Diagnosis not present

## 2021-12-29 DIAGNOSIS — E538 Deficiency of other specified B group vitamins: Secondary | ICD-10-CM | POA: Diagnosis not present

## 2021-12-29 MED ORDER — SODIUM CHLORIDE 0.9 % IV SOLN: Freq: Once | INTRAVENOUS | Status: AC

## 2021-12-29 MED ORDER — SODIUM CHLORIDE 0.9 % IV SOLN
200.0000 mg | Freq: Once | INTRAVENOUS | Status: AC
  Administered 2021-12-29: 200 mg via INTRAVENOUS
  Filled 2021-12-29: qty 200

## 2021-12-29 NOTE — Patient Instructions (Signed)

## 2022-01-01 ENCOUNTER — Encounter: Payer: Self-pay | Admitting: Oncology

## 2022-01-08 DIAGNOSIS — J45909 Unspecified asthma, uncomplicated: Secondary | ICD-10-CM | POA: Diagnosis not present

## 2022-01-08 DIAGNOSIS — K219 Gastro-esophageal reflux disease without esophagitis: Secondary | ICD-10-CM | POA: Diagnosis not present

## 2022-01-08 DIAGNOSIS — I509 Heart failure, unspecified: Secondary | ICD-10-CM | POA: Diagnosis not present

## 2022-01-08 DIAGNOSIS — I130 Hypertensive heart and chronic kidney disease with heart failure and stage 1 through stage 4 chronic kidney disease, or unspecified chronic kidney disease: Secondary | ICD-10-CM | POA: Diagnosis not present

## 2022-01-08 DIAGNOSIS — E785 Hyperlipidemia, unspecified: Secondary | ICD-10-CM | POA: Diagnosis not present

## 2022-01-08 DIAGNOSIS — M1990 Unspecified osteoarthritis, unspecified site: Secondary | ICD-10-CM | POA: Diagnosis not present

## 2022-01-08 DIAGNOSIS — R32 Unspecified urinary incontinence: Secondary | ICD-10-CM | POA: Diagnosis not present

## 2022-01-08 DIAGNOSIS — Z8249 Family history of ischemic heart disease and other diseases of the circulatory system: Secondary | ICD-10-CM | POA: Diagnosis not present

## 2022-01-08 DIAGNOSIS — M069 Rheumatoid arthritis, unspecified: Secondary | ICD-10-CM | POA: Diagnosis not present

## 2022-01-08 DIAGNOSIS — I4891 Unspecified atrial fibrillation: Secondary | ICD-10-CM | POA: Diagnosis not present

## 2022-01-08 DIAGNOSIS — N182 Chronic kidney disease, stage 2 (mild): Secondary | ICD-10-CM | POA: Diagnosis not present

## 2022-01-08 DIAGNOSIS — D84821 Immunodeficiency due to drugs: Secondary | ICD-10-CM | POA: Diagnosis not present

## 2022-01-19 ENCOUNTER — Encounter: Payer: Self-pay | Admitting: Oncology

## 2022-01-24 ENCOUNTER — Inpatient Hospital Stay: Payer: Medicare HMO | Attending: Oncology

## 2022-01-24 DIAGNOSIS — D638 Anemia in other chronic diseases classified elsewhere: Secondary | ICD-10-CM | POA: Diagnosis not present

## 2022-01-24 DIAGNOSIS — E538 Deficiency of other specified B group vitamins: Secondary | ICD-10-CM | POA: Diagnosis not present

## 2022-01-24 DIAGNOSIS — D509 Iron deficiency anemia, unspecified: Secondary | ICD-10-CM | POA: Diagnosis not present

## 2022-01-24 LAB — CBC WITH DIFFERENTIAL (CANCER CENTER ONLY)
Abs Immature Granulocytes: 0.03 10*3/uL (ref 0.00–0.07)
Basophils Absolute: 0.1 10*3/uL (ref 0.0–0.1)
Basophils Relative: 1 %
Eosinophils Absolute: 0.2 10*3/uL (ref 0.0–0.5)
Eosinophils Relative: 2 %
HCT: 32.9 % — ABNORMAL LOW (ref 36.0–46.0)
Hemoglobin: 10.3 g/dL — ABNORMAL LOW (ref 12.0–15.0)
Immature Granulocytes: 0 %
Lymphocytes Relative: 16 %
Lymphs Abs: 1.2 10*3/uL (ref 0.7–4.0)
MCH: 28 pg (ref 26.0–34.0)
MCHC: 31.3 g/dL (ref 30.0–36.0)
MCV: 89.4 fL (ref 80.0–100.0)
Monocytes Absolute: 0.8 10*3/uL (ref 0.1–1.0)
Monocytes Relative: 11 %
Neutro Abs: 5.1 10*3/uL (ref 1.7–7.7)
Neutrophils Relative %: 70 %
Platelet Count: 322 10*3/uL (ref 150–400)
RBC: 3.68 MIL/uL — ABNORMAL LOW (ref 3.87–5.11)
RDW: 18.6 % — ABNORMAL HIGH (ref 11.5–15.5)
WBC Count: 7.4 10*3/uL (ref 4.0–10.5)
nRBC: 0 % (ref 0.0–0.2)

## 2022-01-26 ENCOUNTER — Other Ambulatory Visit: Payer: Self-pay | Admitting: Oncology

## 2022-01-26 ENCOUNTER — Inpatient Hospital Stay: Payer: Medicare HMO

## 2022-01-26 VITALS — BP 161/88 | HR 76 | Temp 98.2°F | Resp 12 | Ht 64.0 in | Wt 159.1 lb

## 2022-01-26 DIAGNOSIS — D509 Iron deficiency anemia, unspecified: Secondary | ICD-10-CM | POA: Diagnosis not present

## 2022-01-26 DIAGNOSIS — D638 Anemia in other chronic diseases classified elsewhere: Secondary | ICD-10-CM | POA: Diagnosis not present

## 2022-01-26 DIAGNOSIS — E538 Deficiency of other specified B group vitamins: Secondary | ICD-10-CM

## 2022-01-26 MED ORDER — EPOETIN ALFA-EPBX 20000 UNIT/ML IJ SOLN
20000.0000 [IU] | Freq: Once | INTRAMUSCULAR | Status: AC
  Administered 2022-01-26: 20000 [IU] via SUBCUTANEOUS
  Filled 2022-01-26: qty 1

## 2022-01-26 MED ORDER — CYANOCOBALAMIN 1000 MCG/ML IJ SOLN
1000.0000 ug | Freq: Once | INTRAMUSCULAR | Status: AC
  Administered 2022-01-26: 1000 ug via INTRAMUSCULAR
  Filled 2022-01-26: qty 1

## 2022-01-29 ENCOUNTER — Ambulatory Visit: Payer: Medicare HMO

## 2022-02-27 ENCOUNTER — Other Ambulatory Visit: Payer: Self-pay | Admitting: Oncology

## 2022-02-27 ENCOUNTER — Inpatient Hospital Stay: Payer: Medicare HMO | Attending: Oncology

## 2022-02-27 DIAGNOSIS — E538 Deficiency of other specified B group vitamins: Secondary | ICD-10-CM | POA: Diagnosis not present

## 2022-02-27 DIAGNOSIS — D649 Anemia, unspecified: Secondary | ICD-10-CM | POA: Insufficient documentation

## 2022-02-27 DIAGNOSIS — D638 Anemia in other chronic diseases classified elsewhere: Secondary | ICD-10-CM

## 2022-02-27 DIAGNOSIS — D509 Iron deficiency anemia, unspecified: Secondary | ICD-10-CM | POA: Diagnosis not present

## 2022-02-27 LAB — CBC WITH DIFFERENTIAL (CANCER CENTER ONLY)
Abs Immature Granulocytes: 0.13 10*3/uL — ABNORMAL HIGH (ref 0.00–0.07)
Basophils Absolute: 0.1 10*3/uL (ref 0.0–0.1)
Basophils Relative: 1 %
Eosinophils Absolute: 0.2 10*3/uL (ref 0.0–0.5)
Eosinophils Relative: 2 %
HCT: 32.5 % — ABNORMAL LOW (ref 36.0–46.0)
Hemoglobin: 10.2 g/dL — ABNORMAL LOW (ref 12.0–15.0)
Immature Granulocytes: 1 %
Lymphocytes Relative: 18 %
Lymphs Abs: 1.6 10*3/uL (ref 0.7–4.0)
MCH: 28.4 pg (ref 26.0–34.0)
MCHC: 31.4 g/dL (ref 30.0–36.0)
MCV: 90.5 fL (ref 80.0–100.0)
Monocytes Absolute: 1 10*3/uL (ref 0.1–1.0)
Monocytes Relative: 12 %
Neutro Abs: 5.9 10*3/uL (ref 1.7–7.7)
Neutrophils Relative %: 66 %
Platelet Count: 346 10*3/uL (ref 150–400)
RBC: 3.59 MIL/uL — ABNORMAL LOW (ref 3.87–5.11)
RDW: 17.2 % — ABNORMAL HIGH (ref 11.5–15.5)
WBC Count: 9 10*3/uL (ref 4.0–10.5)
nRBC: 0 % (ref 0.0–0.2)

## 2022-03-01 ENCOUNTER — Inpatient Hospital Stay: Payer: Medicare HMO

## 2022-03-01 VITALS — BP 155/72 | HR 78 | Temp 97.6°F | Resp 20 | Wt 172.1 lb

## 2022-03-01 DIAGNOSIS — D649 Anemia, unspecified: Secondary | ICD-10-CM | POA: Diagnosis not present

## 2022-03-01 DIAGNOSIS — E538 Deficiency of other specified B group vitamins: Secondary | ICD-10-CM

## 2022-03-01 DIAGNOSIS — D509 Iron deficiency anemia, unspecified: Secondary | ICD-10-CM | POA: Diagnosis not present

## 2022-03-01 DIAGNOSIS — D638 Anemia in other chronic diseases classified elsewhere: Secondary | ICD-10-CM | POA: Diagnosis not present

## 2022-03-01 MED ORDER — EPOETIN ALFA-EPBX 20000 UNIT/ML IJ SOLN
20000.0000 [IU] | Freq: Once | INTRAMUSCULAR | Status: AC
  Administered 2022-03-01: 20000 [IU] via SUBCUTANEOUS
  Filled 2022-03-01: qty 1

## 2022-03-01 MED ORDER — CYANOCOBALAMIN 1000 MCG/ML IJ SOLN
1000.0000 ug | Freq: Once | INTRAMUSCULAR | Status: AC
  Administered 2022-03-01: 1000 ug via INTRAMUSCULAR
  Filled 2022-03-01: qty 1

## 2022-03-21 ENCOUNTER — Other Ambulatory Visit: Payer: Self-pay | Admitting: Oncology

## 2022-03-21 DIAGNOSIS — D638 Anemia in other chronic diseases classified elsewhere: Secondary | ICD-10-CM

## 2022-03-21 NOTE — Progress Notes (Signed)
[facility]  [address] [place],  [place]  [address] [phone]  Clinic Day:  03/22/2022  Referring physician: Earlyne Iba, NP  HISTORY OF PRESENT ILLNESS:  The patient is an 82 y.o. female with a history of anemia secondary to chronic disease.   However, recent labs also showed her to be iron deficient for which she recently received IV iron.  She also has a history of vitamin B12 deficiency, for which she receives monthly B12 injections to maintain her cobalamin stores.  She comes in today to reassess her hemoglobin.  Since her last visit, the patient claims to be doing fairly well.  She denies having increased fatigue or any overt forms of blood loss which concern her for progressive anemia.  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  PHYSICAL EXAM:  Blood pressure (!) 151/74, pulse 62, temperature 98.7 F (37.1 C), resp. rate 16, height 5\' 4"  (1.626 m), weight 163 lb 14.4 oz (74.3 kg), SpO2 96 %. Wt Readings from Last 3 Encounters:  03/22/22 163 lb 14.4 oz (74.3 kg)  03/01/22 172 lb 1.9 oz (78.1 kg)  01/26/22 159 lb 1.9 oz (72.2 kg)   Body mass index is 28.13 kg/m. Performance status (ECOG): 1 Physical Exam Constitutional:      Appearance: Normal appearance. She is not ill-appearing.  HENT:     Mouth/Throat:     Mouth: Mucous membranes are moist.     Pharynx: Oropharynx is clear. No oropharyngeal exudate or posterior oropharyngeal erythema.  Cardiovascular:     Rate and Rhythm: Normal rate and regular rhythm.     Heart sounds: No murmur heard.    No friction rub. No gallop.  Pulmonary:     Effort: Pulmonary effort is normal. No respiratory distress.     Breath sounds: Normal breath sounds. No wheezing, rhonchi or rales.  Abdominal:     General: Bowel sounds are normal. There is no distension.     Palpations: Abdomen is soft. There is no mass.     Tenderness: There is no abdominal tenderness.   Musculoskeletal:        General: No swelling.     Right lower leg: No edema.     Left lower leg: No edema.  Lymphadenopathy:     Cervical: No cervical adenopathy.     Upper Body:     Right upper body: No supraclavicular or axillary adenopathy.     Left upper body: No supraclavicular or axillary adenopathy.     Lower Body: No right inguinal adenopathy. No left inguinal adenopathy.  Skin:    General: Skin is warm.     Coloration: Skin is not jaundiced.     Findings: No lesion or rash.  Neurological:     General: No focal deficit present.     Mental Status: She is alert and oriented to person, place, and time. Mental status is at baseline.  Psychiatric:        Mood and Affect: Mood normal.        Behavior: Behavior normal.        Thought Content: Thought content normal.    LABS:      Latest Ref Rng & Units 03/22/2022   12:00 AM 02/27/2022   11:13 AM 01/24/2022   10:58 AM  CBC  WBC  7.6     9.0  7.4   Hemoglobin 12.0 - 16.0 10.4     10.2  10.3  Hematocrit 36 - 46 32     32.5  32.9   Platelets 150 - 400 K/uL 357     346  322      This result is from an external source.      Latest Ref Rng & Units 03/22/2022   11:06 AM 11/21/2021   11:26 AM 10/25/2021   12:00 AM  CMP  Glucose 70 - 99 mg/dL 96  77    BUN 8 - 23 mg/dL 12  18  9       Creatinine 0.44 - 1.00 mg/dL 0.75  0.89  0.6      Sodium 135 - 145 mmol/L 136  138  134      Potassium 3.5 - 5.1 mmol/L 3.5  4.0  4.2      Chloride 98 - 111 mmol/L 101  102  103      CO2 22 - 32 mmol/L 28  27  26       Calcium 8.9 - 10.3 mg/dL 8.9  9.1  8.8      Total Protein 6.5 - 8.1 g/dL 7.9  7.8    Total Bilirubin 0.3 - 1.2 mg/dL 0.8  0.6    Alkaline Phos 38 - 126 U/L 93  95  107      AST 15 - 41 U/L 14  14  23       ALT 0 - 44 U/L 9  9  11          This result is from an external source.    Latest Reference Range & Units 03/22/22 11:06  Iron 28 - 170 ug/dL 44  UIBC ug/dL 223  TIBC 250 - 450 ug/dL 267  Saturation Ratios 10.4 - 31.8  % 17  Ferritin 11 - 307 ng/mL 214  Folate >5.9 ng/mL >40.0  Vitamin B12 180 - 914 pg/mL 503    ASSESSMENT & PLAN:  Assessment/Plan:  A 82 y.o. female with anemia of chronic disease, as well as iron and B12 deficiency.  Her hemoglobin of 10.4 today is the highest it has been in numerous months.  Clinically, she is doing well.  She will continue receiving her monthly B12 injections for her history of vitamin B12 deficiency.  Her hemoglobin will be checked every 2 months.  I will see her back in 4 months to reassess her anemia.  The patient understands all the plans discussed today and is in agreement with them.   Shoua Ressler Macarthur Critchley, MD

## 2022-03-22 ENCOUNTER — Inpatient Hospital Stay: Payer: Medicare HMO | Attending: Oncology

## 2022-03-22 ENCOUNTER — Inpatient Hospital Stay: Payer: Medicare HMO | Admitting: Oncology

## 2022-03-22 ENCOUNTER — Other Ambulatory Visit: Payer: Self-pay | Admitting: Oncology

## 2022-03-22 VITALS — BP 151/74 | HR 62 | Temp 98.7°F | Resp 16 | Ht 64.0 in | Wt 163.9 lb

## 2022-03-22 DIAGNOSIS — E538 Deficiency of other specified B group vitamins: Secondary | ICD-10-CM | POA: Insufficient documentation

## 2022-03-22 DIAGNOSIS — D649 Anemia, unspecified: Secondary | ICD-10-CM | POA: Diagnosis not present

## 2022-03-22 DIAGNOSIS — N189 Chronic kidney disease, unspecified: Secondary | ICD-10-CM

## 2022-03-22 DIAGNOSIS — E611 Iron deficiency: Secondary | ICD-10-CM | POA: Insufficient documentation

## 2022-03-22 DIAGNOSIS — D638 Anemia in other chronic diseases classified elsewhere: Secondary | ICD-10-CM

## 2022-03-22 LAB — IRON AND TIBC
Iron: 44 ug/dL (ref 28–170)
Saturation Ratios: 17 % (ref 10.4–31.8)
TIBC: 267 ug/dL (ref 250–450)
UIBC: 223 ug/dL

## 2022-03-22 LAB — CMP (CANCER CENTER ONLY)
ALT: 9 U/L (ref 0–44)
AST: 14 U/L — ABNORMAL LOW (ref 15–41)
Albumin: 3.8 g/dL (ref 3.5–5.0)
Alkaline Phosphatase: 93 U/L (ref 38–126)
Anion gap: 7 (ref 5–15)
BUN: 12 mg/dL (ref 8–23)
CO2: 28 mmol/L (ref 22–32)
Calcium: 8.9 mg/dL (ref 8.9–10.3)
Chloride: 101 mmol/L (ref 98–111)
Creatinine: 0.75 mg/dL (ref 0.44–1.00)
GFR, Estimated: >60 mL/min (ref >60)
Glucose, Bld: 96 mg/dL (ref 70–99)
Potassium: 3.5 mmol/L (ref 3.5–5.1)
Sodium: 136 mmol/L (ref 135–145)
Total Bilirubin: 0.8 mg/dL (ref 0.3–1.2)
Total Protein: 7.9 g/dL (ref 6.5–8.1)

## 2022-03-22 LAB — CBC AND DIFFERENTIAL
HCT: 32 — AB (ref 36–46)
Hemoglobin: 10.4 — AB (ref 12.0–16.0)
Neutrophils Absolute: 5.24
Platelets: 357 10*3/uL (ref 150–400)
WBC: 7.6

## 2022-03-22 LAB — FOLATE: Folate: >40 ng/mL (ref >5.9)

## 2022-03-22 LAB — CBC: RBC: 3.62 — AB (ref 3.87–5.11)

## 2022-03-22 LAB — VITAMIN B12: Vitamin B-12: 503 pg/mL (ref 180–914)

## 2022-03-22 LAB — FERRITIN: Ferritin: 214 ng/mL (ref 11–307)

## 2022-03-23 ENCOUNTER — Encounter: Payer: Self-pay | Admitting: Oncology

## 2022-03-30 ENCOUNTER — Inpatient Hospital Stay: Payer: Medicare HMO

## 2022-03-30 VITALS — BP 148/67 | HR 73 | Temp 98.0°F | Resp 16 | Ht 64.0 in | Wt 164.0 lb

## 2022-03-30 DIAGNOSIS — E538 Deficiency of other specified B group vitamins: Secondary | ICD-10-CM | POA: Diagnosis not present

## 2022-03-30 DIAGNOSIS — E611 Iron deficiency: Secondary | ICD-10-CM | POA: Diagnosis not present

## 2022-03-30 MED ORDER — CYANOCOBALAMIN 1000 MCG/ML IJ SOLN
1000.0000 ug | Freq: Once | INTRAMUSCULAR | Status: AC
  Administered 2022-03-30: 1000 ug via INTRAMUSCULAR
  Filled 2022-03-30: qty 1

## 2022-04-11 DIAGNOSIS — R296 Repeated falls: Secondary | ICD-10-CM | POA: Diagnosis not present

## 2022-04-11 DIAGNOSIS — M1991 Primary osteoarthritis, unspecified site: Secondary | ICD-10-CM | POA: Diagnosis not present

## 2022-04-11 DIAGNOSIS — Z6827 Body mass index (BMI) 27.0-27.9, adult: Secondary | ICD-10-CM | POA: Diagnosis not present

## 2022-04-11 DIAGNOSIS — E663 Overweight: Secondary | ICD-10-CM | POA: Diagnosis not present

## 2022-04-11 DIAGNOSIS — M0579 Rheumatoid arthritis with rheumatoid factor of multiple sites without organ or systems involvement: Secondary | ICD-10-CM | POA: Diagnosis not present

## 2022-04-16 ENCOUNTER — Encounter: Payer: Self-pay | Admitting: Oncology

## 2022-04-19 DIAGNOSIS — M069 Rheumatoid arthritis, unspecified: Secondary | ICD-10-CM | POA: Diagnosis not present

## 2022-04-19 DIAGNOSIS — E559 Vitamin D deficiency, unspecified: Secondary | ICD-10-CM | POA: Diagnosis not present

## 2022-04-19 DIAGNOSIS — K2100 Gastro-esophageal reflux disease with esophagitis, without bleeding: Secondary | ICD-10-CM | POA: Diagnosis not present

## 2022-04-19 DIAGNOSIS — I4891 Unspecified atrial fibrillation: Secondary | ICD-10-CM | POA: Diagnosis not present

## 2022-04-19 DIAGNOSIS — E0590 Thyrotoxicosis, unspecified without thyrotoxic crisis or storm: Secondary | ICD-10-CM | POA: Diagnosis not present

## 2022-04-19 DIAGNOSIS — E538 Deficiency of other specified B group vitamins: Secondary | ICD-10-CM | POA: Diagnosis not present

## 2022-04-19 DIAGNOSIS — E785 Hyperlipidemia, unspecified: Secondary | ICD-10-CM | POA: Diagnosis not present

## 2022-04-19 DIAGNOSIS — I10 Essential (primary) hypertension: Secondary | ICD-10-CM | POA: Diagnosis not present

## 2022-04-26 ENCOUNTER — Encounter: Payer: Self-pay | Admitting: Oncology

## 2022-04-27 ENCOUNTER — Inpatient Hospital Stay: Payer: Medicare HMO | Attending: Oncology

## 2022-04-27 DIAGNOSIS — D638 Anemia in other chronic diseases classified elsewhere: Secondary | ICD-10-CM | POA: Diagnosis not present

## 2022-04-27 DIAGNOSIS — D649 Anemia, unspecified: Secondary | ICD-10-CM | POA: Diagnosis not present

## 2022-04-27 DIAGNOSIS — E538 Deficiency of other specified B group vitamins: Secondary | ICD-10-CM | POA: Insufficient documentation

## 2022-04-27 LAB — CBC AND DIFFERENTIAL
HCT: 32 — AB (ref 36–46)
Hemoglobin: 10.4 — AB (ref 12.0–16.0)
Neutrophils Absolute: 4.55
Platelets: 312 10*3/uL (ref 150–400)
WBC: 7

## 2022-04-27 LAB — CBC: RBC: 3.59 — AB (ref 3.87–5.11)

## 2022-04-30 ENCOUNTER — Inpatient Hospital Stay: Payer: Medicare HMO

## 2022-04-30 VITALS — BP 155/62 | HR 62 | Temp 98.0°F | Resp 18 | Ht 64.0 in | Wt 165.2 lb

## 2022-04-30 DIAGNOSIS — E538 Deficiency of other specified B group vitamins: Secondary | ICD-10-CM | POA: Diagnosis not present

## 2022-04-30 MED ORDER — CYANOCOBALAMIN 1000 MCG/ML IJ SOLN
1000.0000 ug | Freq: Once | INTRAMUSCULAR | Status: AC
  Administered 2022-04-30: 1000 ug via INTRAMUSCULAR
  Filled 2022-04-30: qty 1

## 2022-04-30 NOTE — Patient Instructions (Signed)

## 2022-05-22 ENCOUNTER — Encounter: Payer: Self-pay | Admitting: Hematology and Oncology

## 2022-05-22 ENCOUNTER — Inpatient Hospital Stay: Payer: Medicare HMO | Admitting: Hematology and Oncology

## 2022-05-22 ENCOUNTER — Ambulatory Visit: Payer: Medicare HMO | Admitting: Oncology

## 2022-05-22 ENCOUNTER — Inpatient Hospital Stay: Payer: Medicare HMO | Attending: Oncology

## 2022-05-22 VITALS — BP 168/82 | HR 62 | Temp 98.1°F | Resp 20 | Ht 64.0 in | Wt 153.3 lb

## 2022-05-22 DIAGNOSIS — E538 Deficiency of other specified B group vitamins: Secondary | ICD-10-CM | POA: Insufficient documentation

## 2022-05-22 DIAGNOSIS — M7989 Other specified soft tissue disorders: Secondary | ICD-10-CM | POA: Diagnosis not present

## 2022-05-22 DIAGNOSIS — D638 Anemia in other chronic diseases classified elsewhere: Secondary | ICD-10-CM

## 2022-05-22 LAB — CBC AND DIFFERENTIAL
HCT: 32 — AB (ref 36–46)
Hemoglobin: 10.5 — AB (ref 12.0–16.0)
Neutrophils Absolute: 5.58
Platelets: 324 10*3/uL (ref 150–400)
WBC: 9

## 2022-05-22 LAB — CBC: RBC: 3.62 — AB (ref 3.87–5.11)

## 2022-05-22 NOTE — Progress Notes (Cosign Needed)
[facility]  [address] 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   She was found to be iron deficient so received IV iron in December with normalization of her iron stores.  She also has a history of vitamin B12 deficiency, for which she receives monthly B12 injections to maintain her cobalamin stores.  Retacrit has been on hold as her hemoglobin has been above 10.  She comes in today to reassess her hemoglobin.  Since her last visit, the patient claims to be doing fairly well.  She denies having increased fatigue or any overt forms of blood loss which concern her for progressive anemia.  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  When asked about the swelling of her right leg, she states it has been like that for about a week.  She denies significant pain of the leg.  PHYSICAL EXAM:  Blood pressure (!) 168/82, pulse 62, temperature 98.1 F (36.7 C), temperature source Oral, resp. rate 20, height 5\' 4"  (1.626 m), weight 153 lb 4.8 oz (69.5 kg), SpO2 98 %. Wt Readings from Last 3 Encounters:  05/22/22 153 lb 4.8 oz (69.5 kg)  04/30/22 165 lb 4 oz (75 kg)  03/30/22 164 lb 0.6 oz (74.4 kg)   Body mass index is 26.31 kg/m.  Performance status (ECOG): 1 - Symptomatic but completely ambulatory  Physical Exam Vitals and nursing note reviewed.  Constitutional:      General: She is not in acute distress.    Appearance: Normal appearance.  HENT:     Head: Normocephalic and atraumatic.     Mouth/Throat:     Mouth: Mucous membranes are moist.     Pharynx: Oropharynx is clear. No oropharyngeal exudate or posterior oropharyngeal erythema.  Eyes:     General: No scleral icterus.    Extraocular Movements: Extraocular movements intact.      Conjunctiva/sclera: Conjunctivae normal.     Pupils: Pupils are equal, round, and reactive to light.  Cardiovascular:     Rate and Rhythm: Normal rate and regular rhythm.     Heart sounds: Normal heart sounds. No murmur heard.    No friction rub. No gallop.  Pulmonary:     Effort: Pulmonary effort is normal.     Breath sounds: Normal breath sounds. No wheezing, rhonchi or rales.  Abdominal:     General: There is no distension.     Palpations: Abdomen is soft. There is no hepatomegaly, splenomegaly or mass.     Tenderness: There is no abdominal tenderness.  Musculoskeletal:        General: Normal range of motion.     Cervical back: Normal range of motion and neck supple. No tenderness.     Right lower leg: Tenderness present. Edema (1+) present.     Left lower leg: No edema.  Lymphadenopathy:     Cervical: No cervical adenopathy.     Upper Body:     Right upper body: No supraclavicular or axillary adenopathy.     Left upper body: No supraclavicular or axillary adenopathy.  Skin:    General: Skin is warm and dry.     Coloration: Skin is not jaundiced.     Findings: No rash.  Neurological:     Mental Status: She is  alert and oriented to person, place, and time.     Cranial Nerves: No cranial nerve deficit.  Psychiatric:        Mood and Affect: Mood normal.        Behavior: Behavior normal.        Thought Content: Thought content normal.     LABS:      Latest Ref Rng & Units 05/22/2022   12:00 AM 04/27/2022   12:00 AM 03/22/2022   12:00 AM  CBC  WBC  9.0     7.0     7.6      Hemoglobin 12.0 - 16.0 10.5     10.4     10.4      Hematocrit 36 - 46 32     32     32      Platelets 150 - 400 K/uL 324     312     357         This result is from an external source.      Latest Ref Rng & Units 03/22/2022   11:06 AM 11/21/2021   11:26 AM 10/25/2021   12:00 AM  CMP  Glucose 70 - 99 mg/dL 96  77    BUN 8 - 23 mg/dL 12  18  9       Creatinine 0.44 - 1.00 mg/dL 1.61  0.96  0.6       Sodium 135 - 145 mmol/L 136  138  134      Potassium 3.5 - 5.1 mmol/L 3.5  4.0  4.2      Chloride 98 - 111 mmol/L 101  102  103      CO2 22 - 32 mmol/L 28  27  26       Calcium 8.9 - 10.3 mg/dL 8.9  9.1  8.8      Total Protein 6.5 - 8.1 g/dL 7.9  7.8    Total Bilirubin 0.3 - 1.2 mg/dL 0.8  0.6    Alkaline Phos 38 - 126 U/L 93  95  107      AST 15 - 41 U/L 14  14  23       ALT 0 - 44 U/L 9  9  11          This result is from an external source.     No results found for: "CEA1", "CEA" / No results found for: "CEA1", "CEA" No results found for: "PSA1" No results found for: "EAV409" No results found for: "CAN125"  No results found for: "TOTALPROTELP", "ALBUMINELP", "A1GS", "A2GS", "BETS", "BETA2SER", "GAMS", "MSPIKE", "SPEI" Lab Results  Component Value Date   TIBC 267 03/22/2022   TIBC 286 12/21/2021   TIBC 358 11/21/2021   FERRITIN 214 03/22/2022   FERRITIN 349 (H) 12/21/2021   FERRITIN 52 11/21/2021   IRONPCTSAT 17 03/22/2022   IRONPCTSAT 41 (H) 12/21/2021   IRONPCTSAT 7 (L) 11/21/2021   No results found for: "LDH"  No results found for: "AFPTUMOR", "TOTALPROTELP", "ALBUMINELP", "A1GS", "A2GS", "BETS", "BETA2SER", "GAMS", "MSPIKE", "SPEI", "LDH", "CEA1", "CEA", "PSA1", "IGASERUM", "IGGSERUM", "IGMSERUM", "THGAB", "THYROGLB"  Review Flowsheet  More data exists      Latest Ref Rng & Units 11/21/2021 12/21/2021 03/22/2022  Oncology Labs  Ferritin 11 - 307 ng/mL 52  349  214   %SAT 10.4 - 31.8 % 7  41  17      STUDIES:  No results found.    ASSESSMENT & PLAN:   Assessment/Plan:  82 y.o.  female with anemia of chronic disease, as B12 deficiency.  Retacrit has been on hold as her hemoglobin has been above 10.  She continues monthly B12 injections.  She has new swelling of the right lower extremity, so will obtain a venous Doppler ultrasound of the right lower extremity to evaluate for deep venous thrombosis.  When taken manually, her blood pressure was better, but I still  encouraged her to see her primary care regarding her hypertension. We will plan to see her back in 2 months for repeat clinical assessment.  The patient and her caregiver understand all the plans discussed today and are in agreement with them.   They know to contact our office if she develops concerns prior to her next appointment.    Adah Perl, PA-C

## 2022-05-25 ENCOUNTER — Other Ambulatory Visit: Payer: Medicare HMO

## 2022-05-30 ENCOUNTER — Encounter: Payer: Self-pay | Admitting: Hematology and Oncology

## 2022-05-30 ENCOUNTER — Inpatient Hospital Stay: Payer: Medicare HMO

## 2022-05-30 VITALS — BP 157/71 | HR 77 | Temp 97.5°F | Resp 18 | Ht 64.0 in | Wt 154.8 lb

## 2022-05-30 DIAGNOSIS — E538 Deficiency of other specified B group vitamins: Secondary | ICD-10-CM | POA: Diagnosis not present

## 2022-05-30 MED ORDER — CYANOCOBALAMIN 1000 MCG/ML IJ SOLN
1000.0000 ug | Freq: Once | INTRAMUSCULAR | Status: AC
  Administered 2022-05-30: 1000 ug via INTRAMUSCULAR
  Filled 2022-05-30: qty 1

## 2022-05-30 NOTE — Patient Instructions (Signed)

## 2022-06-29 ENCOUNTER — Other Ambulatory Visit: Payer: Medicare HMO

## 2022-06-30 ENCOUNTER — Encounter: Payer: Self-pay | Admitting: Oncology

## 2022-07-02 ENCOUNTER — Inpatient Hospital Stay: Payer: Medicare HMO | Attending: Oncology

## 2022-07-02 VITALS — BP 172/72 | HR 67 | Temp 97.6°F | Resp 12 | Ht 64.0 in | Wt 154.0 lb

## 2022-07-02 DIAGNOSIS — D638 Anemia in other chronic diseases classified elsewhere: Secondary | ICD-10-CM | POA: Diagnosis not present

## 2022-07-02 DIAGNOSIS — E538 Deficiency of other specified B group vitamins: Secondary | ICD-10-CM | POA: Insufficient documentation

## 2022-07-02 MED ORDER — CYANOCOBALAMIN 1000 MCG/ML IJ SOLN
1000.0000 ug | Freq: Once | INTRAMUSCULAR | Status: AC
  Administered 2022-07-02: 1000 ug via INTRAMUSCULAR
  Filled 2022-07-02: qty 1

## 2022-07-03 ENCOUNTER — Encounter: Payer: Self-pay | Admitting: Oncology

## 2022-07-19 ENCOUNTER — Telehealth: Payer: Self-pay | Admitting: Oncology

## 2022-07-19 NOTE — Telephone Encounter (Signed)
Contacted pt to see if she would be willing to come in next Monday or Tuesday for labs in order to update the authorization for Retacrit injection since insurance will require up to date labs. Unable to reach via phone, lvm requesting she call me back.

## 2022-07-27 DIAGNOSIS — K922 Gastrointestinal hemorrhage, unspecified: Secondary | ICD-10-CM | POA: Diagnosis not present

## 2022-07-27 DIAGNOSIS — E785 Hyperlipidemia, unspecified: Secondary | ICD-10-CM | POA: Diagnosis not present

## 2022-07-27 DIAGNOSIS — I480 Paroxysmal atrial fibrillation: Secondary | ICD-10-CM | POA: Diagnosis not present

## 2022-07-27 DIAGNOSIS — D649 Anemia, unspecified: Secondary | ICD-10-CM | POA: Diagnosis not present

## 2022-07-27 DIAGNOSIS — I10 Essential (primary) hypertension: Secondary | ICD-10-CM | POA: Diagnosis not present

## 2022-07-30 NOTE — Progress Notes (Unsigned)
[facility]  [address] [place],  [place]  [address] [phone]  Clinic Day:  7/29//2024  Referring physician: Jim Like, NP  HISTORY OF PRESENT ILLNESS:  The patient is an 82 y.o. female with a history of anemia secondary to chronic disease.   However, recent labs also showed her to be iron deficient for which she has received IV iron.  She also has a history of vitamin B12 deficiency, for which she receives monthly B12 injections to maintain her cobalamin stores.  She comes in today to reassess her hemoglobin.  Since her last visit, the patient claims to be doing fairly well.  She denies having increased fatigue or any overt forms of blood loss which concern her for progressive anemia.  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  PHYSICAL EXAM:  Blood pressure (!) 180/79, pulse 65, temperature 97.7 F (36.5 C), resp. rate 16, height 5\' 4"  (1.626 m), weight 157 lb 3.2 oz (71.3 kg), SpO2 90%. Wt Readings from Last 3 Encounters:  07/31/22 157 lb 3.2 oz (71.3 kg)  07/02/22 154 lb (69.9 kg)  05/30/22 154 lb 12 oz (70.2 kg)   Body mass index is 26.98 kg/m. Performance status (ECOG): 1 Physical Exam Constitutional:      Appearance: Normal appearance. She is not ill-appearing.  HENT:     Mouth/Throat:     Mouth: Mucous membranes are moist.     Pharynx: Oropharynx is clear. No oropharyngeal exudate or posterior oropharyngeal erythema.  Cardiovascular:     Rate and Rhythm: Normal rate and regular rhythm.     Heart sounds: No murmur heard.    No friction rub. No gallop.  Pulmonary:     Effort: Pulmonary effort is normal. No respiratory distress.     Breath sounds: Normal breath sounds. No wheezing, rhonchi or rales.  Abdominal:     General: Bowel sounds are normal. There is no distension.     Palpations: Abdomen is soft. There is no mass.     Tenderness: There is no abdominal tenderness.   Musculoskeletal:        General: No swelling.     Right lower leg: No edema.     Left lower leg: No edema.  Lymphadenopathy:     Cervical: No cervical adenopathy.     Upper Body:     Right upper body: No supraclavicular or axillary adenopathy.     Left upper body: No supraclavicular or axillary adenopathy.     Lower Body: No right inguinal adenopathy. No left inguinal adenopathy.  Skin:    General: Skin is warm.     Coloration: Skin is not jaundiced.     Findings: No lesion or rash.  Neurological:     General: No focal deficit present.     Mental Status: She is alert and oriented to person, place, and time. Mental status is at baseline.  Psychiatric:        Mood and Affect: Mood normal.        Behavior: Behavior normal.        Thought Content: Thought content normal.   LABS:      Latest Ref Rng & Units 07/31/2022   12:00 AM 05/22/2022   12:00 AM 04/27/2022   12:00 AM  CBC  WBC  7.2     9.0     7.0      Hemoglobin 12.0 - 16.0 10.3     10.5  10.4      Hematocrit 36 - 46 32     32     32      Platelets 150 - 400 K/uL 306     324     312         This result is from an external source.      Latest Ref Rng & Units 03/22/2022   11:06 AM 11/21/2021   11:26 AM 10/25/2021   12:00 AM  CMP  Glucose 70 - 99 mg/dL 96  77    BUN 8 - 23 mg/dL 12  18  9       Creatinine 0.44 - 1.00 mg/dL 1.32  4.40  0.6      Sodium 135 - 145 mmol/L 136  138  134      Potassium 3.5 - 5.1 mmol/L 3.5  4.0  4.2      Chloride 98 - 111 mmol/L 101  102  103      CO2 22 - 32 mmol/L 28  27  26       Calcium 8.9 - 10.3 mg/dL 8.9  9.1  8.8      Total Protein 6.5 - 8.1 g/dL 7.9  7.8    Total Bilirubin 0.3 - 1.2 mg/dL 0.8  0.6    Alkaline Phos 38 - 126 U/L 93  95  107      AST 15 - 41 U/L 14  14  23       ALT 0 - 44 U/L 9  9  11          This result is from an external source.    Latest Reference Range & Units 07/31/22 10:10  Iron 28 - 170 ug/dL 31  UIBC ug/dL 102  TIBC 725 - 366 ug/dL 440  Saturation  Ratios 10.4 - 31.8 % 12  Ferritin 11 - 307 ng/mL 233    ASSEiSSMENT & PLAN:  Assessment/Plan:  An 82 y.o. female with anemia of chronic disease, as well as iron and B12 deficiency.  Her hemoglobin of 10.3 is similar to what it has been in numerous months.  Clinically, she is doing well.  She will continue receiving her monthly B12 injections for her history of vitamin B12 deficiency.   I will see her back in 4 months to reassess her anemia.  The patient understands all the plans discussed today and is in agreement with them.   [place]ul[place] Kirby Funk, MD

## 2022-07-31 ENCOUNTER — Other Ambulatory Visit: Payer: Medicare HMO

## 2022-07-31 ENCOUNTER — Inpatient Hospital Stay: Payer: Medicare HMO | Admitting: Oncology

## 2022-07-31 ENCOUNTER — Other Ambulatory Visit: Payer: Self-pay | Admitting: Oncology

## 2022-07-31 ENCOUNTER — Inpatient Hospital Stay: Payer: Medicare HMO

## 2022-07-31 VITALS — BP 180/79 | HR 65 | Temp 97.7°F | Resp 16 | Ht 64.0 in | Wt 157.2 lb

## 2022-07-31 DIAGNOSIS — E538 Deficiency of other specified B group vitamins: Secondary | ICD-10-CM | POA: Diagnosis not present

## 2022-07-31 DIAGNOSIS — D638 Anemia in other chronic diseases classified elsewhere: Secondary | ICD-10-CM

## 2022-07-31 DIAGNOSIS — D649 Anemia, unspecified: Secondary | ICD-10-CM

## 2022-07-31 LAB — CMP (CANCER CENTER ONLY)
ALT: 9 U/L (ref 0–44)
AST: 15 U/L (ref 15–41)
Albumin: 3.5 g/dL (ref 3.5–5.0)
Alkaline Phosphatase: 99 U/L (ref 38–126)
Anion gap: 8 (ref 5–15)
BUN: 14 mg/dL (ref 8–23)
CO2: 29 mmol/L (ref 22–32)
Calcium: 9.1 mg/dL (ref 8.9–10.3)
Chloride: 100 mmol/L (ref 98–111)
Creatinine: 0.78 mg/dL (ref 0.44–1.00)
GFR, Estimated: >60 mL/min (ref >60)
Glucose, Bld: 88 mg/dL (ref 70–99)
Potassium: 3.4 mmol/L — ABNORMAL LOW (ref 3.5–5.1)
Sodium: 137 mmol/L (ref 135–145)
Total Bilirubin: 0.4 mg/dL (ref 0.3–1.2)
Total Protein: 7.5 g/dL (ref 6.5–8.1)

## 2022-07-31 LAB — CBC: RBC: 3.57 — AB (ref 3.87–5.11)

## 2022-07-31 LAB — CBC AND DIFFERENTIAL
HCT: 32 — AB (ref 36–46)
Hemoglobin: 10.3 — AB (ref 12.0–16.0)
Neutrophils Absolute: 4.61
Platelets: 306 10*3/uL (ref 150–400)
WBC: 7.2

## 2022-07-31 LAB — IRON AND TIBC
Iron: 31 ug/dL (ref 28–170)
Saturation Ratios: 12 % (ref 10.4–31.8)
TIBC: 251 ug/dL (ref 250–450)
UIBC: 220 ug/dL

## 2022-07-31 LAB — FERRITIN: Ferritin: 233 ng/mL (ref 11–307)

## 2022-08-02 ENCOUNTER — Ambulatory Visit: Payer: Medicare HMO

## 2022-08-08 ENCOUNTER — Encounter: Payer: Self-pay | Admitting: Oncology

## 2022-08-09 ENCOUNTER — Inpatient Hospital Stay: Payer: Medicare HMO | Attending: Oncology

## 2022-08-09 VITALS — BP 171/63 | HR 67 | Temp 98.0°F | Resp 18 | Ht 64.0 in | Wt 155.0 lb

## 2022-08-09 DIAGNOSIS — E538 Deficiency of other specified B group vitamins: Secondary | ICD-10-CM | POA: Diagnosis not present

## 2022-08-09 DIAGNOSIS — D638 Anemia in other chronic diseases classified elsewhere: Secondary | ICD-10-CM

## 2022-08-09 MED ORDER — CYANOCOBALAMIN 1000 MCG/ML IJ SOLN
1000.0000 ug | Freq: Once | INTRAMUSCULAR | Status: AC
  Administered 2022-08-09: 1000 ug via INTRAMUSCULAR
  Filled 2022-08-09: qty 1

## 2022-08-09 NOTE — Patient Instructions (Signed)
Vitamin B12 Injection What is this medication? Vitamin B12 (VAHY tuh min B12) prevents and treats low vitamin B12 levels in your body. It is used in people who do not get enough vitamin B12 from their diet or when their digestive tract does not absorb enough. Vitamin B12 plays an important role in maintaining the health of your nervous system and red blood cells. This medicine may be used for other purposes; ask your health care provider or pharmacist if you have questions. COMMON BRAND NAME(S): B-12 Compliance Kit, B-12 Injection Kit, Cyomin, Dodex, LA-12, Nutri-Twelve, Physicians EZ Use B-12, Primabalt, Vitamin Deficiency Injectable System - B12 What should I tell my care team before I take this medication? They need to know if you have any of these conditions: Kidney disease Leber's disease Megaloblastic anemia An unusual or allergic reaction to cyanocobalamin, cobalt, other medications, foods, dyes, or preservatives Pregnant or trying to get pregnant Breast-feeding How should I use this medication? This medication is injected into a muscle or deeply under the skin. It is usually given in a clinic or care team's office. However, your care team may teach you how to inject yourself. Follow all instructions. Talk to your care team about the use of this medication in children. Special care may be needed. Overdosage: If you think you have taken too much of this medicine contact a poison control center or emergency room at once. NOTE: This medicine is only for you. Do not share this medicine with others. What if I miss a dose? If you are given your dose at a clinic or care team's office, call to reschedule your appointment. If you give your own injections, and you miss a dose, take it as soon as you can. If it is almost time for your next dose, take only that dose. Do not take double or extra doses. What may interact with this medication? Alcohol Colchicine This list may not describe all possible  interactions. Give your health care provider a list of all the medicines, herbs, non-prescription drugs, or dietary supplements you use. Also tell them if you smoke, drink alcohol, or use illegal drugs. Some items may interact with your medicine. What should I watch for while using this medication? Visit your care team regularly. You may need blood work done while you are taking this medication. You may need to follow a special diet. Talk to your care team. Limit your alcohol intake and avoid smoking to get the best benefit. What side effects may I notice from receiving this medication? Side effects that you should report to your care team as soon as possible: Allergic reactions--skin rash, itching, hives, swelling of the face, lips, tongue, or throat Swelling of the ankles, hands, or feet Trouble breathing Side effects that usually do not require medical attention (report to your care team if they continue or are bothersome): Diarrhea This list may not describe all possible side effects. Call your doctor for medical advice about side effects. You may report side effects to FDA at 1-800-FDA-1088. Where should I keep my medication? Keep out of the reach of children. Store at room temperature between 15 and 30 degrees C (59 and 85 degrees F). Protect from light. Throw away any unused medication after the expiration date. NOTE: This sheet is a summary. It may not cover all possible information. If you have questions about this medicine, talk to your doctor, pharmacist, or health care provider.  2024 Elsevier/Gold Standard (2020-08-30 00:00:00)

## 2022-08-27 DIAGNOSIS — Z1231 Encounter for screening mammogram for malignant neoplasm of breast: Secondary | ICD-10-CM | POA: Diagnosis not present

## 2022-08-28 ENCOUNTER — Inpatient Hospital Stay: Payer: Medicare HMO

## 2022-08-28 DIAGNOSIS — E538 Deficiency of other specified B group vitamins: Secondary | ICD-10-CM | POA: Diagnosis not present

## 2022-08-28 LAB — CBC WITH DIFFERENTIAL (CANCER CENTER ONLY)
Abs Immature Granulocytes: 0.06 10*3/uL (ref 0.00–0.07)
Basophils Absolute: 0.1 10*3/uL (ref 0.0–0.1)
Basophils Relative: 1 %
Eosinophils Absolute: 0.4 10*3/uL (ref 0.0–0.5)
Eosinophils Relative: 6 %
HCT: 32.4 % — ABNORMAL LOW (ref 36.0–46.0)
Hemoglobin: 10.3 g/dL — ABNORMAL LOW (ref 12.0–15.0)
Immature Granulocytes: 1 %
Lymphocytes Relative: 19 %
Lymphs Abs: 1.4 10*3/uL (ref 0.7–4.0)
MCH: 28.9 pg (ref 26.0–34.0)
MCHC: 31.8 g/dL (ref 30.0–36.0)
MCV: 91 fL (ref 80.0–100.0)
Monocytes Absolute: 0.9 10*3/uL (ref 0.1–1.0)
Monocytes Relative: 12 %
Neutro Abs: 4.4 10*3/uL (ref 1.7–7.7)
Neutrophils Relative %: 61 %
Platelet Count: 305 10*3/uL (ref 150–400)
RBC: 3.56 MIL/uL — ABNORMAL LOW (ref 3.87–5.11)
RDW: 15 % (ref 11.5–15.5)
WBC Count: 7.2 10*3/uL (ref 4.0–10.5)
nRBC: 0 % (ref 0.0–0.2)

## 2022-08-30 ENCOUNTER — Inpatient Hospital Stay: Payer: Medicare HMO

## 2022-09-04 DIAGNOSIS — D0501 Lobular carcinoma in situ of right breast: Secondary | ICD-10-CM | POA: Diagnosis not present

## 2022-09-19 DIAGNOSIS — M1991 Primary osteoarthritis, unspecified site: Secondary | ICD-10-CM | POA: Diagnosis not present

## 2022-09-19 DIAGNOSIS — E663 Overweight: Secondary | ICD-10-CM | POA: Diagnosis not present

## 2022-09-19 DIAGNOSIS — Z6825 Body mass index (BMI) 25.0-25.9, adult: Secondary | ICD-10-CM | POA: Diagnosis not present

## 2022-09-19 DIAGNOSIS — M0579 Rheumatoid arthritis with rheumatoid factor of multiple sites without organ or systems involvement: Secondary | ICD-10-CM | POA: Diagnosis not present

## 2022-09-19 DIAGNOSIS — R296 Repeated falls: Secondary | ICD-10-CM | POA: Diagnosis not present

## 2022-09-25 ENCOUNTER — Inpatient Hospital Stay: Payer: Medicare HMO | Attending: Oncology

## 2022-09-25 DIAGNOSIS — E538 Deficiency of other specified B group vitamins: Secondary | ICD-10-CM | POA: Insufficient documentation

## 2022-09-25 DIAGNOSIS — D638 Anemia in other chronic diseases classified elsewhere: Secondary | ICD-10-CM | POA: Insufficient documentation

## 2022-09-25 LAB — CBC WITH DIFFERENTIAL (CANCER CENTER ONLY)
Abs Immature Granulocytes: 0.04 10*3/uL (ref 0.00–0.07)
Basophils Absolute: 0.1 10*3/uL (ref 0.0–0.1)
Basophils Relative: 1 %
Eosinophils Absolute: 0.5 10*3/uL (ref 0.0–0.5)
Eosinophils Relative: 8 %
HCT: 30.8 % — ABNORMAL LOW (ref 36.0–46.0)
Hemoglobin: 9.7 g/dL — ABNORMAL LOW (ref 12.0–15.0)
Immature Granulocytes: 1 %
Lymphocytes Relative: 17 %
Lymphs Abs: 1.2 10*3/uL (ref 0.7–4.0)
MCH: 28.1 pg (ref 26.0–34.0)
MCHC: 31.5 g/dL (ref 30.0–36.0)
MCV: 89.3 fL (ref 80.0–100.0)
Monocytes Absolute: 0.8 10*3/uL (ref 0.1–1.0)
Monocytes Relative: 12 %
Neutro Abs: 4.2 10*3/uL (ref 1.7–7.7)
Neutrophils Relative %: 61 %
Platelet Count: 314 10*3/uL (ref 150–400)
RBC: 3.45 MIL/uL — ABNORMAL LOW (ref 3.87–5.11)
RDW: 14.2 % (ref 11.5–15.5)
WBC Count: 6.8 10*3/uL (ref 4.0–10.5)
nRBC: 0 % (ref 0.0–0.2)

## 2022-09-26 ENCOUNTER — Encounter: Payer: Self-pay | Admitting: Oncology

## 2022-09-27 ENCOUNTER — Inpatient Hospital Stay: Payer: Medicare HMO

## 2022-09-27 VITALS — BP 191/77 | HR 70 | Temp 98.0°F | Resp 22 | Ht 64.0 in | Wt 156.0 lb

## 2022-09-27 DIAGNOSIS — E538 Deficiency of other specified B group vitamins: Secondary | ICD-10-CM | POA: Diagnosis not present

## 2022-09-27 DIAGNOSIS — D638 Anemia in other chronic diseases classified elsewhere: Secondary | ICD-10-CM | POA: Diagnosis not present

## 2022-09-27 MED ORDER — EPOETIN ALFA-EPBX 20000 UNIT/ML IJ SOLN
20000.0000 [IU] | Freq: Once | INTRAMUSCULAR | Status: AC
  Administered 2022-09-27: 20000 [IU] via SUBCUTANEOUS
  Filled 2022-09-27: qty 1

## 2022-09-27 MED ORDER — CYANOCOBALAMIN 1000 MCG/ML IJ SOLN
1000.0000 ug | Freq: Once | INTRAMUSCULAR | Status: AC
  Administered 2022-09-27: 1000 ug via INTRAMUSCULAR
  Filled 2022-09-27: qty 1

## 2022-09-27 NOTE — Patient Instructions (Signed)
Epoetin Alfa Injection What is this medication? EPOETIN ALFA (e POE e tin AL fa) treats low levels of red blood cells (anemia) caused by kidney disease, chemotherapy, or HIV medications. It can also be used in people who are at risk for blood loss during surgery. It works by Systems analyst make more red blood cells, which reduces the need for blood transfusions. This medicine may be used for other purposes; ask your health care provider or pharmacist if you have questions. COMMON BRAND NAME(S): Epogen, Procrit, Retacrit What should I tell my care team before I take this medication? They need to know if you have any of these conditions: Blood clots Cancer Heart disease High blood pressure On dialysis Seizures Stroke An unusual or allergic reaction to epoetin alfa, albumin, benzyl alcohol, other medications, foods, dyes, or preservatives Pregnant or trying to get pregnant Breast-feeding How should I use this medication? This medication is injected into a vein or under the skin. It is usually given by your care team in a hospital or clinic setting. It may also be given at home. If you get this medication at home, you will be taught how to prepare and give it. Use exactly as directed. Take it as directed on the prescription label at the same time every day. Keep taking it unless your care team tells you to stop. It is important that you put your used needles and syringes in a special sharps container. Do not put them in a trash can. If you do not have a sharps container, call your pharmacist or care team to get one. A special MedGuide will be given to you by the pharmacist with each prescription and refill. Be sure to read this information carefully each time. Talk to your care team about the use of this medication in children. While this medication may be used in children as young as 1 month of age for selected conditions, precautions do apply. Overdosage: If you think you have taken too much  of this medicine contact a poison control center or emergency room at once. NOTE: This medicine is only for you. Do not share this medicine with others. What if I miss a dose? If you miss a dose, take it as soon as you can. If it is almost time for your next dose, take only that dose. Do not take double or extra doses. What may interact with this medication? Darbepoetin alfa Methoxy polyethylene glycol-epoetin beta This list may not describe all possible interactions. Give your health care provider a list of all the medicines, herbs, non-prescription drugs, or dietary supplements you use. Also tell them if you smoke, drink alcohol, or use illegal drugs. Some items may interact with your medicine. What should I watch for while using this medication? Visit your care team for regular checks on your progress. Check your blood pressure as directed. Know what your blood pressure should be and when to contact your care team. Your condition will be monitored carefully while you are receiving this medication. You may need blood work while taking this medication. What side effects may I notice from receiving this medication? Side effects that you should report to your care team as soon as possible: Allergic reactions--skin rash, itching, hives, swelling of the face, lips, tongue, or throat Blood clot--pain, swelling, or warmth in the leg, shortness of breath, chest pain Heart attack--pain or tightness in the chest, shoulders, arms, or jaw, nausea, shortness of breath, cold or clammy skin, feeling faint or lightheaded Increase  in blood pressure Rash, fever, and swollen lymph nodes Redness, blistering, peeling, or loosening of the skin, including inside the mouth Seizures Stroke--sudden numbness or weakness of the face, arm, or leg, trouble speaking, confusion, trouble walking, loss of balance or coordination, dizziness, severe headache, change in vision Side effects that usually do not require medical  attention (report to your care team if they continue or are bothersome): Bone, joint, or muscle pain Cough Headache Nausea Pain, redness, or irritation at injection site This list may not describe all possible side effects. Call your doctor for medical advice about side effects. You may report side effects to FDA at 1-800-FDA-1088. Where should I keep my medication? Keep out of the reach of children and pets. Store in a refrigerator. Do not freeze. Do not shake. Protect from light. Keep this medication in the original container until you are ready to take it. See product for storage information. Get rid of any unused medication after the expiration date. To get rid of medications that are no longer needed or have expired: Take the medication to a medication take-back program. Check with your pharmacy or law enforcement to find a location. If you cannot return the medication, ask your pharmacist or care team how to get rid of the medication safely. NOTE: This sheet is a summary. It may not cover all possible information. If you have questions about this medicine, talk to your doctor, pharmacist, or health care provider.  2024 Elsevier/Gold Standard (2021-04-21 00:00:00) Vitamin B12 Injection What is this medication? Vitamin B12 (VAHY tuh min B12) prevents and treats low vitamin B12 levels in your body. It is used in people who do not get enough vitamin B12 from their diet or when their digestive tract does not absorb enough. Vitamin B12 plays an important role in maintaining the health of your nervous system and red blood cells. This medicine may be used for other purposes; ask your health care provider or pharmacist if you have questions. COMMON BRAND NAME(S): B-12 Compliance Kit, B-12 Injection Kit, Cyomin, Dodex, LA-12, Nutri-Twelve, Physicians EZ Use B-12, Primabalt, Vitamin Deficiency Injectable System - B12 What should I tell my care team before I take this medication? They need to know if  you have any of these conditions: Kidney disease Leber's disease Megaloblastic anemia An unusual or allergic reaction to cyanocobalamin, cobalt, other medications, foods, dyes, or preservatives Pregnant or trying to get pregnant Breast-feeding How should I use this medication? This medication is injected into a muscle or deeply under the skin. It is usually given in a clinic or care team's office. However, your care team may teach you how to inject yourself. Follow all instructions. Talk to your care team about the use of this medication in children. Special care may be needed. Overdosage: If you think you have taken too much of this medicine contact a poison control center or emergency room at once. NOTE: This medicine is only for you. Do not share this medicine with others. What if I miss a dose? If you are given your dose at a clinic or care team's office, call to reschedule your appointment. If you give your own injections, and you miss a dose, take it as soon as you can. If it is almost time for your next dose, take only that dose. Do not take double or extra doses. What may interact with this medication? Alcohol Colchicine This list may not describe all possible interactions. Give your health care provider a list of all the medicines,  herbs, non-prescription drugs, or dietary supplements you use. Also tell them if you smoke, drink alcohol, or use illegal drugs. Some items may interact with your medicine. What should I watch for while using this medication? Visit your care team regularly. You may need blood work done while you are taking this medication. You may need to follow a special diet. Talk to your care team. Limit your alcohol intake and avoid smoking to get the best benefit. What side effects may I notice from receiving this medication? Side effects that you should report to your care team as soon as possible: Allergic reactions--skin rash, itching, hives, swelling of the face,  lips, tongue, or throat Swelling of the ankles, hands, or feet Trouble breathing Side effects that usually do not require medical attention (report to your care team if they continue or are bothersome): Diarrhea This list may not describe all possible side effects. Call your doctor for medical advice about side effects. You may report side effects to FDA at 1-800-FDA-1088. Where should I keep my medication? Keep out of the reach of children. Store at room temperature between 15 and 30 degrees C (59 and 85 degrees F). Protect from light. Throw away any unused medication after the expiration date. NOTE: This sheet is a summary. It may not cover all possible information. If you have questions about this medicine, talk to your doctor, pharmacist, or health care provider.  2024 Elsevier/Gold Standard (2020-08-30 00:00:00)

## 2022-10-22 DIAGNOSIS — M069 Rheumatoid arthritis, unspecified: Secondary | ICD-10-CM | POA: Diagnosis not present

## 2022-10-22 DIAGNOSIS — E538 Deficiency of other specified B group vitamins: Secondary | ICD-10-CM | POA: Diagnosis not present

## 2022-10-22 DIAGNOSIS — E559 Vitamin D deficiency, unspecified: Secondary | ICD-10-CM | POA: Diagnosis not present

## 2022-10-22 DIAGNOSIS — Z6825 Body mass index (BMI) 25.0-25.9, adult: Secondary | ICD-10-CM | POA: Diagnosis not present

## 2022-10-22 DIAGNOSIS — Z9181 History of falling: Secondary | ICD-10-CM | POA: Diagnosis not present

## 2022-10-22 DIAGNOSIS — E785 Hyperlipidemia, unspecified: Secondary | ICD-10-CM | POA: Diagnosis not present

## 2022-10-22 DIAGNOSIS — K2100 Gastro-esophageal reflux disease with esophagitis, without bleeding: Secondary | ICD-10-CM | POA: Diagnosis not present

## 2022-10-22 DIAGNOSIS — I4891 Unspecified atrial fibrillation: Secondary | ICD-10-CM | POA: Diagnosis not present

## 2022-10-22 DIAGNOSIS — Z139 Encounter for screening, unspecified: Secondary | ICD-10-CM | POA: Diagnosis not present

## 2022-10-22 DIAGNOSIS — E0590 Thyrotoxicosis, unspecified without thyrotoxic crisis or storm: Secondary | ICD-10-CM | POA: Diagnosis not present

## 2022-10-22 DIAGNOSIS — I50810 Right heart failure, unspecified: Secondary | ICD-10-CM | POA: Diagnosis not present

## 2022-10-22 DIAGNOSIS — I10 Essential (primary) hypertension: Secondary | ICD-10-CM | POA: Diagnosis not present

## 2022-10-22 DIAGNOSIS — J449 Chronic obstructive pulmonary disease, unspecified: Secondary | ICD-10-CM | POA: Diagnosis not present

## 2022-10-23 ENCOUNTER — Other Ambulatory Visit: Payer: Self-pay | Admitting: Oncology

## 2022-10-23 ENCOUNTER — Inpatient Hospital Stay: Payer: Medicare HMO | Attending: Oncology

## 2022-10-23 DIAGNOSIS — Z23 Encounter for immunization: Secondary | ICD-10-CM | POA: Insufficient documentation

## 2022-10-23 DIAGNOSIS — D509 Iron deficiency anemia, unspecified: Secondary | ICD-10-CM | POA: Insufficient documentation

## 2022-10-23 DIAGNOSIS — E538 Deficiency of other specified B group vitamins: Secondary | ICD-10-CM | POA: Insufficient documentation

## 2022-10-24 ENCOUNTER — Encounter: Payer: Self-pay | Admitting: Oncology

## 2022-10-25 ENCOUNTER — Inpatient Hospital Stay: Payer: Medicare HMO

## 2022-10-29 ENCOUNTER — Inpatient Hospital Stay: Payer: Medicare HMO

## 2022-10-29 DIAGNOSIS — D509 Iron deficiency anemia, unspecified: Secondary | ICD-10-CM | POA: Diagnosis not present

## 2022-10-29 DIAGNOSIS — D638 Anemia in other chronic diseases classified elsewhere: Secondary | ICD-10-CM

## 2022-10-29 DIAGNOSIS — Z23 Encounter for immunization: Secondary | ICD-10-CM | POA: Diagnosis not present

## 2022-10-29 DIAGNOSIS — E538 Deficiency of other specified B group vitamins: Secondary | ICD-10-CM | POA: Diagnosis not present

## 2022-10-29 LAB — CBC WITH DIFFERENTIAL (CANCER CENTER ONLY)
Abs Immature Granulocytes: 0.05 10*3/uL (ref 0.00–0.07)
Basophils Absolute: 0.1 10*3/uL (ref 0.0–0.1)
Basophils Relative: 1 %
Eosinophils Absolute: 0.5 10*3/uL (ref 0.0–0.5)
Eosinophils Relative: 5 %
HCT: 33.9 % — ABNORMAL LOW (ref 36.0–46.0)
Hemoglobin: 10.7 g/dL — ABNORMAL LOW (ref 12.0–15.0)
Immature Granulocytes: 1 %
Lymphocytes Relative: 17 %
Lymphs Abs: 1.5 10*3/uL (ref 0.7–4.0)
MCH: 28.2 pg (ref 26.0–34.0)
MCHC: 31.6 g/dL (ref 30.0–36.0)
MCV: 89.2 fL (ref 80.0–100.0)
Monocytes Absolute: 0.7 10*3/uL (ref 0.1–1.0)
Monocytes Relative: 9 %
Neutro Abs: 5.9 10*3/uL (ref 1.7–7.7)
Neutrophils Relative %: 67 %
Platelet Count: 333 10*3/uL (ref 150–400)
RBC: 3.8 MIL/uL — ABNORMAL LOW (ref 3.87–5.11)
RDW: 14.1 % (ref 11.5–15.5)
WBC Count: 8.7 10*3/uL (ref 4.0–10.5)
nRBC: 0 % (ref 0.0–0.2)

## 2022-10-29 LAB — IRON AND TIBC
Iron: 31 ug/dL (ref 28–170)
Saturation Ratios: 10 % — ABNORMAL LOW (ref 10.4–31.8)
TIBC: 300 ug/dL (ref 250–450)
UIBC: 269 ug/dL

## 2022-10-29 LAB — VITAMIN B12: Vitamin B-12: 614 pg/mL (ref 180–914)

## 2022-10-29 LAB — FOLATE: Folate: 16 ng/mL (ref >5.9)

## 2022-10-31 ENCOUNTER — Encounter: Payer: Self-pay | Admitting: Oncology

## 2022-11-01 ENCOUNTER — Inpatient Hospital Stay: Payer: Medicare HMO

## 2022-11-01 VITALS — BP 164/70 | HR 74 | Temp 98.4°F | Resp 20 | Ht 64.0 in | Wt 154.1 lb

## 2022-11-01 DIAGNOSIS — D509 Iron deficiency anemia, unspecified: Secondary | ICD-10-CM | POA: Diagnosis not present

## 2022-11-01 DIAGNOSIS — E538 Deficiency of other specified B group vitamins: Secondary | ICD-10-CM

## 2022-11-01 DIAGNOSIS — Z23 Encounter for immunization: Secondary | ICD-10-CM | POA: Diagnosis not present

## 2022-11-01 DIAGNOSIS — D638 Anemia in other chronic diseases classified elsewhere: Secondary | ICD-10-CM

## 2022-11-01 MED ORDER — INFLUENZA VAC A&B SURF ANT ADJ 0.5 ML IM SUSY
0.5000 mL | PREFILLED_SYRINGE | Freq: Once | INTRAMUSCULAR | Status: AC
  Administered 2022-11-01: 0.5 mL via INTRAMUSCULAR
  Filled 2022-11-01: qty 0.5

## 2022-11-01 MED ORDER — CYANOCOBALAMIN 1000 MCG/ML IJ SOLN
1000.0000 ug | Freq: Once | INTRAMUSCULAR | Status: AC
  Administered 2022-11-01: 1000 ug via INTRAMUSCULAR
  Filled 2022-11-01: qty 1

## 2022-11-01 NOTE — Patient Instructions (Signed)
Vitamin B12 Deficiency Vitamin B12 deficiency occurs when the body does not have enough of this important vitamin. The body needs this vitamin: To make red blood cells. To make DNA. This is the genetic material inside cells. To help the nerves work properly so they can carry messages from the brain to the body. Vitamin B12 deficiency can cause health problems, such as not having enough red blood cells in the blood (anemia). This can lead to nerve damage if untreated. What are the causes? This condition may be caused by: Not eating enough foods that contain vitamin B12. Not having enough stomach acid and digestive fluids to properly absorb vitamin B12 from the food that you eat. Having certain diseases that make it hard to absorb vitamin B12. These diseases include Crohn's disease, chronic pancreatitis, and cystic fibrosis. An autoimmune disorder in which the body does not make enough of a protein (intrinsic factor) within the stomach, resulting in not enough absorption of vitamin B12. Having a surgery in which part of the stomach or small intestine is removed. Taking certain medicines that make it hard for the body to absorb vitamin B12. These include: Heartburn medicines, such as antacids and proton pump inhibitors. Some medicines that are used to treat diabetes. What increases the risk? The following factors may make you more likely to develop a vitamin B12 deficiency: Being an older adult. Eating a vegetarian or vegan diet that does not include any foods that come from animals. Eating a poor diet while you are pregnant. Taking certain medicines. Having alcoholism. What are the signs or symptoms? In some cases, there are no symptoms of this condition. If the condition leads to anemia or nerve damage, various symptoms may occur, such as: Weakness. Tiredness (fatigue). Loss of appetite. Numbness or tingling in your hands and feet. Redness and burning of the tongue. Depression,  confusion, or memory problems. Trouble walking. If anemia is severe, symptoms can include: Shortness of breath. Dizziness. Rapid heart rate. How is this diagnosed? This condition may be diagnosed with a blood test to measure the level of vitamin B12 in your blood. You may also have other tests, including: A group of tests that measure certain characteristics of blood cells (complete blood count, CBC). A blood test to measure intrinsic factor. A procedure where a thin tube with a camera on the end is used to look into your stomach or intestines (endoscopy). Other tests may be needed to discover the cause of the deficiency. How is this treated? Treatment for this condition depends on the cause. This condition may be treated by: Changing your eating and drinking habits, such as: Eating more foods that contain vitamin B12. Drinking less alcohol or no alcohol. Getting vitamin B12 injections. Taking vitamin B12 supplements by mouth (orally). Your health care provider will tell you which dose is best for you. Follow these instructions at home: Eating and drinking  Include foods in your diet that come from animals and contain a lot of vitamin B12. These include: Meats and poultry. This includes beef, pork, chicken, [place], and organ meats, such as liver. Seafood. This includes clams, rainbow trout, salmon, tuna, and haddock. Eggs. Dairy foods such as milk, yogurt, and cheese. Eat foods that have vitamin B12 added to them (are fortified), such as ready-to-eat breakfast cereals. Check the label on the package to see if a food is fortified. The items listed above may not be a complete list of foods and beverages you can eat and drink. Contact a dietitian for  more information. Alcohol use Do not drink alcohol if: Your health care provider tells you not to drink. You are pregnant, may be pregnant, or are planning to become pregnant. If you drink alcohol: Limit how much you have to: 0-1 drink a  day for women. 0-2 drinks a day for men. Know how much alcohol is in your drink. In the [place] one drink equals one 12 oz bottle of beer (355 mL), one 5 oz glass of wine (148 mL), or one 1 oz glass of hard liquor (44 mL). General instructions Get vitamin B12 injections if told to by your health care provider. Take supplements only as told by your health care provider. Follow the directions carefully. Keep all follow-up visits. This is important. Contact a health care provider if: Your symptoms come back. Your symptoms get worse or do not improve with treatment. Get help right away: You develop shortness of breath. You have a rapid heart rate. You have chest pain. You become dizzy or you faint. These symptoms may be an emergency. Get help right away. Call 911. Do not wait to see if the symptoms will go away. Do not drive yourself to the hospital. Summary Vitamin B12 deficiency occurs when the body does not have enough of this important vitamin. Common causes include not eating enough foods that contain vitamin B12, not being able to absorb vitamin B12 from the food that you eat, having a surgery in which part of the stomach or small intestine is removed, or taking certain medicines. Eat foods that have vitamin B12 in them. Treatment may include making a change in the way you eat and drink, getting vitamin B12 injections, or taking vitamin B12 supplements. This information is not intended to replace advice given to you by your health care provider. Make sure you discuss any questions you have with your health care provider. Document Revised: 08/12/2020 Document Reviewed: 08/12/2020 Elsevier Patient Education  2024 [employer] Influenza Vaccine Injection What is this medication? INFLUENZA VACCINE (in floo EN zuh vak SEEN) reduces the risk of the influenza (flu). It does not treat influenza. It is still possible to get influenza after receiving this vaccine, but the symptoms may be less  severe or not last as long. It works by helping your immune system learn how to fight off a future infection. This medicine may be used for other purposes; ask your health care provider or pharmacist if you have questions. COMMON BRAND NAME(S): Afluria Quadrivalent, FLUAD Quadrivalent, Fluarix Quadrivalent, Flublok Quadrivalent, FLUCELVAX Quadrivalent, Flulaval Quadrivalent, Fluzone Quadrivalent What should I tell my care team before I take this medication? They need to know if you have any of these conditions: Bleeding disorder like hemophilia Fever or infection Guillain-Barre syndrome or other neurological problems Immune system problems Infection with the human immunodeficiency virus (HIV) or AIDS Low blood platelet counts Multiple sclerosis An unusual or allergic reaction to influenza virus vaccine, latex, other medications, foods, dyes, or preservatives. Different brands of vaccines contain different allergens. Some may contain latex or eggs. Talk to your care team about your allergies to make sure that you get the right vaccine. Pregnant or trying to get pregnant Breastfeeding How should I use this medication? This vaccine is injected into a muscle or under the skin. It is given by your care team. A copy of Vaccine Information Statements will be given before each vaccination. Be sure to read this sheet carefully each time. This sheet may change often. Talk to your care team to see which  vaccines are right for you. Some vaccines should not be used in all age groups. Overdosage: If you think you have taken too much of this medicine contact a poison control center or emergency room at once. NOTE: This medicine is only for you. Do not share this medicine with others. What if I miss a dose? This does not apply. What may interact with this medication? Certain medications that lower your immune system, such as etanercept, anakinra, infliximab, adalimumab Certain medications that prevent or  treat blood clots, such as warfarin Chemotherapy or radiation therapy Phenytoin Steroid medications, such as prednisone or cortisone Theophylline Vaccines This list may not describe all possible interactions. Give your health care provider a list of all the medicines, herbs, non-prescription drugs, or dietary supplements you use. Also tell them if you smoke, drink alcohol, or use illegal drugs. Some items may interact with your medicine. What should I watch for while using this medication? Report any side effects that do not go away with your care team. Call your care team if any unusual symptoms occur within 6 weeks of receiving this vaccine. You may still catch the flu, but the illness is not usually as bad. You cannot get the flu from the vaccine. The vaccine will not protect against colds or other illnesses that may cause fever. The vaccine is needed every year. What side effects may I notice from receiving this medication? Side effects that you should report to your care team as soon as possible: Allergic reactions--skin rash, itching, hives, swelling of the face, lips, tongue, or throat Side effects that usually do not require medical attention (report these to your care team if they continue or are bothersome): Chills Fatigue Headache Joint pain Loss of appetite Muscle pain Nausea Pain, redness, or irritation at injection site This list may not describe all possible side effects. Call your doctor for medical advice about side effects. You may report side effects to FDA at 1-800-FDA-1088. Where should I keep my medication? The vaccine is only given by your care team. It will not be stored at home. NOTE: This sheet is a summary. It may not cover all possible information. If you have questions about this medicine, talk to your doctor, pharmacist, or health care provider.  2024 Elsevier/Gold Standard (2021-05-30 00:00:00)

## 2022-11-20 ENCOUNTER — Inpatient Hospital Stay: Payer: Medicare HMO | Attending: Oncology

## 2022-11-20 DIAGNOSIS — E538 Deficiency of other specified B group vitamins: Secondary | ICD-10-CM | POA: Diagnosis not present

## 2022-11-20 DIAGNOSIS — D509 Iron deficiency anemia, unspecified: Secondary | ICD-10-CM | POA: Insufficient documentation

## 2022-11-20 LAB — CBC WITH DIFFERENTIAL (CANCER CENTER ONLY)
Abs Immature Granulocytes: 0.1 10*3/uL — ABNORMAL HIGH (ref 0.00–0.07)
Basophils Absolute: 0.1 10*3/uL (ref 0.0–0.1)
Basophils Relative: 1 %
Eosinophils Absolute: 0.8 10*3/uL — ABNORMAL HIGH (ref 0.0–0.5)
Eosinophils Relative: 8 %
HCT: 31.1 % — ABNORMAL LOW (ref 36.0–46.0)
Hemoglobin: 10.1 g/dL — ABNORMAL LOW (ref 12.0–15.0)
Immature Granulocytes: 1 %
Lymphocytes Relative: 22 %
Lymphs Abs: 2 10*3/uL (ref 0.7–4.0)
MCH: 27.7 pg (ref 26.0–34.0)
MCHC: 32.5 g/dL (ref 30.0–36.0)
MCV: 85.4 fL (ref 80.0–100.0)
Monocytes Absolute: 1.1 10*3/uL — ABNORMAL HIGH (ref 0.1–1.0)
Monocytes Relative: 12 %
Neutro Abs: 5 10*3/uL (ref 1.7–7.7)
Neutrophils Relative %: 56 %
Platelet Count: 327 10*3/uL (ref 150–400)
RBC: 3.64 MIL/uL — ABNORMAL LOW (ref 3.87–5.11)
RDW: 14.2 % (ref 11.5–15.5)
WBC Count: 9 10*3/uL (ref 4.0–10.5)
nRBC: 0 % (ref 0.0–0.2)
nRBC: 0 /100{WBCs}

## 2022-11-21 ENCOUNTER — Telehealth: Payer: Self-pay | Admitting: Oncology

## 2022-11-21 NOTE — Telephone Encounter (Signed)
11/21/22 Left msg that injection not needed on 11/22/22.

## 2022-11-22 ENCOUNTER — Inpatient Hospital Stay: Payer: Medicare HMO

## 2022-12-03 DIAGNOSIS — Z139 Encounter for screening, unspecified: Secondary | ICD-10-CM | POA: Diagnosis not present

## 2022-12-03 DIAGNOSIS — Z9181 History of falling: Secondary | ICD-10-CM | POA: Diagnosis not present

## 2022-12-03 DIAGNOSIS — Z0000 Encounter for general adult medical examination without abnormal findings: Secondary | ICD-10-CM | POA: Diagnosis not present

## 2022-12-05 NOTE — Progress Notes (Signed)
[facility]  [address] [place],  [place]  [address] [phone]  Clinic Day:  12/06/2022  Referring physician: Jim Like, NP  HISTORY OF PRESENT ILLNESS:  The patient is an 82 y.o. female with a history of anemia secondary to chronic disease.   There is a history of her being iron deficient for which she received IV iron in December 2023.  She also has a history of vitamin B12 deficiency, for which she receives monthly B12 injections to maintain her cobalamin stores.  She comes in today to reassess her hemoglobin.  Since her last visit, the patient claims to be doing fairly well.  She denies having increased fatigue or any overt forms of blood loss which concern her for progressive anemia.  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  PHYSICAL EXAM:  Blood pressure (!) 141/59, pulse (!) 52, temperature 98.1 F (36.7 C), resp. rate 14, height 5\' 4"  (1.626 m), weight 154 lb (69.9 kg), SpO2 100%. Wt Readings from Last 3 Encounters:  12/06/22 154 lb (69.9 kg)  11/01/22 154 lb 1.9 oz (69.9 kg)  09/27/22 156 lb 0.6 oz (70.8 kg)   Body mass index is 26.43 kg/m. Performance status (ECOG): 1 Physical Exam Constitutional:      Appearance: Normal appearance. She is not ill-appearing.  HENT:     Mouth/Throat:     Mouth: Mucous membranes are moist.     Pharynx: Oropharynx is clear. No oropharyngeal exudate or posterior oropharyngeal erythema.  Cardiovascular:     Rate and Rhythm: Normal rate and regular rhythm.     Heart sounds: No murmur heard.    No friction rub. No gallop.  Pulmonary:     Effort: Pulmonary effort is normal. No respiratory distress.     Breath sounds: Normal breath sounds. No wheezing, rhonchi or rales.  Abdominal:     General: Bowel sounds are normal. There is no distension.     Palpations: Abdomen is soft. There is no mass.     Tenderness: There is no abdominal tenderness.   Musculoskeletal:        General: No swelling.     Right lower leg: No edema.     Left lower leg: No edema.  Lymphadenopathy:     Cervical: No cervical adenopathy.     Upper Body:     Right upper body: No supraclavicular or axillary adenopathy.     Left upper body: No supraclavicular or axillary adenopathy.     Lower Body: No right inguinal adenopathy. No left inguinal adenopathy.  Skin:    General: Skin is warm.     Coloration: Skin is not jaundiced.     Findings: No lesion or rash.  Neurological:     General: No focal deficit present.     Mental Status: She is alert and oriented to person, place, and time. Mental status is at baseline.  Psychiatric:        Mood and Affect: Mood normal.        Behavior: Behavior normal.        Thought Content: Thought content normal.    LABS:      Latest Ref Rng & Units 12/06/2022   10:08 AM 11/20/2022   10:28 AM 10/29/2022   11:15 AM  CBC  WBC 4.0 - 10.5 K/uL 7.6  9.0  8.7   Hemoglobin 12.0 - 15.0 g/dL 25.4  27.0  62.3   Hematocrit 36.0 - 46.0 %  32.8  31.1  33.9   Platelets 150 - 400 K/uL 352  327  333       Latest Ref Rng & Units 12/06/2022   10:08 AM 07/31/2022   10:10 AM 03/22/2022   11:06 AM  CMP  Glucose 70 - 99 mg/dL 595  88  96   BUN 8 - 23 mg/dL 18  14  12    Creatinine 0.44 - 1.00 mg/dL 6.38  7.56  4.33   Sodium 135 - 145 mmol/L 137  137  136   Potassium 3.5 - 5.1 mmol/L 4.1  3.4  3.5   Chloride 98 - 111 mmol/L 98  100  101   CO2 22 - 32 mmol/L 27  29  28    Calcium 8.9 - 10.3 mg/dL 9.7  9.1  8.9   Total Protein 6.5 - 8.1 g/dL 7.9  7.5  7.9   Total Bilirubin <1.2 mg/dL 0.5  0.4  0.8   Alkaline Phos 38 - 126 U/L 132  99  93   AST 15 - 41 U/L 18  15  14    ALT 0 - 44 U/L 5  9  9      Latest Reference Range & Units 12/06/22 10:08 12/06/22 10:09  Iron 28 - 170 ug/dL  65  UIBC ug/dL  295  TIBC 188 - 416 ug/dL  606  Saturation Ratios 10.4 - 31.8 %  22  Ferritin 11 - 307 ng/mL  187  Folate >5.9 ng/mL  9.9  Vitamin B12 180  - 914 pg/mL 519    ASSEiSSMENT & PLAN:  Assessment/Plan:  An 82 y.o. female with anemia of chronic disease, as well as a history of iron and B12 deficiency.  Her hemoglobin of 10.6 remains adequate.  Clinically, she is doing well.  She will continue receiving her monthly B12 injections for her history of vitamin B12 deficiency.   Otherwise, as she is doing well, I will see her back in 4 months to reassess her anemia.  The patient understands all the plans discussed today and is in agreement with them.   Anahy Esh Kirby Funk, MD

## 2022-12-06 ENCOUNTER — Inpatient Hospital Stay: Payer: Medicare HMO | Attending: Oncology | Admitting: Oncology

## 2022-12-06 ENCOUNTER — Inpatient Hospital Stay: Payer: Medicare HMO

## 2022-12-06 ENCOUNTER — Other Ambulatory Visit: Payer: Self-pay | Admitting: Oncology

## 2022-12-06 VITALS — BP 141/59 | HR 52 | Temp 98.1°F | Resp 14 | Ht 64.0 in | Wt 154.0 lb

## 2022-12-06 DIAGNOSIS — D638 Anemia in other chronic diseases classified elsewhere: Secondary | ICD-10-CM

## 2022-12-06 DIAGNOSIS — E611 Iron deficiency: Secondary | ICD-10-CM | POA: Insufficient documentation

## 2022-12-06 DIAGNOSIS — M069 Rheumatoid arthritis, unspecified: Secondary | ICD-10-CM | POA: Insufficient documentation

## 2022-12-06 DIAGNOSIS — D649 Anemia, unspecified: Secondary | ICD-10-CM

## 2022-12-06 DIAGNOSIS — E538 Deficiency of other specified B group vitamins: Secondary | ICD-10-CM | POA: Diagnosis not present

## 2022-12-06 LAB — IRON AND TIBC
Iron: 65 ug/dL (ref 28–170)
Saturation Ratios: 22 % (ref 10.4–31.8)
TIBC: 298 ug/dL (ref 250–450)
UIBC: 233 ug/dL

## 2022-12-06 LAB — CBC WITH DIFFERENTIAL (CANCER CENTER ONLY)
Abs Immature Granulocytes: 0.05 10*3/uL (ref 0.00–0.07)
Basophils Absolute: 0.1 10*3/uL (ref 0.0–0.1)
Basophils Relative: 1 %
Eosinophils Absolute: 0.6 10*3/uL — ABNORMAL HIGH (ref 0.0–0.5)
Eosinophils Relative: 8 %
HCT: 32.8 % — ABNORMAL LOW (ref 36.0–46.0)
Hemoglobin: 10.6 g/dL — ABNORMAL LOW (ref 12.0–15.0)
Immature Granulocytes: 1 %
Lymphocytes Relative: 20 %
Lymphs Abs: 1.5 10*3/uL (ref 0.7–4.0)
MCH: 27.9 pg (ref 26.0–34.0)
MCHC: 32.3 g/dL (ref 30.0–36.0)
MCV: 86.3 fL (ref 80.0–100.0)
Monocytes Absolute: 0.6 10*3/uL (ref 0.1–1.0)
Monocytes Relative: 8 %
Neutro Abs: 4.8 10*3/uL (ref 1.7–7.7)
Neutrophils Relative %: 62 %
Platelet Count: 352 10*3/uL (ref 150–400)
RBC: 3.8 MIL/uL — ABNORMAL LOW (ref 3.87–5.11)
RDW: 14.8 % (ref 11.5–15.5)
WBC Count: 7.6 10*3/uL (ref 4.0–10.5)
nRBC: 0 % (ref 0.0–0.2)
nRBC: 0 /100{WBCs}

## 2022-12-06 LAB — CMP (CANCER CENTER ONLY)
ALT: <5 U/L (ref 0–44)
AST: 18 U/L (ref 15–41)
Albumin: 4 g/dL (ref 3.5–5.0)
Alkaline Phosphatase: 132 U/L — ABNORMAL HIGH (ref 38–126)
Anion gap: 12 (ref 5–15)
BUN: 18 mg/dL (ref 8–23)
CO2: 27 mmol/L (ref 22–32)
Calcium: 9.7 mg/dL (ref 8.9–10.3)
Chloride: 98 mmol/L (ref 98–111)
Creatinine: 1 mg/dL (ref 0.44–1.00)
GFR, Estimated: 56 mL/min — ABNORMAL LOW (ref >60)
Glucose, Bld: 102 mg/dL — ABNORMAL HIGH (ref 70–99)
Potassium: 4.1 mmol/L (ref 3.5–5.1)
Sodium: 137 mmol/L (ref 135–145)
Total Bilirubin: 0.5 mg/dL (ref <1.2)
Total Protein: 7.9 g/dL (ref 6.5–8.1)

## 2022-12-06 LAB — FERRITIN: Ferritin: 187 ng/mL (ref 11–307)

## 2022-12-06 LAB — VITAMIN B12: Vitamin B-12: 519 pg/mL (ref 180–914)

## 2022-12-06 LAB — FOLATE: Folate: 9.9 ng/mL (ref >5.9)

## 2022-12-09 ENCOUNTER — Encounter: Payer: Self-pay | Admitting: Oncology

## 2022-12-18 ENCOUNTER — Other Ambulatory Visit: Payer: Medicare HMO

## 2022-12-20 ENCOUNTER — Ambulatory Visit: Payer: Medicare HMO

## 2022-12-27 DIAGNOSIS — M0579 Rheumatoid arthritis with rheumatoid factor of multiple sites without organ or systems involvement: Secondary | ICD-10-CM | POA: Diagnosis not present

## 2022-12-27 DIAGNOSIS — Z6824 Body mass index (BMI) 24.0-24.9, adult: Secondary | ICD-10-CM | POA: Diagnosis not present

## 2022-12-27 DIAGNOSIS — M6281 Muscle weakness (generalized): Secondary | ICD-10-CM | POA: Diagnosis not present

## 2022-12-27 DIAGNOSIS — R296 Repeated falls: Secondary | ICD-10-CM | POA: Diagnosis not present

## 2022-12-27 DIAGNOSIS — M1991 Primary osteoarthritis, unspecified site: Secondary | ICD-10-CM | POA: Diagnosis not present

## 2023-03-29 DIAGNOSIS — W19XXXA Unspecified fall, initial encounter: Secondary | ICD-10-CM | POA: Diagnosis not present

## 2023-03-29 DIAGNOSIS — S4981XA Other specified injuries of right shoulder and upper arm, initial encounter: Secondary | ICD-10-CM | POA: Diagnosis not present

## 2023-03-29 DIAGNOSIS — Z743 Need for continuous supervision: Secondary | ICD-10-CM | POA: Diagnosis not present

## 2023-03-29 DIAGNOSIS — M25511 Pain in right shoulder: Secondary | ICD-10-CM | POA: Diagnosis not present

## 2023-04-01 ENCOUNTER — Encounter: Payer: Self-pay | Admitting: Oncology

## 2023-04-03 DIAGNOSIS — R296 Repeated falls: Secondary | ICD-10-CM | POA: Diagnosis not present

## 2023-04-03 DIAGNOSIS — M0579 Rheumatoid arthritis with rheumatoid factor of multiple sites without organ or systems involvement: Secondary | ICD-10-CM | POA: Diagnosis not present

## 2023-04-03 DIAGNOSIS — M1991 Primary osteoarthritis, unspecified site: Secondary | ICD-10-CM | POA: Diagnosis not present

## 2023-04-04 DIAGNOSIS — R531 Weakness: Secondary | ICD-10-CM | POA: Diagnosis not present

## 2023-04-04 DIAGNOSIS — I509 Heart failure, unspecified: Secondary | ICD-10-CM | POA: Diagnosis not present

## 2023-04-04 DIAGNOSIS — D509 Iron deficiency anemia, unspecified: Secondary | ICD-10-CM | POA: Diagnosis not present

## 2023-04-04 DIAGNOSIS — E876 Hypokalemia: Secondary | ICD-10-CM | POA: Diagnosis not present

## 2023-04-04 DIAGNOSIS — I110 Hypertensive heart disease with heart failure: Secondary | ICD-10-CM | POA: Diagnosis not present

## 2023-04-04 DIAGNOSIS — R9431 Abnormal electrocardiogram [ECG] [EKG]: Secondary | ICD-10-CM | POA: Diagnosis not present

## 2023-04-07 NOTE — Progress Notes (Deleted)
 [facility]  [address] [place],  [place]  [address] [phone]  Clinic Day:  12/06/2022  Referring physician: Jim Like, NP  HISTORY OF PRESENT ILLNESS:  The patient is an 83 y.o. female with a history of anemia secondary to chronic disease.   There is a history of her being iron deficient for which she received IV iron in December 2023.  She also has a history of vitamin B12 deficiency, for which she receives monthly B12 injections to maintain her cobalamin stores.  She comes in today to reassess her hemoglobin.  Since her last visit, the patient claims to be doing fairly well.  She denies having increased fatigue or any overt forms of blood loss which concern her for progressive anemia.  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  PHYSICAL EXAM:  There were no vitals taken for this visit. Wt Readings from Last 3 Encounters:  12/06/22 154 lb (69.9 kg)  11/01/22 154 lb 1.9 oz (69.9 kg)  09/27/22 156 lb 0.6 oz (70.8 kg)   There is no height or weight on file to calculate BMI. Performance status (ECOG): 1 Physical Exam Constitutional:      Appearance: Normal appearance. She is not ill-appearing.  HENT:     Mouth/Throat:     Mouth: Mucous membranes are moist.     Pharynx: Oropharynx is clear. No oropharyngeal exudate or posterior oropharyngeal erythema.  Cardiovascular:     Rate and Rhythm: Normal rate and regular rhythm.     Heart sounds: No murmur heard.    No friction rub. No gallop.  Pulmonary:     Effort: Pulmonary effort is normal. No respiratory distress.     Breath sounds: Normal breath sounds. No wheezing, rhonchi or rales.  Abdominal:     General: Bowel sounds are normal. There is no distension.     Palpations: Abdomen is soft. There is no mass.     Tenderness: There is no abdominal tenderness.  Musculoskeletal:        General: No swelling.     Right lower leg: No edema.     Left lower  leg: No edema.  Lymphadenopathy:     Cervical: No cervical adenopathy.     Upper Body:     Right upper body: No supraclavicular or axillary adenopathy.     Left upper body: No supraclavicular or axillary adenopathy.     Lower Body: No right inguinal adenopathy. No left inguinal adenopathy.  Skin:    General: Skin is warm.     Coloration: Skin is not jaundiced.     Findings: No lesion or rash.  Neurological:     General: No focal deficit present.     Mental Status: She is alert and oriented to person, place, and time. Mental status is at baseline.  Psychiatric:        Mood and Affect: Mood normal.        Behavior: Behavior normal.        Thought Content: Thought content normal.    LABS:      Latest Ref Rng & Units 12/06/2022   10:08 AM 11/20/2022   10:28 AM 10/29/2022   11:15 AM  CBC  WBC 4.0 - 10.5 K/uL 7.6  9.0  8.7   Hemoglobin 12.0 - 15.0 g/dL 21.3  08.6  57.8   Hematocrit 36.0 - 46.0 % 32.8  31.1  33.9   Platelets 150 - 400 K/uL 352  327  333       Latest Ref Rng & Units 12/06/2022   10:08 AM 07/31/2022   10:10 AM 03/22/2022   11:06 AM  CMP  Glucose 70 - 99 mg/dL 130  88  96   BUN 8 - 23 mg/dL 18  14  12    Creatinine 0.44 - 1.00 mg/dL 8.65  7.84  6.96   Sodium 135 - 145 mmol/L 137  137  136   Potassium 3.5 - 5.1 mmol/L 4.1  3.4  3.5   Chloride 98 - 111 mmol/L 98  100  101   CO2 22 - 32 mmol/L 27  29  28    Calcium 8.9 - 10.3 mg/dL 9.7  9.1  8.9   Total Protein 6.5 - 8.1 g/dL 7.9  7.5  7.9   Total Bilirubin <1.2 mg/dL 0.5  0.4  0.8   Alkaline Phos 38 - 126 U/L 132  99  93   AST 15 - 41 U/L 18  15  14    ALT 0 - 44 U/L 5  9  9      Latest Reference Range & Units 12/06/22 10:08 12/06/22 10:09  Iron 28 - 170 ug/dL  65  UIBC ug/dL  295  TIBC 284 - 132 ug/dL  440  Saturation Ratios 10.4 - 31.8 %  22  Ferritin 11 - 307 ng/mL  187  Folate >5.9 ng/mL  9.9  Vitamin B12 180 - 914 pg/mL 519    ASSEiSSMENT & PLAN:  Assessment/Plan:  An 83 y.o. female with anemia of  chronic disease, as well as a history of iron and B12 deficiency.  Her hemoglobin of 10.6 remains adequate.  Clinically, she is doing well.  She will continue receiving her monthly B12 injections for her history of vitamin B12 deficiency.   Otherwise, as she is doing well, I will see her back in 4 months to reassess her anemia.  The patient understands all the plans discussed today and is in agreement with them.   Mariea Mcmartin Kirby Funk, MD

## 2023-04-08 ENCOUNTER — Inpatient Hospital Stay: Payer: Medicare HMO

## 2023-04-08 ENCOUNTER — Inpatient Hospital Stay: Payer: Medicare HMO | Attending: Oncology | Admitting: Oncology

## 2023-04-08 DIAGNOSIS — E538 Deficiency of other specified B group vitamins: Secondary | ICD-10-CM | POA: Insufficient documentation

## 2023-04-08 DIAGNOSIS — E611 Iron deficiency: Secondary | ICD-10-CM | POA: Insufficient documentation

## 2023-04-08 DIAGNOSIS — D638 Anemia in other chronic diseases classified elsewhere: Secondary | ICD-10-CM | POA: Insufficient documentation

## 2023-04-08 DIAGNOSIS — M069 Rheumatoid arthritis, unspecified: Secondary | ICD-10-CM | POA: Insufficient documentation

## 2023-04-08 DIAGNOSIS — Z79631 Long term (current) use of antimetabolite agent: Secondary | ICD-10-CM | POA: Insufficient documentation

## 2023-04-21 NOTE — Progress Notes (Unsigned)
 [facility]  [address] [place],  [place]  [address] [phone]  Clinic Day:  12/06/2022  Referring physician: Jim Like, NP  HISTORY OF PRESENT ILLNESS:  The patient is an 83 y.o. female with a history of anemia secondary to chronic disease.   There is a history of her being iron deficient for which she received IV iron in December 2023.  She also has a history of vitamin B12 deficiency, for which she receives monthly B12 injections to maintain her cobalamin stores.  She comes in today to reassess her hemoglobin.  Since her last visit, the patient claims to be doing fairly well.  She denies having increased fatigue or any overt forms of blood loss which concern her for progressive anemia.  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  PHYSICAL EXAM:  There were no vitals taken for this visit. Wt Readings from Last 3 Encounters:  12/06/22 154 lb (69.9 kg)  11/01/22 154 lb 1.9 oz (69.9 kg)  09/27/22 156 lb 0.6 oz (70.8 kg)   There is no height or weight on file to calculate BMI. Performance status (ECOG): 1 Physical Exam Constitutional:      Appearance: Normal appearance. She is not ill-appearing.  HENT:     Mouth/Throat:     Mouth: Mucous membranes are moist.     Pharynx: Oropharynx is clear. No oropharyngeal exudate or posterior oropharyngeal erythema.  Cardiovascular:     Rate and Rhythm: Normal rate and regular rhythm.     Heart sounds: No murmur heard.    No friction rub. No gallop.  Pulmonary:     Effort: Pulmonary effort is normal. No respiratory distress.     Breath sounds: Normal breath sounds. No wheezing, rhonchi or rales.  Abdominal:     General: Bowel sounds are normal. There is no distension.     Palpations: Abdomen is soft. There is no mass.     Tenderness: There is no abdominal tenderness.  Musculoskeletal:        General: No swelling.     Right lower leg: No edema.     Left lower  leg: No edema.  Lymphadenopathy:     Cervical: No cervical adenopathy.     Upper Body:     Right upper body: No supraclavicular or axillary adenopathy.     Left upper body: No supraclavicular or axillary adenopathy.     Lower Body: No right inguinal adenopathy. No left inguinal adenopathy.  Skin:    General: Skin is warm.     Coloration: Skin is not jaundiced.     Findings: No lesion or rash.  Neurological:     General: No focal deficit present.     Mental Status: She is alert and oriented to person, place, and time. Mental status is at baseline.  Psychiatric:        Mood and Affect: Mood normal.        Behavior: Behavior normal.        Thought Content: Thought content normal.    LABS:      Latest Ref Rng & Units 12/06/2022   10:08 AM 11/20/2022   10:28 AM 10/29/2022   11:15 AM  CBC  WBC 4.0 - 10.5 K/uL 7.6  9.0  8.7   Hemoglobin 12.0 - 15.0 g/dL 21.3  08.6  57.8   Hematocrit 36.0 - 46.0 % 32.8  31.1  33.9   Platelets 150 - 400 K/uL 352  327  333       Latest Ref Rng & Units 12/06/2022   10:08 AM 07/31/2022   10:10 AM 03/22/2022   11:06 AM  CMP  Glucose 70 - 99 mg/dL 130  88  96   BUN 8 - 23 mg/dL 18  14  12    Creatinine 0.44 - 1.00 mg/dL 8.65  7.84  6.96   Sodium 135 - 145 mmol/L 137  137  136   Potassium 3.5 - 5.1 mmol/L 4.1  3.4  3.5   Chloride 98 - 111 mmol/L 98  100  101   CO2 22 - 32 mmol/L 27  29  28    Calcium 8.9 - 10.3 mg/dL 9.7  9.1  8.9   Total Protein 6.5 - 8.1 g/dL 7.9  7.5  7.9   Total Bilirubin <1.2 mg/dL 0.5  0.4  0.8   Alkaline Phos 38 - 126 U/L 132  99  93   AST 15 - 41 U/L 18  15  14    ALT 0 - 44 U/L 5  9  9      Latest Reference Range & Units 12/06/22 10:08 12/06/22 10:09  Iron 28 - 170 ug/dL  65  UIBC ug/dL  295  TIBC 284 - 132 ug/dL  440  Saturation Ratios 10.4 - 31.8 %  22  Ferritin 11 - 307 ng/mL  187  Folate >5.9 ng/mL  9.9  Vitamin B12 180 - 914 pg/mL 519    ASSEiSSMENT & PLAN:  Assessment/Plan:  An 83 y.o. female with anemia of  chronic disease, as well as a history of iron and B12 deficiency.  Her hemoglobin of 10.6 remains adequate.  Clinically, she is doing well.  She will continue receiving her monthly B12 injections for her history of vitamin B12 deficiency.   Otherwise, as she is doing well, I will see her back in 4 months to reassess her anemia.  The patient understands all the plans discussed today and is in agreement with them.   Mariea Mcmartin Kirby Funk, MD

## 2023-04-22 ENCOUNTER — Other Ambulatory Visit: Payer: Self-pay | Admitting: Oncology

## 2023-04-22 ENCOUNTER — Other Ambulatory Visit: Payer: Self-pay

## 2023-04-22 ENCOUNTER — Inpatient Hospital Stay: Admitting: Oncology

## 2023-04-22 ENCOUNTER — Inpatient Hospital Stay

## 2023-04-22 VITALS — BP 89/55 | HR 72 | Resp 18 | Ht 64.0 in | Wt 128.0 lb

## 2023-04-22 DIAGNOSIS — E611 Iron deficiency: Secondary | ICD-10-CM | POA: Diagnosis not present

## 2023-04-22 DIAGNOSIS — E0590 Thyrotoxicosis, unspecified without thyrotoxic crisis or storm: Secondary | ICD-10-CM | POA: Diagnosis not present

## 2023-04-22 DIAGNOSIS — I50810 Right heart failure, unspecified: Secondary | ICD-10-CM | POA: Diagnosis not present

## 2023-04-22 DIAGNOSIS — Z79631 Long term (current) use of antimetabolite agent: Secondary | ICD-10-CM | POA: Diagnosis not present

## 2023-04-22 DIAGNOSIS — D638 Anemia in other chronic diseases classified elsewhere: Secondary | ICD-10-CM | POA: Diagnosis not present

## 2023-04-22 DIAGNOSIS — J449 Chronic obstructive pulmonary disease, unspecified: Secondary | ICD-10-CM | POA: Diagnosis not present

## 2023-04-22 DIAGNOSIS — D509 Iron deficiency anemia, unspecified: Secondary | ICD-10-CM | POA: Diagnosis not present

## 2023-04-22 DIAGNOSIS — E785 Hyperlipidemia, unspecified: Secondary | ICD-10-CM | POA: Diagnosis not present

## 2023-04-22 DIAGNOSIS — K2100 Gastro-esophageal reflux disease with esophagitis, without bleeding: Secondary | ICD-10-CM | POA: Diagnosis not present

## 2023-04-22 DIAGNOSIS — I10 Essential (primary) hypertension: Secondary | ICD-10-CM | POA: Diagnosis not present

## 2023-04-22 DIAGNOSIS — Z6821 Body mass index (BMI) 21.0-21.9, adult: Secondary | ICD-10-CM | POA: Diagnosis not present

## 2023-04-22 DIAGNOSIS — M069 Rheumatoid arthritis, unspecified: Secondary | ICD-10-CM | POA: Diagnosis not present

## 2023-04-22 DIAGNOSIS — E538 Deficiency of other specified B group vitamins: Secondary | ICD-10-CM | POA: Diagnosis not present

## 2023-04-22 DIAGNOSIS — I4891 Unspecified atrial fibrillation: Secondary | ICD-10-CM | POA: Diagnosis not present

## 2023-04-22 DIAGNOSIS — E559 Vitamin D deficiency, unspecified: Secondary | ICD-10-CM | POA: Diagnosis not present

## 2023-04-22 DIAGNOSIS — D649 Anemia, unspecified: Secondary | ICD-10-CM

## 2023-04-22 LAB — ABO/RH: ABO/RH(D): O POS

## 2023-04-22 LAB — CBC WITH DIFFERENTIAL (CANCER CENTER ONLY)
Abs Immature Granulocytes: 0.35 10*3/uL — ABNORMAL HIGH (ref 0.00–0.07)
Basophils Absolute: 0 10*3/uL (ref 0.0–0.1)
Basophils Relative: 0 %
Eosinophils Absolute: 0 10*3/uL (ref 0.0–0.5)
Eosinophils Relative: 0 %
HCT: 17.4 % — ABNORMAL LOW (ref 36.0–46.0)
Hemoglobin: 6 g/dL — CL (ref 12.0–15.0)
Immature Granulocytes: 3 %
Lymphocytes Relative: 4 %
Lymphs Abs: 0.5 10*3/uL — ABNORMAL LOW (ref 0.7–4.0)
MCH: 29.6 pg (ref 26.0–34.0)
MCHC: 34.5 g/dL (ref 30.0–36.0)
MCV: 85.7 fL (ref 80.0–100.0)
Monocytes Absolute: 0 10*3/uL — ABNORMAL LOW (ref 0.1–1.0)
Monocytes Relative: 0 %
Neutro Abs: 11.2 10*3/uL — ABNORMAL HIGH (ref 1.7–7.7)
Neutrophils Relative %: 93 %
Platelet Count: 102 10*3/uL — ABNORMAL LOW (ref 150–400)
RBC: 2.03 MIL/uL — ABNORMAL LOW (ref 3.87–5.11)
RDW: 16.9 % — ABNORMAL HIGH (ref 11.5–15.5)
WBC Count: 12 10*3/uL — ABNORMAL HIGH (ref 4.0–10.5)
nRBC: 0 % (ref 0.0–0.2)
nRBC: 0 /100{WBCs}

## 2023-04-22 LAB — CMP (CANCER CENTER ONLY)
ALT: 7 U/L (ref 0–44)
AST: 21 U/L (ref 15–41)
Albumin: 3.1 g/dL — ABNORMAL LOW (ref 3.5–5.0)
Alkaline Phosphatase: 78 U/L (ref 38–126)
Anion gap: 14 (ref 5–15)
BUN: 26 mg/dL — ABNORMAL HIGH (ref 8–23)
CO2: 24 mmol/L (ref 22–32)
Calcium: 8.5 mg/dL — ABNORMAL LOW (ref 8.9–10.3)
Chloride: 91 mmol/L — ABNORMAL LOW (ref 98–111)
Creatinine: 1.22 mg/dL — ABNORMAL HIGH (ref 0.44–1.00)
GFR, Estimated: 44 mL/min — ABNORMAL LOW (ref >60)
Glucose, Bld: 188 mg/dL — ABNORMAL HIGH (ref 70–99)
Potassium: 2.8 mmol/L — ABNORMAL LOW (ref 3.5–5.1)
Sodium: 129 mmol/L — ABNORMAL LOW (ref 135–145)
Total Bilirubin: 1.1 mg/dL (ref 0.0–1.2)
Total Protein: 6.4 g/dL — ABNORMAL LOW (ref 6.5–8.1)

## 2023-04-22 LAB — IRON AND TIBC
Iron: 91 ug/dL (ref 28–170)
Saturation Ratios: 72 % — ABNORMAL HIGH (ref 10.4–31.8)
TIBC: 126 ug/dL — ABNORMAL LOW (ref 250–450)
UIBC: 35 ug/dL

## 2023-04-22 LAB — SAMPLE TO BLOOD BANK

## 2023-04-22 LAB — FERRITIN: Ferritin: 1727 ng/mL — ABNORMAL HIGH (ref 11–307)

## 2023-04-22 LAB — FOLATE: Folate: 2.5 ng/mL — ABNORMAL LOW (ref >5.9)

## 2023-04-22 LAB — VITAMIN B12: Vitamin B-12: 689 pg/mL (ref 180–914)

## 2023-04-22 LAB — PREPARE RBC (CROSSMATCH)

## 2023-04-22 NOTE — Progress Notes (Unsigned)
 CRITICAL VALUE STICKER  CRITICAL VALUE:  Hgb 6.0  RECEIVER (on-site recipient of call):  Shelbie Dess RN  DATE & TIME NOTIFIED:   04/22/2023 @ 1605  MESSENGER (representative from lab):  [facility] lab  MD NOTIFIED:   Dr. Harles Lied  TIME OF NOTIFICATION:  1615  RESPONSE:  Transfuse two units PRBC 04/23/2023 @ 1100

## 2023-04-22 NOTE — Progress Notes (Signed)
 Done

## 2023-04-23 ENCOUNTER — Encounter: Payer: Self-pay | Admitting: Oncology

## 2023-04-23 ENCOUNTER — Inpatient Hospital Stay

## 2023-04-23 DIAGNOSIS — E538 Deficiency of other specified B group vitamins: Secondary | ICD-10-CM | POA: Diagnosis not present

## 2023-04-23 DIAGNOSIS — E611 Iron deficiency: Secondary | ICD-10-CM | POA: Diagnosis not present

## 2023-04-23 DIAGNOSIS — D638 Anemia in other chronic diseases classified elsewhere: Secondary | ICD-10-CM | POA: Diagnosis not present

## 2023-04-23 DIAGNOSIS — M069 Rheumatoid arthritis, unspecified: Secondary | ICD-10-CM | POA: Diagnosis not present

## 2023-04-23 DIAGNOSIS — Z79631 Long term (current) use of antimetabolite agent: Secondary | ICD-10-CM | POA: Diagnosis not present

## 2023-04-23 NOTE — Progress Notes (Signed)
 Pt's son here to pick her up.  Son notified that pt's original iv site in right forearm infiltrated and has a goose egg hematoma which should reabsorb into her skin.  Son verbalizes understanding.

## 2023-04-23 NOTE — Patient Instructions (Signed)

## 2023-04-24 ENCOUNTER — Telehealth: Payer: Self-pay

## 2023-04-24 LAB — BPAM RBC
Blood Product Expiration Date: 202505222359
Blood Product Unit Number: 202505222359
ISSUE DATE / TIME: 202504221015
PRODUCT CODE: 202504221015
PRODUCT CODE: 202505222359
Unit Type and Rh: 5100
Unit Type and Rh: 202505222359
Unit Type and Rh: 5100
Unit Type and Rh: 5100

## 2023-04-24 LAB — TYPE AND SCREEN
ABO/RH(D): O POS
Antibody Screen: NEGATIVE
Unit division: 0
Unit division: 0

## 2023-04-24 NOTE — Telephone Encounter (Signed)
 Dr Harles Lied: please contact pt and tell her to start taking Folic Acid  at a minimum of 1000 mcg daily. This may be the possible reason behind her low counts. thx

## 2023-04-27 DIAGNOSIS — E860 Dehydration: Secondary | ICD-10-CM | POA: Diagnosis not present

## 2023-04-27 DIAGNOSIS — J029 Acute pharyngitis, unspecified: Secondary | ICD-10-CM | POA: Diagnosis not present

## 2023-04-27 DIAGNOSIS — D61818 Other pancytopenia: Secondary | ICD-10-CM | POA: Diagnosis not present

## 2023-04-27 DIAGNOSIS — D649 Anemia, unspecified: Secondary | ICD-10-CM | POA: Diagnosis not present

## 2023-04-27 DIAGNOSIS — E0590 Thyrotoxicosis, unspecified without thyrotoxic crisis or storm: Secondary | ICD-10-CM | POA: Diagnosis not present

## 2023-04-27 DIAGNOSIS — Z79899 Other long term (current) drug therapy: Secondary | ICD-10-CM | POA: Diagnosis not present

## 2023-04-27 DIAGNOSIS — R2242 Localized swelling, mass and lump, left lower limb: Secondary | ICD-10-CM | POA: Diagnosis not present

## 2023-04-27 DIAGNOSIS — R6889 Other general symptoms and signs: Secondary | ICD-10-CM | POA: Diagnosis not present

## 2023-04-27 DIAGNOSIS — Z79631 Long term (current) use of antimetabolite agent: Secondary | ICD-10-CM | POA: Diagnosis not present

## 2023-04-27 DIAGNOSIS — D696 Thrombocytopenia, unspecified: Secondary | ICD-10-CM | POA: Diagnosis not present

## 2023-04-27 DIAGNOSIS — M069 Rheumatoid arthritis, unspecified: Secondary | ICD-10-CM | POA: Diagnosis not present

## 2023-04-27 DIAGNOSIS — C8590 Non-Hodgkin lymphoma, unspecified, unspecified site: Secondary | ICD-10-CM | POA: Diagnosis not present

## 2023-04-27 DIAGNOSIS — E876 Hypokalemia: Secondary | ICD-10-CM | POA: Diagnosis not present

## 2023-04-27 DIAGNOSIS — I509 Heart failure, unspecified: Secondary | ICD-10-CM | POA: Diagnosis not present

## 2023-04-27 DIAGNOSIS — R070 Pain in throat: Secondary | ICD-10-CM | POA: Diagnosis not present

## 2023-04-27 DIAGNOSIS — I110 Hypertensive heart disease with heart failure: Secondary | ICD-10-CM | POA: Diagnosis not present

## 2023-04-27 DIAGNOSIS — T451X4A Poisoning by antineoplastic and immunosuppressive drugs, undetermined, initial encounter: Secondary | ICD-10-CM | POA: Diagnosis not present

## 2023-04-27 DIAGNOSIS — D72819 Decreased white blood cell count, unspecified: Secondary | ICD-10-CM | POA: Diagnosis not present

## 2023-04-28 DIAGNOSIS — R634 Abnormal weight loss: Secondary | ICD-10-CM | POA: Diagnosis not present

## 2023-04-28 DIAGNOSIS — I110 Hypertensive heart disease with heart failure: Secondary | ICD-10-CM | POA: Diagnosis not present

## 2023-04-28 DIAGNOSIS — R29725 NIHSS score 25: Secondary | ICD-10-CM | POA: Diagnosis not present

## 2023-04-28 DIAGNOSIS — R918 Other nonspecific abnormal finding of lung field: Secondary | ICD-10-CM | POA: Diagnosis not present

## 2023-04-28 DIAGNOSIS — E874 Mixed disorder of acid-base balance: Secondary | ICD-10-CM | POA: Diagnosis not present

## 2023-04-28 DIAGNOSIS — Z4682 Encounter for fitting and adjustment of non-vascular catheter: Secondary | ICD-10-CM | POA: Diagnosis not present

## 2023-04-28 DIAGNOSIS — R627 Adult failure to thrive: Secondary | ICD-10-CM | POA: Diagnosis not present

## 2023-04-28 DIAGNOSIS — I4891 Unspecified atrial fibrillation: Secondary | ICD-10-CM | POA: Diagnosis not present

## 2023-04-28 DIAGNOSIS — R400 Somnolence: Secondary | ICD-10-CM | POA: Diagnosis not present

## 2023-04-28 DIAGNOSIS — R402434 Glasgow coma scale score 3-8, 24 hours or more after hospital admission: Secondary | ICD-10-CM | POA: Diagnosis not present

## 2023-04-28 DIAGNOSIS — K5730 Diverticulosis of large intestine without perforation or abscess without bleeding: Secondary | ICD-10-CM | POA: Diagnosis not present

## 2023-04-28 DIAGNOSIS — I083 Combined rheumatic disorders of mitral, aortic and tricuspid valves: Secondary | ICD-10-CM | POA: Diagnosis not present

## 2023-04-28 DIAGNOSIS — D61811 Other drug-induced pancytopenia: Secondary | ICD-10-CM | POA: Diagnosis not present

## 2023-04-28 DIAGNOSIS — E870 Hyperosmolality and hypernatremia: Secondary | ICD-10-CM | POA: Diagnosis not present

## 2023-04-28 DIAGNOSIS — J9601 Acute respiratory failure with hypoxia: Secondary | ICD-10-CM | POA: Diagnosis not present

## 2023-04-28 DIAGNOSIS — A419 Sepsis, unspecified organism: Secondary | ICD-10-CM | POA: Diagnosis not present

## 2023-04-28 DIAGNOSIS — R579 Shock, unspecified: Secondary | ICD-10-CM | POA: Diagnosis not present

## 2023-04-28 DIAGNOSIS — E43 Unspecified severe protein-calorie malnutrition: Secondary | ICD-10-CM | POA: Diagnosis not present

## 2023-04-28 DIAGNOSIS — M069 Rheumatoid arthritis, unspecified: Secondary | ICD-10-CM | POA: Diagnosis not present

## 2023-04-28 DIAGNOSIS — I272 Pulmonary hypertension, unspecified: Secondary | ICD-10-CM | POA: Diagnosis not present

## 2023-04-28 DIAGNOSIS — R6521 Severe sepsis with septic shock: Secondary | ICD-10-CM | POA: Diagnosis not present

## 2023-04-28 DIAGNOSIS — G9340 Encephalopathy, unspecified: Secondary | ICD-10-CM | POA: Diagnosis not present

## 2023-04-28 DIAGNOSIS — J8283 Eosinophilic asthma: Secondary | ICD-10-CM | POA: Diagnosis not present

## 2023-04-28 DIAGNOSIS — I472 Ventricular tachycardia, unspecified: Secondary | ICD-10-CM | POA: Diagnosis not present

## 2023-04-28 DIAGNOSIS — D61818 Other pancytopenia: Secondary | ICD-10-CM | POA: Diagnosis not present

## 2023-04-28 DIAGNOSIS — R239 Unspecified skin changes: Secondary | ICD-10-CM | POA: Diagnosis not present

## 2023-04-28 DIAGNOSIS — I5030 Unspecified diastolic (congestive) heart failure: Secondary | ICD-10-CM | POA: Diagnosis not present

## 2023-04-28 DIAGNOSIS — J90 Pleural effusion, not elsewhere classified: Secondary | ICD-10-CM | POA: Diagnosis not present

## 2023-04-28 DIAGNOSIS — I4510 Unspecified right bundle-branch block: Secondary | ICD-10-CM | POA: Diagnosis not present

## 2023-04-28 DIAGNOSIS — I63512 Cerebral infarction due to unspecified occlusion or stenosis of left middle cerebral artery: Secondary | ICD-10-CM | POA: Diagnosis not present

## 2023-04-28 DIAGNOSIS — T451X5A Adverse effect of antineoplastic and immunosuppressive drugs, initial encounter: Secondary | ICD-10-CM | POA: Diagnosis not present

## 2023-04-28 DIAGNOSIS — I480 Paroxysmal atrial fibrillation: Secondary | ICD-10-CM | POA: Diagnosis not present

## 2023-04-28 DIAGNOSIS — D638 Anemia in other chronic diseases classified elsewhere: Secondary | ICD-10-CM | POA: Diagnosis not present

## 2023-04-28 DIAGNOSIS — N179 Acute kidney failure, unspecified: Secondary | ICD-10-CM | POA: Diagnosis not present

## 2023-04-28 DIAGNOSIS — I6381 Other cerebral infarction due to occlusion or stenosis of small artery: Secondary | ICD-10-CM | POA: Diagnosis not present

## 2023-04-28 DIAGNOSIS — L03115 Cellulitis of right lower limb: Secondary | ICD-10-CM | POA: Diagnosis not present

## 2023-04-28 DIAGNOSIS — R578 Other shock: Secondary | ICD-10-CM | POA: Diagnosis not present

## 2023-04-28 DIAGNOSIS — I5082 Biventricular heart failure: Secondary | ICD-10-CM | POA: Diagnosis not present

## 2023-04-29 ENCOUNTER — Telehealth: Payer: Self-pay | Admitting: Oncology

## 2023-04-29 DIAGNOSIS — R402434 Glasgow coma scale score 3-8, 24 hours or more after hospital admission: Secondary | ICD-10-CM | POA: Diagnosis not present

## 2023-04-29 DIAGNOSIS — R627 Adult failure to thrive: Secondary | ICD-10-CM | POA: Diagnosis not present

## 2023-04-29 DIAGNOSIS — D61818 Other pancytopenia: Secondary | ICD-10-CM | POA: Diagnosis not present

## 2023-04-29 DIAGNOSIS — R579 Shock, unspecified: Secondary | ICD-10-CM | POA: Diagnosis not present

## 2023-04-29 DIAGNOSIS — R400 Somnolence: Secondary | ICD-10-CM | POA: Diagnosis not present

## 2023-04-29 DIAGNOSIS — Z4682 Encounter for fitting and adjustment of non-vascular catheter: Secondary | ICD-10-CM | POA: Diagnosis not present

## 2023-04-29 NOTE — Telephone Encounter (Signed)
 04/29/23 Appt cancelled-Patient is in the hospital.Will call back when discharged.

## 2023-04-30 DIAGNOSIS — E43 Unspecified severe protein-calorie malnutrition: Secondary | ICD-10-CM | POA: Diagnosis not present

## 2023-04-30 DIAGNOSIS — R239 Unspecified skin changes: Secondary | ICD-10-CM | POA: Diagnosis not present

## 2023-04-30 DIAGNOSIS — D61818 Other pancytopenia: Secondary | ICD-10-CM | POA: Diagnosis not present

## 2023-05-01 ENCOUNTER — Inpatient Hospital Stay: Admitting: Oncology

## 2023-05-01 ENCOUNTER — Inpatient Hospital Stay

## 2023-05-01 DIAGNOSIS — I4510 Unspecified right bundle-branch block: Secondary | ICD-10-CM | POA: Diagnosis not present

## 2023-05-01 DIAGNOSIS — I4891 Unspecified atrial fibrillation: Secondary | ICD-10-CM | POA: Diagnosis not present

## 2023-05-02 DEATH — deceased

## 2023-06-02 DEATH — deceased
